# Patient Record
Sex: Female | Born: 1935 | Race: White | Hispanic: No | Smoking: Never smoker
Health system: Southern US, Community
[De-identification: ages and names within clinical notes are randomized; demographics above are authoritative.]

## PROBLEM LIST (undated history)

## (undated) DIAGNOSIS — C801 Malignant (primary) neoplasm, unspecified: Secondary | ICD-10-CM

## (undated) DIAGNOSIS — R600 Localized edema: Secondary | ICD-10-CM

## (undated) DIAGNOSIS — I38 Endocarditis, valve unspecified: Secondary | ICD-10-CM

## (undated) DIAGNOSIS — I2510 Atherosclerotic heart disease of native coronary artery without angina pectoris: Secondary | ICD-10-CM

## (undated) DIAGNOSIS — M1990 Unspecified osteoarthritis, unspecified site: Secondary | ICD-10-CM

## (undated) DIAGNOSIS — N189 Chronic kidney disease, unspecified: Secondary | ICD-10-CM

## (undated) DIAGNOSIS — I219 Acute myocardial infarction, unspecified: Secondary | ICD-10-CM

## (undated) DIAGNOSIS — E039 Hypothyroidism, unspecified: Secondary | ICD-10-CM

## (undated) DIAGNOSIS — Z889 Allergy status to unspecified drugs, medicaments and biological substances status: Secondary | ICD-10-CM

## (undated) DIAGNOSIS — M810 Age-related osteoporosis without current pathological fracture: Secondary | ICD-10-CM

## (undated) DIAGNOSIS — R011 Cardiac murmur, unspecified: Secondary | ICD-10-CM

## (undated) DIAGNOSIS — K769 Liver disease, unspecified: Secondary | ICD-10-CM

## (undated) DIAGNOSIS — H9190 Unspecified hearing loss, unspecified ear: Secondary | ICD-10-CM

## (undated) DIAGNOSIS — I499 Cardiac arrhythmia, unspecified: Secondary | ICD-10-CM

## (undated) DIAGNOSIS — I10 Essential (primary) hypertension: Secondary | ICD-10-CM

## (undated) DIAGNOSIS — E7800 Pure hypercholesterolemia, unspecified: Secondary | ICD-10-CM

## (undated) DIAGNOSIS — J189 Pneumonia, unspecified organism: Secondary | ICD-10-CM

## (undated) HISTORY — PX: CHOLECYSTECTOMY: SHX55

## (undated) HISTORY — PX: KNEE ARTHROSCOPY: SUR90

## (undated) HISTORY — PX: ABDOMINAL HYSTERECTOMY: SHX81

## (undated) HISTORY — PX: THYROIDECTOMY: SHX17

---

## 2004-03-11 ENCOUNTER — Other Ambulatory Visit: Payer: Self-pay

## 2004-03-11 ENCOUNTER — Inpatient Hospital Stay: Payer: Self-pay | Admitting: Internal Medicine

## 2004-08-20 ENCOUNTER — Ambulatory Visit: Payer: Self-pay | Admitting: Internal Medicine

## 2004-10-02 ENCOUNTER — Ambulatory Visit: Payer: Self-pay | Admitting: Gastroenterology

## 2004-11-13 ENCOUNTER — Emergency Department: Payer: Self-pay | Admitting: Emergency Medicine

## 2004-11-14 ENCOUNTER — Emergency Department: Payer: Self-pay | Admitting: Internal Medicine

## 2004-11-14 ENCOUNTER — Other Ambulatory Visit: Payer: Self-pay

## 2004-11-15 ENCOUNTER — Other Ambulatory Visit: Payer: Self-pay

## 2004-11-15 ENCOUNTER — Emergency Department: Payer: Self-pay | Admitting: Emergency Medicine

## 2005-03-25 ENCOUNTER — Ambulatory Visit: Payer: Self-pay

## 2005-09-08 ENCOUNTER — Ambulatory Visit: Payer: Self-pay | Admitting: Internal Medicine

## 2005-09-18 ENCOUNTER — Ambulatory Visit: Payer: Self-pay | Admitting: General Practice

## 2006-02-02 ENCOUNTER — Ambulatory Visit: Payer: Self-pay | Admitting: Internal Medicine

## 2006-03-26 ENCOUNTER — Ambulatory Visit: Payer: Self-pay | Admitting: General Surgery

## 2006-03-26 ENCOUNTER — Other Ambulatory Visit: Payer: Self-pay

## 2006-04-02 ENCOUNTER — Ambulatory Visit: Payer: Self-pay | Admitting: General Surgery

## 2006-07-27 ENCOUNTER — Other Ambulatory Visit: Payer: Self-pay

## 2006-07-27 ENCOUNTER — Emergency Department: Payer: Self-pay | Admitting: Emergency Medicine

## 2006-08-16 ENCOUNTER — Ambulatory Visit: Payer: Self-pay | Admitting: Internal Medicine

## 2006-09-28 ENCOUNTER — Ambulatory Visit: Payer: Self-pay | Admitting: Internal Medicine

## 2007-11-03 ENCOUNTER — Ambulatory Visit: Payer: Self-pay | Admitting: Internal Medicine

## 2007-11-30 ENCOUNTER — Emergency Department: Payer: Self-pay | Admitting: Emergency Medicine

## 2007-11-30 ENCOUNTER — Other Ambulatory Visit: Payer: Self-pay

## 2008-03-27 ENCOUNTER — Ambulatory Visit: Payer: Self-pay | Admitting: Internal Medicine

## 2008-06-11 ENCOUNTER — Emergency Department: Payer: Self-pay | Admitting: Emergency Medicine

## 2008-11-08 ENCOUNTER — Ambulatory Visit: Payer: Self-pay | Admitting: Internal Medicine

## 2009-06-12 ENCOUNTER — Ambulatory Visit: Payer: Self-pay | Admitting: Internal Medicine

## 2009-11-27 ENCOUNTER — Ambulatory Visit: Payer: Self-pay | Admitting: Internal Medicine

## 2010-11-28 ENCOUNTER — Ambulatory Visit: Payer: Self-pay | Admitting: Internal Medicine

## 2011-01-27 ENCOUNTER — Ambulatory Visit: Payer: Self-pay | Admitting: Internal Medicine

## 2011-03-05 ENCOUNTER — Observation Stay: Payer: Self-pay | Admitting: Internal Medicine

## 2012-01-28 ENCOUNTER — Ambulatory Visit: Payer: Self-pay | Admitting: Internal Medicine

## 2012-12-09 ENCOUNTER — Emergency Department: Payer: Self-pay | Admitting: Emergency Medicine

## 2012-12-09 LAB — COMPREHENSIVE METABOLIC PANEL
Alkaline Phosphatase: 111 U/L (ref 50–136)
Anion Gap: 7 (ref 7–16)
BUN: 12 mg/dL (ref 7–18)
Bilirubin,Total: 1.8 mg/dL — ABNORMAL HIGH (ref 0.2–1.0)
Calcium, Total: 9.4 mg/dL (ref 8.5–10.1)
Creatinine: 1.11 mg/dL (ref 0.60–1.30)
EGFR (African American): 55 — ABNORMAL LOW
Glucose: 105 mg/dL — ABNORMAL HIGH (ref 65–99)
Osmolality: 276 (ref 275–301)
Potassium: 3.9 mmol/L (ref 3.5–5.1)
SGOT(AST): 97 U/L — ABNORMAL HIGH (ref 15–37)
Sodium: 138 mmol/L (ref 136–145)
Total Protein: 7.9 g/dL (ref 6.4–8.2)

## 2012-12-09 LAB — CBC
HGB: 13.7 g/dL (ref 12.0–16.0)
MCH: 35 pg — ABNORMAL HIGH (ref 26.0–34.0)
MCHC: 34.2 g/dL (ref 32.0–36.0)
RBC: 3.92 10*6/uL (ref 3.80–5.20)
RDW: 14.6 % — ABNORMAL HIGH (ref 11.5–14.5)
WBC: 6.2 10*3/uL (ref 3.6–11.0)

## 2013-01-11 ENCOUNTER — Ambulatory Visit: Payer: Self-pay | Admitting: Internal Medicine

## 2013-01-30 ENCOUNTER — Ambulatory Visit: Payer: Self-pay | Admitting: Internal Medicine

## 2013-01-31 ENCOUNTER — Ambulatory Visit: Payer: Self-pay | Admitting: Internal Medicine

## 2014-07-18 ENCOUNTER — Emergency Department
Admit: 2014-07-18 | Disposition: A | Discharge status: Home / Self Care | Payer: Self-pay | Admitting: Emergency Medicine

## 2014-07-24 ENCOUNTER — Ambulatory Visit
Admit: 2014-07-24 | Disposition: A | Discharge status: Home / Self Care | Payer: Self-pay | Attending: Physician Assistant | Admitting: Physician Assistant

## 2015-01-08 ENCOUNTER — Other Ambulatory Visit: Payer: Self-pay | Admitting: Internal Medicine

## 2015-01-08 DIAGNOSIS — Z1231 Encounter for screening mammogram for malignant neoplasm of breast: Secondary | ICD-10-CM

## 2015-01-17 ENCOUNTER — Ambulatory Visit
Admission: RE | Admit: 2015-01-17 | Discharge: 2015-01-17 | Disposition: A | Discharge status: Home / Self Care | Payer: Medicare Other | Source: Ambulatory Visit | Attending: Internal Medicine | Admitting: Internal Medicine

## 2015-01-17 ENCOUNTER — Other Ambulatory Visit: Payer: Self-pay | Admitting: Internal Medicine

## 2015-01-17 DIAGNOSIS — Z1231 Encounter for screening mammogram for malignant neoplasm of breast: Secondary | ICD-10-CM

## 2015-09-06 ENCOUNTER — Other Ambulatory Visit
Admission: RE | Admit: 2015-09-06 | Discharge: 2015-09-06 | Disposition: A | Discharge status: Home / Self Care | Payer: Medicare Other | Source: Ambulatory Visit | Attending: Ophthalmology | Admitting: Ophthalmology

## 2015-09-09 ENCOUNTER — Encounter: Payer: Self-pay | Admitting: *Deleted

## 2015-09-09 LAB — MISC LABCORP TEST (SEND OUT): LABCORP TEST CODE: 602669

## 2015-09-10 NOTE — Pre-Procedure Instructions (Signed)
NEGATIVE RAST TEST. OR NOTIFIED

## 2015-09-12 ENCOUNTER — Ambulatory Visit
Admission: RE | Admit: 2015-09-12 | Discharge: 2015-09-12 | Disposition: A | Discharge status: Home / Self Care | Payer: Medicare Other | Source: Ambulatory Visit | Attending: Ophthalmology | Admitting: Ophthalmology

## 2015-09-12 ENCOUNTER — Ambulatory Visit: Payer: Medicare Other | Admitting: Anesthesiology

## 2015-09-12 ENCOUNTER — Encounter: Payer: Self-pay | Admitting: *Deleted

## 2015-09-12 ENCOUNTER — Encounter
Admission: RE | Disposition: A | Discharge status: Home / Self Care | Payer: Self-pay | Source: Ambulatory Visit | Attending: Ophthalmology

## 2015-09-12 DIAGNOSIS — I10 Essential (primary) hypertension: Secondary | ICD-10-CM | POA: Insufficient documentation

## 2015-09-12 DIAGNOSIS — M810 Age-related osteoporosis without current pathological fracture: Secondary | ICD-10-CM | POA: Diagnosis not present

## 2015-09-12 DIAGNOSIS — H2512 Age-related nuclear cataract, left eye: Secondary | ICD-10-CM | POA: Insufficient documentation

## 2015-09-12 DIAGNOSIS — E7800 Pure hypercholesterolemia, unspecified: Secondary | ICD-10-CM | POA: Insufficient documentation

## 2015-09-12 DIAGNOSIS — H9190 Unspecified hearing loss, unspecified ear: Secondary | ICD-10-CM | POA: Insufficient documentation

## 2015-09-12 DIAGNOSIS — E039 Hypothyroidism, unspecified: Secondary | ICD-10-CM | POA: Insufficient documentation

## 2015-09-12 DIAGNOSIS — I252 Old myocardial infarction: Secondary | ICD-10-CM | POA: Diagnosis not present

## 2015-09-12 HISTORY — DX: Pure hypercholesterolemia, unspecified: E78.00

## 2015-09-12 HISTORY — DX: Allergy status to unspecified drugs, medicaments and biological substances: Z88.9

## 2015-09-12 HISTORY — DX: Pneumonia, unspecified organism: J18.9

## 2015-09-12 HISTORY — DX: Endocarditis, valve unspecified: I38

## 2015-09-12 HISTORY — DX: Essential (primary) hypertension: I10

## 2015-09-12 HISTORY — DX: Cardiac arrhythmia, unspecified: I49.9

## 2015-09-12 HISTORY — DX: Liver disease, unspecified: K76.9

## 2015-09-12 HISTORY — DX: Unspecified hearing loss, unspecified ear: H91.90

## 2015-09-12 HISTORY — PX: CATARACT EXTRACTION W/PHACO: SHX586

## 2015-09-12 HISTORY — DX: Acute myocardial infarction, unspecified: I21.9

## 2015-09-12 HISTORY — DX: Age-related osteoporosis without current pathological fracture: M81.0

## 2015-09-12 HISTORY — DX: Hypothyroidism, unspecified: E03.9

## 2015-09-12 SURGERY — PHACOEMULSIFICATION, CATARACT, WITH IOL INSERTION
Anesthesia: Monitor Anesthesia Care | Site: Eye | Laterality: Left | Wound class: Clean

## 2015-09-12 MED ORDER — NA CHONDROIT SULF-NA HYALURON 40-17 MG/ML IO SOLN
INTRAOCULAR | Status: DC | PRN
  Administered 2015-09-12: 1 mL via INTRAOCULAR

## 2015-09-12 MED ORDER — LIDOCAINE HCL 2 % EX GEL
1.0000 "application " | Freq: Once | CUTANEOUS | Status: AC
  Administered 2015-09-12: 1

## 2015-09-12 MED ORDER — FENTANYL CITRATE (PF) 100 MCG/2ML IJ SOLN
INTRAMUSCULAR | Status: DC | PRN
  Administered 2015-09-12 (×2): 25 ug via INTRAVENOUS

## 2015-09-12 MED ORDER — PHENYLEPHRINE HCL 10 % OP SOLN
OPHTHALMIC | Status: AC
  Administered 2015-09-12: 1 [drp] via OPHTHALMIC
  Filled 2015-09-12: qty 5

## 2015-09-12 MED ORDER — MIDAZOLAM HCL 2 MG/2ML IJ SOLN
INTRAMUSCULAR | Status: DC | PRN
  Administered 2015-09-12: 1 mg via INTRAVENOUS

## 2015-09-12 MED ORDER — TETRACAINE HCL 0.5 % OP SOLN
1.0000 [drp] | Freq: Once | OPHTHALMIC | Status: AC
  Administered 2015-09-12: 1 [drp] via OPHTHALMIC

## 2015-09-12 MED ORDER — NA HYALUR & NA CHOND-NA HYALUR 0.55-0.5 ML IO KIT
PACK | INTRAOCULAR | Status: AC
  Filled 2015-09-12: qty 1.05

## 2015-09-12 MED ORDER — CEFUROXIME OPHTHALMIC INJECTION 1 MG/0.1 ML
INJECTION | OPHTHALMIC | Status: AC
  Filled 2015-09-12: qty 0.1

## 2015-09-12 MED ORDER — POLYMYXIN B-TRIMETHOPRIM 10000-0.1 UNIT/ML-% OP SOLN
OPHTHALMIC | Status: AC
  Administered 2015-09-12: 1 [drp] via OPHTHALMIC
  Filled 2015-09-12: qty 10

## 2015-09-12 MED ORDER — SODIUM HYALURONATE 23 MG/ML IO SOLN
INTRAOCULAR | Status: AC
  Filled 2015-09-12: qty 0.6

## 2015-09-12 MED ORDER — EPINEPHRINE HCL 1 MG/ML IJ SOLN
INTRAOCULAR | Status: DC | PRN
  Administered 2015-09-12: 250 mL via OPHTHALMIC

## 2015-09-12 MED ORDER — EPINEPHRINE HCL 1 MG/ML IJ SOLN
INTRAMUSCULAR | Status: AC
  Filled 2015-09-12: qty 1

## 2015-09-12 MED ORDER — POLYMYXIN B-TRIMETHOPRIM 10000-0.1 UNIT/ML-% OP SOLN
1.0000 [drp] | OPHTHALMIC | Status: AC
  Administered 2015-09-12 (×3): 1 [drp] via OPHTHALMIC

## 2015-09-12 MED ORDER — CYCLOPENTOLATE HCL 2 % OP SOLN
OPHTHALMIC | Status: AC
  Administered 2015-09-12: 1 [drp] via OPHTHALMIC
  Filled 2015-09-12: qty 2

## 2015-09-12 MED ORDER — SODIUM HYALURONATE 10 MG/ML IO SOLN
INTRAOCULAR | Status: DC | PRN
  Administered 2015-09-12: 0.85 mL via INTRAOCULAR

## 2015-09-12 MED ORDER — LIDOCAINE HCL (PF) 4 % IJ SOLN
INTRAMUSCULAR | Status: AC
Start: 2015-09-12 — End: 2015-09-12
  Filled 2015-09-12: qty 5

## 2015-09-12 MED ORDER — SODIUM HYALURONATE 10 MG/ML IO SOLN
INTRAOCULAR | Status: AC
  Filled 2015-09-12: qty 0.85

## 2015-09-12 MED ORDER — SODIUM HYALURONATE 23 MG/ML IO SOLN
INTRAOCULAR | Status: DC | PRN
  Administered 2015-09-12: 0.6 mL via INTRAOCULAR

## 2015-09-12 MED ORDER — LIDOCAINE HCL 3.5 % OP GEL
OPHTHALMIC | Status: AC
  Administered 2015-09-12: 09:00:00
  Filled 2015-09-12: qty 1

## 2015-09-12 MED ORDER — LIDOCAINE HCL (PF) 4 % IJ SOLN
INTRAOCULAR | Status: DC | PRN
  Administered 2015-09-12: 4 mL via OPHTHALMIC

## 2015-09-12 MED ORDER — SODIUM CHLORIDE 0.9 % IV SOLN
INTRAVENOUS | Status: DC
  Administered 2015-09-12: 08:00:00 via INTRAVENOUS

## 2015-09-12 MED ORDER — POVIDONE-IODINE 5 % OP SOLN
OPHTHALMIC | Status: AC
  Administered 2015-09-12: 1 via OPHTHALMIC
  Filled 2015-09-12: qty 30

## 2015-09-12 MED ORDER — POLYMYXIN B-TRIMETHOPRIM 10000-0.1 UNIT/ML-% OP SOLN
OPHTHALMIC | Status: DC | PRN
  Administered 2015-09-12: 2 [drp] via OPHTHALMIC

## 2015-09-12 MED ORDER — PHENYLEPHRINE HCL 10 % OP SOLN
1.0000 [drp] | OPHTHALMIC | Status: AC
  Administered 2015-09-12 (×4): 1 [drp] via OPHTHALMIC

## 2015-09-12 MED ORDER — CYCLOPENTOLATE HCL 2 % OP SOLN
1.0000 [drp] | OPHTHALMIC | Status: AC
  Administered 2015-09-12 (×4): 1 [drp] via OPHTHALMIC

## 2015-09-12 MED ORDER — POVIDONE-IODINE 5 % OP SOLN
1.0000 "application " | Freq: Once | OPHTHALMIC | Status: AC
  Administered 2015-09-12: 1 via OPHTHALMIC

## 2015-09-12 MED ORDER — TETRACAINE HCL 0.5 % OP SOLN
OPHTHALMIC | Status: AC
  Administered 2015-09-12: 1 [drp] via OPHTHALMIC
  Filled 2015-09-12: qty 2

## 2015-09-12 SURGICAL SUPPLY — 23 items
CANNULA ANT/CHMB 27G (MISCELLANEOUS) ×1 IMPLANT
CANNULA ANT/CHMB 27GA (MISCELLANEOUS) ×3 IMPLANT
CUP MEDICINE 2OZ PLAST GRAD ST (MISCELLANEOUS) ×3 IMPLANT
GLOVE BIO SURGEON STRL SZ8 (GLOVE) ×3 IMPLANT
GLOVE BIOGEL M 6.5 STRL (GLOVE) ×3 IMPLANT
GLOVE SURG LX 7.5 STRW (GLOVE) ×2
GLOVE SURG LX STRL 7.5 STRW (GLOVE) ×1 IMPLANT
GOWN STRL REUS W/ TWL LRG LVL3 (GOWN DISPOSABLE) ×2 IMPLANT
GOWN STRL REUS W/TWL LRG LVL3 (GOWN DISPOSABLE) ×6
LENS IOL ACRSF IQ PC 23.5 (Intraocular Lens) IMPLANT
LENS IOL ACRYSOF IQ POST 23.5 (Intraocular Lens) ×3 IMPLANT
PACK CATARACT (MISCELLANEOUS) ×3 IMPLANT
PACK CATARACT BRASINGTON LX (MISCELLANEOUS) ×3 IMPLANT
PACK EYE AFTER SURG (MISCELLANEOUS) ×3 IMPLANT
SOL BSS BAG (MISCELLANEOUS) ×3
SOL PREP PVP 2OZ (MISCELLANEOUS) ×3
SOLUTION BSS BAG (MISCELLANEOUS) ×1 IMPLANT
SOLUTION PREP PVP 2OZ (MISCELLANEOUS) ×1 IMPLANT
SYR 3ML LL SCALE MARK (SYRINGE) ×3 IMPLANT
SYR 5ML LL (SYRINGE) ×3 IMPLANT
SYR TB 1ML 27GX1/2 LL (SYRINGE) ×3 IMPLANT
WATER STERILE IRR 1000ML POUR (IV SOLUTION) ×3 IMPLANT
WIPE NON LINTING 3.25X3.25 (MISCELLANEOUS) ×3 IMPLANT

## 2015-09-12 NOTE — H&P (Signed)
  The History and Physical notes are on paper, have been signed, and are to be scanned. The patient remains stable and unchanged from the H&P.   Previous H&P reviewed, patient examined, and there are no changes.  Desiree Koch 09/12/2015 9:05 AM

## 2015-09-12 NOTE — Discharge Instructions (Signed)
Eye Surgery Discharge Instructions  Expect mild scratchy sensation or mild soreness. DO NOT RUB YOUR EYE!  The day of surgery:  Minimal physical activity, but bed rest is not required  No reading, computer work, or close hand work  No bending, lifting, or straining.  May watch TV  For 24 hours:  No driving, legal decisions, or alcoholic beverages  Safety precautions  Eat anything you prefer: It is better to start with liquids, then soup then solid foods.  _____ Eye patch should be worn until postoperative exam tomorrow.  ____ Solar shield eyeglasses should be worn for comfort in the sunlight/patch while sleeping  Resume all regular medications including aspirin or Coumadin if these were discontinued prior to surgery. You may shower, bathe, shave, or wash your hair. Tylenol may be taken for mild discomfort.  Call your doctor if you experience significant pain, nausea, or vomiting, fever > 101 or other signs of infection. [phone] or [phone] Specific instructions:  Follow-up Information    Follow up with Benay Pillow, MD In 1 day.   Specialty:  Ophthalmology   Why:  June 16 at 10:30am   Contact information:   [address] [place] [address] [phone]

## 2015-09-12 NOTE — Anesthesia Postprocedure Evaluation (Signed)
Anesthesia Post Note  Patient: BREALYN FOERST  Procedure(s) Performed: Procedure(s) (LRB): CATARACT EXTRACTION PHACO AND INTRAOCULAR LENS PLACEMENT (IOC) (Left)  Patient location during evaluation: PACU Anesthesia Type: MAC Level of consciousness: awake and alert Pain management: pain level controlled Vital Signs Assessment: post-procedure vital signs reviewed and stable Respiratory status: spontaneous breathing, nonlabored ventilation, respiratory function stable and patient connected to nasal cannula oxygen Cardiovascular status: blood pressure returned to baseline and stable Postop Assessment: no signs of nausea or vomiting Anesthetic complications: no    Last Vitals:  Filed Vitals:   09/12/15 0953 09/12/15 0955  BP: 170/64 170/64  Pulse: 45   Temp: 36.6 C   Resp: 18     Last Pain:  Filed Vitals:   09/12/15 1012  PainSc: 2                  Martha Clan

## 2015-09-12 NOTE — Anesthesia Preprocedure Evaluation (Signed)
Anesthesia Evaluation  Patient identified by MRN, date of birth, ID band Patient awake    Reviewed: Allergy & Precautions, H&P , NPO status , Patient's Chart, lab work & pertinent test results, reviewed documented beta blocker date and time   History of Anesthesia Complications Negative for: history of anesthetic complications  Airway Mallampati: II  TM Distance: >3 FB Neck ROM: full    Dental no notable dental hx. (+) Edentulous Upper, Upper Dentures   Pulmonary neg pulmonary ROS,    Pulmonary exam normal breath sounds clear to auscultation       Cardiovascular Exercise Tolerance: Good hypertension, (-) angina+ CAD, + Past MI and +CHF  (-) Cardiac Stents and (-) CABG Normal cardiovascular exam+ dysrhythmias (LBBB) Atrial Fibrillation + Valvular Problems/Murmurs AI  Rhythm:regular Rate:Normal     Neuro/Psych negative neurological ROS  negative psych ROS   GI/Hepatic negative GI ROS, (+) Cirrhosis       ,   Endo/Other  neg diabetesHypothyroidism   Renal/GU negative Renal ROS  negative genitourinary   Musculoskeletal   Abdominal   Peds  Hematology negative hematology ROS (+)   Anesthesia Other Findings Past Medical History:   Pneumonia                                                    Cardiac arrhythmia                                           Hypertension                                                 Myocardial infarction ([place])                                  Leaky heart valve                                            Liver damage                                                 Multiple allergies                                           HOH (hard of hearing)                                        Hypercholesteremia  Osteoporosis                                                 Hypothyroidism                                                Reproductive/Obstetrics negative OB ROS                             Anesthesia Physical Anesthesia Plan  ASA: III  Anesthesia Plan: MAC   Post-op Pain Management:    Induction:   Airway Management Planned:   Additional Equipment:   Intra-op Plan:   Post-operative Plan:   Informed Consent: I have reviewed the patients History and Physical, chart, labs and discussed the procedure including the risks, benefits and alternatives for the proposed anesthesia with the patient or authorized representative who has indicated his/her understanding and acceptance.   Dental Advisory Given  Plan Discussed with: Anesthesiologist, CRNA and Surgeon  Anesthesia Plan Comments:         Anesthesia Quick Evaluation

## 2015-09-12 NOTE — Op Note (Signed)
OPERATIVE NOTE  Desiree Koch [id number] 09/12/2015   PREOPERATIVE DIAGNOSIS:  Nuclear sclerotic cataract left eye.  H25.12   POSTOPERATIVE DIAGNOSIS:    Nuclear sclerotic cataract left eye.     PROCEDURE:  Phacoemusification with posterior chamber intraocular lens placement of the left eye   LENS:   Implant Name Type Inv. Item Serial No. Manufacturer Lot No. LRB No. Used  IMPLANT LENS - [id number] Intraocular Lens IMPLANT LENS [id number] ALCON   Left 1       [id number] 23.5   ULTRASOUND TIME: 1 minutes 02 seconds.  CDE 9.45   SURGEON:  Benay Pillow, MD, MPH   ANESTHESIA:  Topical with tetracaine drops and 2% Xylocaine jelly, augmented with 1% preservative-free intracameral lidocaine.   COMPLICATIONS:  None.  This procedure was latex free per the patient request, despite a negative RAS test.   DESCRIPTION OF PROCEDURE:  The patient was identified in the holding room and transported to the operating room and placed in the supine position under the operating microscope.  The left eye was identified as the operative eye and it was prepped and draped in the usual sterile ophthalmic fashion.   A 1.0 millimeter clear-corneal paracentesis was made at the 5:00 position. 0.5 ml of preservative-free 1% lidocaine with epinephrine was injected into the anterior chamber.  The anterior chamber was filled with Healon 5 viscoelastic.  A 2.4 millimeter keratome was used to make a near-clear corneal incision at the 2:00 position.  A curvilinear capsulorrhexis was made with a cystotome and capsulorrhexis forceps.  Balanced salt solution was used to hydrodissect and hydrodelineate the nucleus.   Phacoemulsification was then used in stop and chop fashion to remove the lens nucleus and epinucleus.  The remaining cortex was then removed using the irrigation and aspiration handpiece. Healon was then placed into the capsular bag to distend it for lens placement.  A lens was then injected into the capsular  bag.  The remaining viscoelastic was aspirated.   Wounds were hydrated with balanced salt solution.  The anterior chamber was inflated to a physiologic pressure with balanced salt solution.   Intracameral antibiotics were not\ injected into the eye due to the patients multiple allergies.  No wound leaks were noted.  Topical polytrim drops were applied to the eye.  The patient was taken to the recovery room in stable condition without complications of anesthesia or surgery  Benay Pillow 09/12/2015, 9:53 AM

## 2015-09-12 NOTE — Transfer of Care (Signed)
Immediate Anesthesia Transfer of Care Note  Patient: Desiree Koch  Procedure(s) Performed: Procedure(s) with comments: CATARACT EXTRACTION PHACO AND INTRAOCULAR LENS PLACEMENT (IOC) (Left) - Lot# [id number] H [place]: 01:02.0 AP%:15.3 CDE: 9.45  Patient Location: PACU and Short Stay  Anesthesia Type:MAC  Level of Consciousness: awake, alert  and oriented  Airway & Oxygen Therapy: Patient Spontanous Breathing  Post-op Assessment: Report given to RN and Post -op Vital signs reviewed and stable  Post vital signs: Reviewed and stable  Last Vitals:  Filed Vitals:   09/12/15 0748  BP: 162/55  Pulse: 49  Temp: 36.6 C  Resp: 18    Last Pain:  Filed Vitals:   09/12/15 0749  PainSc: 2          Complications: No apparent anesthesia complications

## 2015-10-31 ENCOUNTER — Ambulatory Visit
Admission: RE | Admit: 2015-10-31 | Discharge: 2015-10-31 | Disposition: A | Discharge status: Home / Self Care | Payer: Medicare Other | Source: Ambulatory Visit | Attending: Ophthalmology | Admitting: Ophthalmology

## 2015-10-31 ENCOUNTER — Encounter: Payer: Self-pay | Admitting: Anesthesiology

## 2015-10-31 ENCOUNTER — Ambulatory Visit: Payer: Medicare Other | Admitting: Certified Registered Nurse Anesthetist

## 2015-10-31 ENCOUNTER — Encounter
Admission: RE | Disposition: A | Discharge status: Home / Self Care | Payer: Self-pay | Source: Ambulatory Visit | Attending: Ophthalmology

## 2015-10-31 DIAGNOSIS — E039 Hypothyroidism, unspecified: Secondary | ICD-10-CM | POA: Diagnosis not present

## 2015-10-31 DIAGNOSIS — Z880 Allergy status to penicillin: Secondary | ICD-10-CM | POA: Insufficient documentation

## 2015-10-31 DIAGNOSIS — M1990 Unspecified osteoarthritis, unspecified site: Secondary | ICD-10-CM | POA: Insufficient documentation

## 2015-10-31 DIAGNOSIS — I10 Essential (primary) hypertension: Secondary | ICD-10-CM | POA: Insufficient documentation

## 2015-10-31 DIAGNOSIS — Z91040 Latex allergy status: Secondary | ICD-10-CM | POA: Diagnosis not present

## 2015-10-31 DIAGNOSIS — I4891 Unspecified atrial fibrillation: Secondary | ICD-10-CM | POA: Insufficient documentation

## 2015-10-31 DIAGNOSIS — Z888 Allergy status to other drugs, medicaments and biological substances status: Secondary | ICD-10-CM | POA: Diagnosis not present

## 2015-10-31 DIAGNOSIS — Z9842 Cataract extraction status, left eye: Secondary | ICD-10-CM | POA: Insufficient documentation

## 2015-10-31 DIAGNOSIS — Z9889 Other specified postprocedural states: Secondary | ICD-10-CM | POA: Insufficient documentation

## 2015-10-31 DIAGNOSIS — R001 Bradycardia, unspecified: Secondary | ICD-10-CM

## 2015-10-31 DIAGNOSIS — I38 Endocarditis, valve unspecified: Secondary | ICD-10-CM | POA: Diagnosis not present

## 2015-10-31 DIAGNOSIS — I252 Old myocardial infarction: Secondary | ICD-10-CM | POA: Insufficient documentation

## 2015-10-31 DIAGNOSIS — K7469 Other cirrhosis of liver: Secondary | ICD-10-CM | POA: Diagnosis not present

## 2015-10-31 DIAGNOSIS — Z882 Allergy status to sulfonamides status: Secondary | ICD-10-CM | POA: Insufficient documentation

## 2015-10-31 DIAGNOSIS — Z9049 Acquired absence of other specified parts of digestive tract: Secondary | ICD-10-CM | POA: Diagnosis not present

## 2015-10-31 DIAGNOSIS — E7800 Pure hypercholesterolemia, unspecified: Secondary | ICD-10-CM | POA: Diagnosis not present

## 2015-10-31 DIAGNOSIS — Z7951 Long term (current) use of inhaled steroids: Secondary | ICD-10-CM | POA: Diagnosis not present

## 2015-10-31 DIAGNOSIS — H9193 Unspecified hearing loss, bilateral: Secondary | ICD-10-CM | POA: Diagnosis not present

## 2015-10-31 DIAGNOSIS — M810 Age-related osteoporosis without current pathological fracture: Secondary | ICD-10-CM | POA: Insufficient documentation

## 2015-10-31 DIAGNOSIS — H2511 Age-related nuclear cataract, right eye: Secondary | ICD-10-CM | POA: Diagnosis not present

## 2015-10-31 DIAGNOSIS — Z886 Allergy status to analgesic agent status: Secondary | ICD-10-CM | POA: Diagnosis not present

## 2015-10-31 DIAGNOSIS — Z90710 Acquired absence of both cervix and uterus: Secondary | ICD-10-CM | POA: Diagnosis not present

## 2015-10-31 DIAGNOSIS — Z8701 Personal history of pneumonia (recurrent): Secondary | ICD-10-CM | POA: Diagnosis not present

## 2015-10-31 DIAGNOSIS — H269 Unspecified cataract: Secondary | ICD-10-CM | POA: Diagnosis present

## 2015-10-31 DIAGNOSIS — Z79899 Other long term (current) drug therapy: Secondary | ICD-10-CM | POA: Insufficient documentation

## 2015-10-31 HISTORY — PX: CATARACT EXTRACTION W/PHACO: SHX586

## 2015-10-31 HISTORY — DX: Unspecified osteoarthritis, unspecified site: M19.90

## 2015-10-31 SURGERY — PHACOEMULSIFICATION, CATARACT, WITH IOL INSERTION
Anesthesia: Monitor Anesthesia Care | Site: Eye | Laterality: Right | Wound class: Clean

## 2015-10-31 MED ORDER — EPINEPHRINE HCL 1 MG/ML IJ SOLN
INTRAMUSCULAR | Status: AC
  Filled 2015-10-31: qty 1

## 2015-10-31 MED ORDER — BSS IO SOLN
INTRAOCULAR | Status: DC | PRN
  Administered 2015-10-31: 15 mL via INTRAOCULAR

## 2015-10-31 MED ORDER — LIDOCAINE HCL (PF) 4 % IJ SOLN
INTRAMUSCULAR | Status: AC
Start: 2015-10-31 — End: 2015-10-31
  Filled 2015-10-31: qty 5

## 2015-10-31 MED ORDER — SODIUM CHLORIDE 0.9 % IV SOLN
INTRAVENOUS | Status: DC
  Administered 2015-10-31: 10:00:00 via INTRAVENOUS

## 2015-10-31 MED ORDER — POVIDONE-IODINE 5 % OP SOLN
1.0000 "application " | OPHTHALMIC | Status: AC | PRN
  Administered 2015-10-31: 1 via OPHTHALMIC

## 2015-10-31 MED ORDER — EPINEPHRINE HCL 1 MG/ML IJ SOLN
INTRAOCULAR | Status: DC | PRN
  Administered 2015-10-31: 12:00:00 via OPHTHALMIC

## 2015-10-31 MED ORDER — LIDOCAINE HCL 3.5 % OP GEL
1.0000 "application " | Freq: Once | OPHTHALMIC | Status: AC
  Administered 2015-10-31: 1 via OPHTHALMIC

## 2015-10-31 MED ORDER — CYCLOPENTOLATE HCL 2 % OP SOLN
1.0000 [drp] | OPHTHALMIC | Status: AC | PRN
  Administered 2015-10-31 (×4): 1 [drp] via OPHTHALMIC

## 2015-10-31 MED ORDER — SODIUM HYALURONATE 10 MG/ML IO SOLN
INTRAOCULAR | Status: DC | PRN
  Administered 2015-10-31: 0.85 mL via INTRAOCULAR

## 2015-10-31 MED ORDER — NA HYALUR & NA CHOND-NA HYALUR 0.55-0.5 ML IO KIT
PACK | INTRAOCULAR | Status: AC
  Filled 2015-10-31: qty 1.05

## 2015-10-31 MED ORDER — PHENYLEPHRINE HCL 10 % OP SOLN
1.0000 [drp] | OPHTHALMIC | Status: AC | PRN
  Administered 2015-10-31 (×4): 1 [drp] via OPHTHALMIC

## 2015-10-31 MED ORDER — EPINEPHRINE HCL 1 MG/ML IJ SOLN
INTRAMUSCULAR | Status: AC
  Filled 2015-10-31: qty 2

## 2015-10-31 MED ORDER — POLYMYXIN B-TRIMETHOPRIM 10000-0.1 UNIT/ML-% OP SOLN
1.0000 [drp] | OPHTHALMIC | Status: AC | PRN
  Administered 2015-10-31 (×3): 1 [drp] via OPHTHALMIC

## 2015-10-31 MED ORDER — FENTANYL CITRATE (PF) 100 MCG/2ML IJ SOLN
INTRAMUSCULAR | Status: DC | PRN
  Administered 2015-10-31: 50 ug via INTRAVENOUS

## 2015-10-31 MED ORDER — TETRACAINE HCL 0.5 % OP SOLN
1.0000 [drp] | OPHTHALMIC | Status: AC | PRN
  Administered 2015-10-31 (×2): 1 [drp] via OPHTHALMIC

## 2015-10-31 MED ORDER — LIDOCAINE HCL (PF) 4 % IJ SOLN
INTRAOCULAR | Status: DC | PRN
  Administered 2015-10-31: 12:00:00 via OPHTHALMIC

## 2015-10-31 MED ORDER — SODIUM HYALURONATE 23 MG/ML IO SOLN
INTRAOCULAR | Status: DC | PRN
  Administered 2015-10-31: 0.6 mL via INTRAOCULAR

## 2015-10-31 MED ORDER — CEFUROXIME OPHTHALMIC INJECTION 1 MG/0.1 ML
INJECTION | OPHTHALMIC | Status: AC
  Filled 2015-10-31: qty 0.1

## 2015-10-31 SURGICAL SUPPLY — 23 items
CANNULA ANT/CHMB 27G (MISCELLANEOUS) ×2 IMPLANT
CANNULA ANT/CHMB 27GA (MISCELLANEOUS) ×9 IMPLANT
CUP MEDICINE 2OZ PLAST GRAD ST (MISCELLANEOUS) ×3 IMPLANT
GLOVE BIO SURGEON STRL SZ8 (GLOVE) ×3 IMPLANT
GLOVE BIOGEL M 6.5 STRL (GLOVE) ×3 IMPLANT
GLOVE SURG LX 7.5 STRW (GLOVE) ×2
GLOVE SURG LX STRL 7.5 STRW (GLOVE) ×1 IMPLANT
GOWN STRL REUS W/ TWL LRG LVL3 (GOWN DISPOSABLE) ×2 IMPLANT
GOWN STRL REUS W/TWL LRG LVL3 (GOWN DISPOSABLE) ×6
LENS IOL ACRSF IQ PC 23.0 (Intraocular Lens) IMPLANT
LENS IOL ACRYSOF IQ POST 23.0 (Intraocular Lens) ×3 IMPLANT
PACK CATARACT (MISCELLANEOUS) ×3 IMPLANT
PACK CATARACT BRASINGTON LX (MISCELLANEOUS) ×3 IMPLANT
PACK EYE AFTER SURG (MISCELLANEOUS) ×3 IMPLANT
SOL BSS BAG (MISCELLANEOUS) ×3
SOL PREP PVP 2OZ (MISCELLANEOUS) ×3
SOLUTION BSS BAG (MISCELLANEOUS) ×1 IMPLANT
SOLUTION PREP PVP 2OZ (MISCELLANEOUS) ×1 IMPLANT
SYR 3ML LL SCALE MARK (SYRINGE) ×8 IMPLANT
SYR 5ML LL (SYRINGE) ×3 IMPLANT
SYR TB 1ML 27GX1/2 LL (SYRINGE) ×3 IMPLANT
WATER STERILE IRR 1000ML POUR (IV SOLUTION) ×3 IMPLANT
WIPE NON LINTING 3.25X3.25 (MISCELLANEOUS) ×3 IMPLANT

## 2015-10-31 NOTE — Anesthesia Procedure Notes (Signed)
Procedure Name: MAC Performed by: Tanis Burnley Pre-anesthesia Checklist: Patient identified, Emergency Drugs available, Suction available, Patient being monitored and Timeout performed Oxygen Delivery Method: Nasal cannula       

## 2015-10-31 NOTE — OR Nursing (Signed)
Dr Edison Pace in to see patient, md aware that patient hr varies from 30-60's. Anesthesia has also been in and assessed twice.

## 2015-10-31 NOTE — OR Nursing (Signed)
Anesthesia notified of patient's hr of 30bpm, dr Rosey Bath in to see patient no new orders at this time. Patient's hr 70

## 2015-10-31 NOTE — Transfer of Care (Signed)
Immediate Anesthesia Transfer of Care Note  Patient: DEVLYN KHATOON  Procedure(s) Performed: Procedure(s) with comments: CATARACT EXTRACTION PHACO AND INTRAOCULAR LENS PLACEMENT (IOC) (Right) - [place] 01:16.2 AP% 14.8 CDE 11.27 Fluid Pack Lot # [id number]  Patient Location: Short Stay  Anesthesia Type:MAC  Level of Consciousness: awake, alert  and oriented  Airway & Oxygen Therapy: Patient Spontanous Breathing  Post-op Assessment: Report given to RN and Post -op Vital signs reviewed and stable  Post vital signs: Reviewed and stable  Last Vitals:  Vitals:   10/31/15 0956 10/31/15 1129  BP:  (!) 166/82  Pulse: (!) 56 (!) 53  Resp:    Temp:      Last Pain:  Vitals:   10/31/15 0945  TempSrc: Tympanic         Complications: No apparent anesthesia complications

## 2015-10-31 NOTE — H&P (Signed)
  The History and Physical notes are on paper, have been signed, and are to be scanned. The patient remains stable and unchanged from the H&P.   Previous H&P reviewed, patient examined, and there are no changes.  Benay Pillow 10/31/2015 11:05 AM

## 2015-10-31 NOTE — OR Nursing (Signed)
clewis rn spoke with dr Rosey Bath about hr as low as 29, ekg ordered

## 2015-10-31 NOTE — Anesthesia Postprocedure Evaluation (Signed)
Anesthesia Post Note  Patient: Desiree Koch  Procedure(s) Performed: Procedure(s) (LRB): CATARACT EXTRACTION PHACO AND INTRAOCULAR LENS PLACEMENT (IOC) (Right)  Patient location during evaluation: Short Stay Anesthesia Type: MAC Level of consciousness: awake and alert and oriented Pain management: satisfactory to patient Vital Signs Assessment: post-procedure vital signs reviewed and stable Respiratory status: spontaneous breathing Cardiovascular status: blood pressure returned to baseline Anesthetic complications: no    Last Vitals:  Vitals:   10/31/15 0956 10/31/15 1129  BP:  (!) 166/82  Pulse: (!) 56 (!) 53  Resp:    Temp:      Last Pain:  Vitals:   10/31/15 0945  TempSrc: Tympanic                 Blima Singer

## 2015-10-31 NOTE — Progress Notes (Signed)
Patient states she feels fine.   Patient wanted to go to bathroom, po intake wnl.  Patient denies any chest pain Denies any shob, denies any nausea.  Heart rate wnl. Patient states she is ready to discharge home.

## 2015-10-31 NOTE — Anesthesia Preprocedure Evaluation (Signed)
Anesthesia Evaluation  Patient identified by MRN, date of birth, ID band Patient awake    Reviewed: Allergy & Precautions, H&P , NPO status , Patient's Chart, lab work & pertinent test results, reviewed documented beta blocker date and time   History of Anesthesia Complications Negative for: history of anesthetic complications  Airway Mallampati: II  TM Distance: >3 FB Neck ROM: full    Dental no notable dental hx. (+) Edentulous Upper, Upper Dentures   Pulmonary neg pulmonary ROS,    Pulmonary exam normal breath sounds clear to auscultation       Cardiovascular Exercise Tolerance: Good hypertension, (-) angina+ CAD, + Past MI and +CHF  (-) Cardiac Stents and (-) CABG Normal cardiovascular exam+ dysrhythmias (LBBB) Atrial Fibrillation + Valvular Problems/Murmurs AI  Rhythm:regular Rate:Normal     Neuro/Psych negative neurological ROS  negative psych ROS   GI/Hepatic negative GI ROS, (+) Cirrhosis       ,   Endo/Other  neg diabetesHypothyroidism   Renal/GU negative Renal ROS  negative genitourinary   Musculoskeletal   Abdominal   Peds  Hematology negative hematology ROS (+)   Anesthesia Other Findings Past Medical History:   Pneumonia                                                    Cardiac arrhythmia                                           Hypertension                                                 Myocardial infarction ([place])                                  Leaky heart valve                                            Liver damage                                                 Multiple allergies                                           HOH (hard of hearing)                                        Hypercholesteremia  Osteoporosis                                                 Hypothyroidism                                                Reproductive/Obstetrics negative OB ROS                             Anesthesia Physical  Anesthesia Plan  ASA: III  Anesthesia Plan: MAC   Post-op Pain Management:    Induction:   Airway Management Planned:   Additional Equipment:   Intra-op Plan:   Post-operative Plan:   Informed Consent: I have reviewed the patients History and Physical, chart, labs and discussed the procedure including the risks, benefits and alternatives for the proposed anesthesia with the patient or authorized representative who has indicated his/her understanding and acceptance.   Dental Advisory Given  Plan Discussed with: Anesthesiologist, CRNA and Surgeon  Anesthesia Plan Comments:         Anesthesia Quick Evaluation

## 2015-10-31 NOTE — Op Note (Signed)
OPERATIVE NOTE  Desiree Koch [id number] 10/31/2015   PREOPERATIVE DIAGNOSIS:  Nuclear sclerotic cataract right eye.  H25.11   POSTOPERATIVE DIAGNOSIS:    Nuclear sclerotic cataract right eye.     PROCEDURE:  Phacoemusification with posterior chamber intraocular lens placement of the right eye   LENS:   Implant Name Type Inv. Item Serial No. Manufacturer Lot No. LRB No. Used  IMPLANT LENS - [id number] Intraocular Lens IMPLANT LENS [id number] ALCON   Right 1       [id number] 23.0   ULTRASOUND TIME: 1 minutes 16 seconds.  CDE 11.27   SURGEON:  Benay Pillow, MD, MPH  ANESTHESIOLOGIST: Anesthesiologist: Martha Clan, MD CRNA: Demetrius Charity, CRNA   ANESTHESIA:  Topical with tetracaine drops and 2% Xylocaine jelly, augmented with 1% preservative-free intracameral lidocaine.  ESTIMATED BLOOD LOSS: less than 1 mL.   COMPLICATIONS:  None.   DESCRIPTION OF PROCEDURE:  The patient was identified in the holding room and transported to the operating room and placed in the supine position under the operating microscope.  The right eye was identified as the operative eye and it was prepped and draped in the usual sterile ophthalmic fashion.   A 1.0 millimeter clear-corneal paracentesis was made at the 10:30 position. 0.5 ml of preservative-free 1% lidocaine with epinephrine was injected into the anterior chamber.  The anterior chamber was filled with Healon5 viscoelastic.  A 2.4 millimeter keratome was used to make a near-clear corneal incision at the 8:00 position.  A curvilinear capsulorrhexis was made with a cystotome and capsulorrhexis forceps.  The rhexis was relatively peripheral at 8:00, but this was inconsequential.  Balanced salt solution was used to hydrodissect and hydrodelineate the nucleus.   Phacoemulsification was then used in stop and chop fashion to remove the lens nucleus and epinucleus.  The remaining cortex was then removed using the irrigation and aspiration handpiece.  Healon was then placed into the capsular bag to distend it for lens placement.  A lens was then injected into the capsular bag.  The remaining viscoelastic was aspirated.   Wounds were hydrated with balanced salt solution.  The anterior chamber was inflated to a physiologic pressure with balanced salt solution.   No intracameral antibiotics were used.  Topical polytrim drops were applied.   No wound leaks were noted.  The patient was taken to the recovery room in stable condition without complications of anesthesia or surgery  Benay Pillow 10/31/2015, 12:26 PM

## 2015-10-31 NOTE — Discharge Instructions (Signed)
Eye Surgery Discharge Instructions  Expect mild scratchy sensation or mild soreness. DO NOT RUB YOUR EYE!  The day of surgery:  Minimal physical activity, but bed rest is not required  No reading, computer work, or close hand work  No bending, lifting, or straining.  May watch TV  For 24 hours:  No driving, legal decisions, or alcoholic beverages  Safety precautions  Eat anything you prefer: It is better to start with liquids, then soup then solid foods.  _____ Eye patch should be worn until postoperative exam tomorrow.  ____ Solar shield eyeglasses should be worn for comfort in the sunlight/patch while sleeping  Resume all regular medications including aspirin or Coumadin if these were discontinued prior to surgery. You may shower, bathe, shave, or wash your hair. Tylenol may be taken for mild discomfort.  Call your doctor if you experience significant pain, nausea, or vomiting, fever > 101 or other signs of infection. [phone] or [phone] Specific instructions:  Follow-up Information    Benay Pillow, MD .   Specialty:  Ophthalmology Why:  August 4 at 9:40am Contact information: [address] [place] [address] [phone]

## 2016-01-13 ENCOUNTER — Other Ambulatory Visit: Payer: Self-pay | Admitting: Internal Medicine

## 2016-01-13 DIAGNOSIS — Z1231 Encounter for screening mammogram for malignant neoplasm of breast: Secondary | ICD-10-CM

## 2016-02-13 ENCOUNTER — Ambulatory Visit
Admission: RE | Admit: 2016-02-13 | Discharge: 2016-02-13 | Disposition: A | Discharge status: Home / Self Care | Payer: Medicare Other | Source: Ambulatory Visit | Attending: Internal Medicine | Admitting: Internal Medicine

## 2016-02-13 DIAGNOSIS — Z1231 Encounter for screening mammogram for malignant neoplasm of breast: Secondary | ICD-10-CM | POA: Diagnosis not present

## 2016-03-30 DIAGNOSIS — C801 Malignant (primary) neoplasm, unspecified: Secondary | ICD-10-CM

## 2016-03-30 HISTORY — DX: Malignant (primary) neoplasm, unspecified: C80.1

## 2017-01-26 ENCOUNTER — Other Ambulatory Visit: Payer: Self-pay | Admitting: Internal Medicine

## 2017-01-26 DIAGNOSIS — K7030 Alcoholic cirrhosis of liver without ascites: Secondary | ICD-10-CM

## 2017-02-22 ENCOUNTER — Ambulatory Visit
Admission: RE | Admit: 2017-02-22 | Discharge: 2017-02-22 | Disposition: A | Discharge status: Home / Self Care | Payer: Medicare HMO | Source: Ambulatory Visit | Attending: Internal Medicine | Admitting: Internal Medicine

## 2017-02-22 DIAGNOSIS — Z9049 Acquired absence of other specified parts of digestive tract: Secondary | ICD-10-CM | POA: Insufficient documentation

## 2017-02-22 DIAGNOSIS — K7030 Alcoholic cirrhosis of liver without ascites: Secondary | ICD-10-CM | POA: Diagnosis not present

## 2017-03-24 ENCOUNTER — Other Ambulatory Visit: Payer: Self-pay | Admitting: Internal Medicine

## 2017-03-24 DIAGNOSIS — Z1231 Encounter for screening mammogram for malignant neoplasm of breast: Secondary | ICD-10-CM

## 2017-05-14 ENCOUNTER — Ambulatory Visit
Admission: RE | Admit: 2017-05-14 | Discharge: 2017-05-14 | Disposition: A | Discharge status: Home / Self Care | Payer: Medicare HMO | Source: Ambulatory Visit | Attending: Internal Medicine | Admitting: Internal Medicine

## 2017-05-14 DIAGNOSIS — Z1231 Encounter for screening mammogram for malignant neoplasm of breast: Secondary | ICD-10-CM

## 2017-05-14 HISTORY — DX: Malignant (primary) neoplasm, unspecified: C80.1

## 2017-06-03 ENCOUNTER — Other Ambulatory Visit: Payer: Self-pay | Admitting: Internal Medicine

## 2017-06-03 DIAGNOSIS — K7460 Unspecified cirrhosis of liver: Secondary | ICD-10-CM

## 2017-06-16 ENCOUNTER — Emergency Department: Payer: Medicare HMO

## 2017-06-16 ENCOUNTER — Other Ambulatory Visit: Payer: Self-pay

## 2017-06-16 ENCOUNTER — Encounter: Payer: Self-pay | Admitting: *Deleted

## 2017-06-16 ENCOUNTER — Inpatient Hospital Stay
Admission: EM | Admit: 2017-06-16 | Discharge: 2017-06-18 | DRG: 309 | Disposition: A | Discharge status: Home / Self Care | Payer: Medicare HMO | Attending: Family Medicine | Admitting: Family Medicine

## 2017-06-16 DIAGNOSIS — Z79890 Hormone replacement therapy (postmenopausal): Secondary | ICD-10-CM

## 2017-06-16 DIAGNOSIS — I252 Old myocardial infarction: Secondary | ICD-10-CM

## 2017-06-16 DIAGNOSIS — I10 Essential (primary) hypertension: Secondary | ICD-10-CM | POA: Diagnosis present

## 2017-06-16 DIAGNOSIS — E785 Hyperlipidemia, unspecified: Secondary | ICD-10-CM | POA: Diagnosis present

## 2017-06-16 DIAGNOSIS — R42 Dizziness and giddiness: Secondary | ICD-10-CM | POA: Diagnosis not present

## 2017-06-16 DIAGNOSIS — Z885 Allergy status to narcotic agent status: Secondary | ICD-10-CM

## 2017-06-16 DIAGNOSIS — E890 Postprocedural hypothyroidism: Secondary | ICD-10-CM | POA: Diagnosis present

## 2017-06-16 DIAGNOSIS — J329 Chronic sinusitis, unspecified: Secondary | ICD-10-CM | POA: Diagnosis present

## 2017-06-16 DIAGNOSIS — Z79899 Other long term (current) drug therapy: Secondary | ICD-10-CM

## 2017-06-16 DIAGNOSIS — R001 Bradycardia, unspecified: Principal | ICD-10-CM | POA: Diagnosis present

## 2017-06-16 DIAGNOSIS — Z91040 Latex allergy status: Secondary | ICD-10-CM

## 2017-06-16 DIAGNOSIS — Z888 Allergy status to other drugs, medicaments and biological substances status: Secondary | ICD-10-CM | POA: Diagnosis not present

## 2017-06-16 DIAGNOSIS — M810 Age-related osteoporosis without current pathological fracture: Secondary | ICD-10-CM | POA: Diagnosis present

## 2017-06-16 DIAGNOSIS — Z886 Allergy status to analgesic agent status: Secondary | ICD-10-CM

## 2017-06-16 DIAGNOSIS — N390 Urinary tract infection, site not specified: Secondary | ICD-10-CM | POA: Diagnosis present

## 2017-06-16 DIAGNOSIS — Z85828 Personal history of other malignant neoplasm of skin: Secondary | ICD-10-CM | POA: Diagnosis not present

## 2017-06-16 DIAGNOSIS — Z9049 Acquired absence of other specified parts of digestive tract: Secondary | ICD-10-CM

## 2017-06-16 DIAGNOSIS — Z881 Allergy status to other antibiotic agents status: Secondary | ICD-10-CM | POA: Diagnosis not present

## 2017-06-16 DIAGNOSIS — H9190 Unspecified hearing loss, unspecified ear: Secondary | ICD-10-CM | POA: Diagnosis present

## 2017-06-16 DIAGNOSIS — Z90710 Acquired absence of both cervix and uterus: Secondary | ICD-10-CM | POA: Diagnosis not present

## 2017-06-16 DIAGNOSIS — J302 Other seasonal allergic rhinitis: Secondary | ICD-10-CM | POA: Diagnosis present

## 2017-06-16 DIAGNOSIS — I493 Ventricular premature depolarization: Secondary | ICD-10-CM

## 2017-06-16 DIAGNOSIS — Z961 Presence of intraocular lens: Secondary | ICD-10-CM | POA: Diagnosis present

## 2017-06-16 DIAGNOSIS — Z880 Allergy status to penicillin: Secondary | ICD-10-CM

## 2017-06-16 DIAGNOSIS — I2510 Atherosclerotic heart disease of native coronary artery without angina pectoris: Secondary | ICD-10-CM | POA: Diagnosis present

## 2017-06-16 LAB — BASIC METABOLIC PANEL
Anion gap: 7 (ref 5–15)
BUN: 16 mg/dL (ref 6–20)
CALCIUM: 9.3 mg/dL (ref 8.9–10.3)
CO2: 26 mmol/L (ref 22–32)
Chloride: 105 mmol/L (ref 101–111)
Creatinine, Ser: 1.16 mg/dL — ABNORMAL HIGH (ref 0.44–1.00)
GFR calc Af Amer: 49 mL/min — ABNORMAL LOW (ref >60)
GFR calc non Af Amer: 42 mL/min — ABNORMAL LOW (ref >60)
GLUCOSE: 102 mg/dL — AB (ref 65–99)
Potassium: 4.5 mmol/L (ref 3.5–5.1)
SODIUM: 138 mmol/L (ref 135–145)

## 2017-06-16 LAB — URINALYSIS, COMPLETE (UACMP) WITH MICROSCOPIC
Bilirubin Urine: NEGATIVE
GLUCOSE, UA: NEGATIVE mg/dL
HGB URINE DIPSTICK: NEGATIVE
Ketones, ur: NEGATIVE mg/dL
Nitrite: NEGATIVE
PH: 5 (ref 5.0–8.0)
Protein, ur: NEGATIVE mg/dL
SPECIFIC GRAVITY, URINE: 1.004 — AB (ref 1.005–1.030)

## 2017-06-16 LAB — CBC
HCT: 36.1 % (ref 35.0–47.0)
Hemoglobin: 12 g/dL (ref 12.0–16.0)
MCH: 36 pg — ABNORMAL HIGH (ref 26.0–34.0)
MCHC: 33.3 g/dL (ref 32.0–36.0)
MCV: 108 fL — ABNORMAL HIGH (ref 80.0–100.0)
PLATELETS: 109 10*3/uL — AB (ref 150–440)
RBC: 3.35 MIL/uL — ABNORMAL LOW (ref 3.80–5.20)
RDW: 14.2 % (ref 11.5–14.5)
WBC: 5.9 10*3/uL (ref 3.6–11.0)

## 2017-06-16 LAB — TSH: TSH: 1.186 u[IU]/mL (ref 0.350–4.500)

## 2017-06-16 LAB — MAGNESIUM: MAGNESIUM: 1.9 mg/dL (ref 1.7–2.4)

## 2017-06-16 LAB — TROPONIN I

## 2017-06-16 MED ORDER — ENOXAPARIN SODIUM 40 MG/0.4ML ~~LOC~~ SOLN
40.0000 mg | SUBCUTANEOUS | Status: DC
  Administered 2017-06-16 – 2017-06-17 (×2): 40 mg via SUBCUTANEOUS
  Filled 2017-06-16 (×2): qty 0.4

## 2017-06-16 MED ORDER — GLUCOSAMINE 500 MG PO CAPS: 500.0000 mg | ORAL_CAPSULE | Freq: Every day | ORAL | Status: DC

## 2017-06-16 MED ORDER — LEVOTHYROXINE SODIUM 50 MCG PO TABS
75.0000 ug | ORAL_TABLET | Freq: Every day | ORAL | Status: DC
  Administered 2017-06-17 – 2017-06-18 (×2): 75 ug via ORAL
  Filled 2017-06-16 (×2): qty 1

## 2017-06-16 MED ORDER — ONDANSETRON HCL 4 MG/2ML IJ SOLN: 4.0000 mg | Freq: Four times a day (QID) | INTRAMUSCULAR | Status: DC | PRN

## 2017-06-16 MED ORDER — OCUVITE-LUTEIN PO CAPS
1.0000 | ORAL_CAPSULE | Freq: Every day | ORAL | Status: DC
  Administered 2017-06-17 – 2017-06-18 (×2): 1 via ORAL
  Filled 2017-06-16 (×2): qty 1

## 2017-06-16 MED ORDER — ACETAMINOPHEN 650 MG RE SUPP: 650.0000 mg | Freq: Four times a day (QID) | RECTAL | Status: DC | PRN

## 2017-06-16 MED ORDER — ONDANSETRON HCL 4 MG PO TABS: 4.0000 mg | ORAL_TABLET | Freq: Four times a day (QID) | ORAL | Status: DC | PRN

## 2017-06-16 MED ORDER — VITAMIN D 1000 UNITS PO TABS
2000.0000 [IU] | ORAL_TABLET | Freq: Every day | ORAL | Status: DC
  Administered 2017-06-17 – 2017-06-18 (×2): 2000 [IU] via ORAL
  Filled 2017-06-16 (×2): qty 2

## 2017-06-16 MED ORDER — ACETAMINOPHEN 325 MG PO TABS: 325.0000 mg | ORAL_TABLET | Freq: Four times a day (QID) | ORAL | Status: DC | PRN

## 2017-06-16 MED ORDER — LORATADINE 10 MG PO TABS
10.0000 mg | ORAL_TABLET | Freq: Every day | ORAL | Status: DC
  Administered 2017-06-17 – 2017-06-18 (×2): 10 mg via ORAL
  Filled 2017-06-16 (×2): qty 1

## 2017-06-16 MED ORDER — SODIUM CHLORIDE 0.9 % IV BOLUS (SEPSIS)
1000.0000 mL | Freq: Once | INTRAVENOUS | Status: AC
  Administered 2017-06-16: 1000 mL via INTRAVENOUS

## 2017-06-16 MED ORDER — FLUTICASONE PROPIONATE 50 MCG/ACT NA SUSP: 2.0000 | Freq: Every day | NASAL | Status: DC

## 2017-06-16 MED ORDER — IRBESARTAN 150 MG PO TABS
75.0000 mg | ORAL_TABLET | Freq: Every day | ORAL | Status: DC
  Administered 2017-06-17 – 2017-06-18 (×2): 75 mg via ORAL
  Filled 2017-06-16 (×2): qty 1

## 2017-06-16 MED ORDER — ACETAMINOPHEN 325 MG PO TABS: 650.0000 mg | ORAL_TABLET | Freq: Four times a day (QID) | ORAL | Status: DC | PRN

## 2017-06-16 MED ORDER — SODIUM CHLORIDE 0.9 % IV SOLN
1.0000 g | Freq: Once | INTRAVENOUS | Status: AC
  Administered 2017-06-16: 1 g via INTRAVENOUS
  Filled 2017-06-16: qty 10

## 2017-06-16 MED ORDER — SODIUM CHLORIDE 0.9 % IV SOLN
1.0000 g | INTRAVENOUS | Status: DC
  Administered 2017-06-17: 1 g via INTRAVENOUS
  Filled 2017-06-16 (×2): qty 10

## 2017-06-16 MED ORDER — SODIUM CHLORIDE 0.9 % IV SOLN
INTRAVENOUS | Status: DC
  Administered 2017-06-16 – 2017-06-17 (×2): via INTRAVENOUS

## 2017-06-16 NOTE — ED Triage Notes (Signed)
Pt sent to the er for eval of headache and dizziness.  Headache for -2-3 days   Pt denies chest pain or sob.  Pt alert.  Speech clear.

## 2017-06-16 NOTE — ED Provider Notes (Signed)
[facility] Emergency Department Provider Note  ____________________________________________  Time seen: Approximately 7:44 PM  I have reviewed the triage vital signs and the nursing notes.   HISTORY  Chief Complaint Headache and Dizziness    HPI Desiree Koch is a 84 y.o. female with a history of CAD status post MI, HTN, HL, presenting with dizziness.  The patient reports that yesterday she "urinated all day" without any dysuria or hematuria.  She had no associated nausea vomiting diarrhea fevers or chills.  This morning when she woke up as soon as she stood up she had a brief episode of room spinning dizziness that lasted several seconds and completely resolved.  Later in the morning she became dizzy again, drink plenty of fluid and felt better.  She denies any chest pain, shortness of breath, palpitations, changes in her medications or trauma.  Past Medical History:  Diagnosis Date  . Arthritis   . Cancer ([place]) 2018   skin ca  . Cardiac arrhythmia   . HOH (hard of hearing)   . Hypercholesteremia   . Hypertension   . Hypothyroidism   . Leaky heart valve   . Liver damage   . Multiple allergies   . Myocardial infarction ([place])   . Osteoporosis   . Pneumonia     There are no active problems to display for this patient.   Past Surgical History:  Procedure Laterality Date  . ABDOMINAL HYSTERECTOMY    . CATARACT EXTRACTION W/PHACO Left 09/12/2015   Procedure: CATARACT EXTRACTION PHACO AND INTRAOCULAR LENS PLACEMENT (IOC);  Surgeon: Eulogio Bear, MD;  Location: [facility] ORS;  Service: Ophthalmology;  Laterality: Left;  Lot# [id number] H [place]: 01:02.0 AP%:15.3 CDE: 9.45  . CATARACT EXTRACTION W/PHACO Right 10/31/2015   Procedure: CATARACT EXTRACTION PHACO AND INTRAOCULAR LENS PLACEMENT (IOC);  Surgeon: Eulogio Bear, MD;  Location: [facility] ORS;  Service: Ophthalmology;  Laterality: Right;  [place] 01:16.2 AP% 14.8 CDE 11.27 Fluid Pack Lot # [id number]  .  CESAREAN SECTION    . CHOLECYSTECTOMY    . KNEE ARTHROSCOPY    . THYROIDECTOMY      Current Outpatient Rx  . Order #: [id number] Class: Historical Med  . Order #: [id number] Class: Historical Med  . Order #: [id number] Class: Historical Med  . Order #: [id number] Class: Historical Med  . Order #: [id number] Class: Historical Med  . Order #: [id number] Class: Historical Med  . Order #: [id number] Class: Historical Med  . Order #: [id number] Class: Historical Med  . Order #: [id number] Class: Historical Med  . Order #: [id number] Class: Historical Med  . Order #: [id number] Class: Historical Med    Allergies Amlodipine; Clonidine derivatives; Norvasc [amlodipine besylate]; Aspirin; Celebrex [celecoxib]; Codeine; Propoxyphene; Latex; Penicillins; Ace inhibitors; Ciprofloxacin; Flexeril [cyclobenzaprine]; Hctz [hydrochlorothiazide]; Lisinopril; Meperidine and related; Pravastatin; and Sulfa antibiotics  Family History  Problem Relation Age of Onset  . Breast cancer Neg Hx     Social History Social History   Tobacco Use  . Smoking status: Never Smoker  . Smokeless tobacco: Never Used  Substance Use Topics  . Alcohol use: No  . Drug use: No    Review of Systems Constitutional: No fever/chills.  Positive dizziness without lightheadedness or syncope. Eyes: No visual changes. ENT: No sore throat. No congestion or rhinorrhea. Cardiovascular: Denies chest pain. Denies palpitations. Respiratory: Denies shortness of breath.  No cough. Gastrointestinal: No abdominal pain.  No nausea, no vomiting.  No diarrhea.  No constipation. Genitourinary: Negative for dysuria. Musculoskeletal: Negative for back pain. Skin: Negative for rash.  Neurological: Negative for headaches. No focal numbness, tingling or weakness.     ____________________________________________   PHYSICAL EXAM:  VITAL SIGNS: ED Triage Vitals  Enc Vitals Group     BP 06/16/17 1845 (!) 152/77     Pulse Rate 06/16/17 1845 (!) 59      Resp 06/16/17 1845 20     Temp 06/16/17 1845 98.4 F (36.9 C)     Temp Source 06/16/17 1845 Oral     SpO2 06/16/17 1845 99 %     Weight 06/16/17 1846 152 lb (68.9 kg)     Height 06/16/17 1846 5\' 1"  (1.549 m)     Head Circumference --      Peak Flow --      Pain Score 06/16/17 1846 2     Pain Loc --      Pain Edu? --      Excl. in [place]? --     Constitutional: Alert and oriented.  Chronically ill appearing and in no acute distress. Answers questions appropriately. Eyes: Conjunctivae are normal.  EOMI. No scleral icterus. Head: Atraumatic. Nose: No congestion/rhinnorhea. Mouth/Throat: Mucous membranes are moist.  Neck: No stridor.  Supple.  No JVD.  No meningismus. Cardiovascular: Slow rate, irregular rhythm. No murmurs, rubs or gallops.  Respiratory: Normal respiratory effort.  No accessory muscle use or retractions. Lungs CTAB.  No wheezes, rales or ronchi. Gastrointestinal: Soft, nontender and nondistended.  No guarding or rebound.  No peritoneal signs. Musculoskeletal: No LE edema. No ttp in the calves or palpable cords.  Negative Homan's sign. Neurologic: Alert and oriented 3. Speech is clear. Face and smile symmetric.  EOMI.  PERRLA.  Tongue is midline. No pronator drift. 5 out of 5 grip, biceps, triceps, hip flexors, plantar flexion and dorsiflexion. Normal sensation to light touch in the bilateral upper and lower extremities, and face. Normal finger-nose-finger. Skin:  Skin is warm, dry and intact. No rash noted. Psychiatric: Mood and affect are normal. Speech and behavior are normal.  Normal judgement.  ____________________________________________   LABS (all labs ordered are listed, but only abnormal results are displayed)  Labs Reviewed  BASIC METABOLIC PANEL - Abnormal; Notable for the following components:      Result Value   Glucose, Bld 102 (*)    Creatinine, Ser 1.16 (*)    GFR calc non Af Amer 42 (*)    GFR calc Af Amer 49 (*)    All other components within  normal limits  CBC - Abnormal; Notable for the following components:   RBC 3.35 (*)    MCV 108.0 (*)    MCH 36.0 (*)    Platelets 109 (*)    All other components within normal limits  TROPONIN I  URINALYSIS, COMPLETE (UACMP) WITH MICROSCOPIC  MAGNESIUM  TSH   ____________________________________________  EKG  ED ECG REPORT I, Eula Listen, the attending physician, personally viewed and interpreted this ECG.   Date: 06/16/2017  EKG Time: 1848  Rate: 73  Rhythm: normal sinus rhythm with frequent PVCs in a bigeminy pattern  Axis: Leftward  Intervals:first-degree A-V block And prolonged QTC  ST&T Change: No STEMI   This EKG was compared to prior and she does have a history of left bundle branch block  ____________________________________________  RADIOLOGY  No results found.  ____________________________________________   PROCEDURES  Procedure(s) performed: None  Procedures  Critical Care performed: No ____________________________________________   INITIAL IMPRESSION / ASSESSMENT AND PLAN / ED COURSE  Pertinent labs & imaging results that were  available during my care of the patient were reviewed by me and considered in my medical decision making (see chart for details).  84 y.o. female with several episodes of dizziness today.  Overall, the patient has frequent PVCs that are in a bigeminy pattern and occasionally becomes bradycardic in the emergency department.  This could be the cause of her symptoms.  We will also check a troponin and look for electrolyte causes of this including abnormal sodium, potassium or magnesium.  A TSH has also been ordered.  Given the patient's age and multiple risk factors, will also get a CT of the head to evaluate for CVA but I do not see any focal deficits on my examination.  The patient will require admission for further evaluation and treatment.  ----------------------------------------- 8:24 PM on  06/16/2017 -----------------------------------------  Patient CT head does not show any acute intracranial process.  Her electrolytes are reassuring although magnesium is pending at this time.  TSH is also pending.  Her blood counts are reassuring.  She does have a urinary tract infection and I have ordered Rocephin.  Culture has also been sent.  Plan admission at this time.  ____________________________________________  FINAL CLINICAL IMPRESSION(S) / ED DIAGNOSES  Final diagnoses:  Sinus bradycardia  Dizziness  Frequent PVCs         NEW MEDICATIONS STARTED DURING THIS VISIT:  New Prescriptions   No medications on file      Eula Listen, MD 06/16/17 2026

## 2017-06-16 NOTE — H&P (Signed)
[facility] [place] NAME: Desiree Koch    MR#:  [id number]  DATE OF BIRTH:  Apr 14, 1930  DATE OF ADMISSION:  06/16/2017  PRIMARY CARE PHYSICIAN: Adin Hector, MD   REQUESTING/REFERRING PHYSICIAN: Eula Listen, MD  CHIEF COMPLAINT:   Chief Complaint  Patient presents with  . Headache  . Dizziness    HISTORY OF PRESENT ILLNESS: Desiree Koch  is a 84 y.o. female with a known history of osteoarthritis, hypertension, hyperlipidemia, hypothyroidism, coronary artery disease who is presenting with complaint of headache and dizziness.  Patient states that she started feeling dizzy since this morning.  She also started having pressure-like sensation in her head.  She states she did not pass out or did not feel like she was going to pass out.  In the emergency room she is noted to have bradycardia as well as UTI.  She reports that she is had frequency with urination but no burning.  She also feels weak. PAST MEDICAL HISTORY:   Past Medical History:  Diagnosis Date  . Arthritis   . Cancer ([place]) 2018   skin ca  . Cardiac arrhythmia   . HOH (hard of hearing)   . Hypercholesteremia   . Hypertension   . Hypothyroidism   . Leaky heart valve   . Liver damage   . Multiple allergies   . Myocardial infarction ([place])   . Osteoporosis   . Pneumonia     PAST SURGICAL HISTORY:  Past Surgical History:  Procedure Laterality Date  . ABDOMINAL HYSTERECTOMY    . CATARACT EXTRACTION W/PHACO Left 09/12/2015   Procedure: CATARACT EXTRACTION PHACO AND INTRAOCULAR LENS PLACEMENT (IOC);  Surgeon: Eulogio Bear, MD;  Location: [facility] ORS;  Service: Ophthalmology;  Laterality: Left;  Lot# [id number] H [place]: 01:02.0 AP%:15.3 CDE: 9.45  . CATARACT EXTRACTION W/PHACO Right 10/31/2015   Procedure: CATARACT EXTRACTION PHACO AND INTRAOCULAR LENS PLACEMENT (IOC);  Surgeon: Eulogio Bear, MD;  Location: [facility] ORS;  Service: Ophthalmology;  Laterality: Right;  [place]  01:16.2 AP% 14.8 CDE 11.27 Fluid Pack Lot # [id number]  . CESAREAN SECTION    . CHOLECYSTECTOMY    . KNEE ARTHROSCOPY    . THYROIDECTOMY      SOCIAL HISTORY:  Social History   Tobacco Use  . Smoking status: Never Smoker  . Smokeless tobacco: Never Used  Substance Use Topics  . Alcohol use: No    FAMILY HISTORY:  Family History  Problem Relation Age of Onset  . Breast cancer Neg Hx     DRUG ALLERGIES:  Allergies  Allergen Reactions  . Amlodipine Swelling  . Clonidine Derivatives Swelling  . Norvasc [Amlodipine Besylate] Swelling  . Aspirin Other (See Comments)    Blood pressure goes up  . Celebrex [Celecoxib] Other (See Comments)    Blood pressure goes up  . Codeine Swelling  . Propoxyphene Swelling  . Latex Other (See Comments)    NEGATIVE RASH TEST 09/06/15  . Penicillins Other (See Comments)    Patient does not want medication  . Ace Inhibitors Cough  . Ciprofloxacin Palpitations  . Flexeril [Cyclobenzaprine] Other (See Comments)    Blood pressure goes up  . Hctz [Hydrochlorothiazide] Other (See Comments)    Unknown  . Lisinopril Other (See Comments)    Susceptible to sun  . Meperidine And Related Other (See Comments)    Unknown  . Pravastatin Rash  . Sulfa Antibiotics Rash    REVIEW OF SYSTEMS:   CONSTITUTIONAL:  No fever, positive fatigue or positive weakness.  EYES: No blurred or double vision.  EARS, NOSE, AND THROAT: No tinnitus or ear pain.  RESPIRATORY: No cough, shortness of breath, wheezing or hemoptysis.  CARDIOVASCULAR: No chest pain, orthopnea, edema.  GASTROINTESTINAL: No nausea, vomiting, diarrhea or abdominal pain.  GENITOURINARY: No dysuria, hematuria.  ENDOCRINE: No polyuria, nocturia,  HEMATOLOGY: No anemia, easy bruising or bleeding SKIN: No rash or lesion. MUSCULOSKELETAL: No joint pain or arthritis.   NEUROLOGIC: No tingling, numbness, weakness.  PSYCHIATRY: No anxiety or depression.   MEDICATIONS AT HOME:  Prior to Admission  medications   Medication Sig Start Date End Date Taking? Authorizing Provider  acetaminophen (TYLENOL) 325 MG tablet Take 325-650 mg by mouth every 6 (six) hours as needed.   Yes [provider]  carvedilol (COREG) 3.125 MG tablet Take 3.125 mg by mouth 2 (two) times daily with a meal.   Yes [provider]  Cholecalciferol (VITAMIN D) 2000 units tablet Take 2,000 Units by mouth daily.   Yes [provider]  fexofenadine (ALLEGRA) 180 MG tablet Take 180 mg by mouth daily.   Yes [provider]  fluticasone (FLONASE) 50 MCG/ACT nasal spray Place 2 sprays into both nostrils daily.   Yes [provider]  furosemide (LASIX) 20 MG tablet Take 20 mg by mouth daily.    Yes [provider]  Glucosamine 500 MG CAPS Take 500 mg by mouth daily.    Yes [provider]  levothyroxine (SYNTHROID, LEVOTHROID) 75 MCG tablet Take 75 mcg by mouth daily.   Yes [provider]  Multiple Vitamins-Minerals (PRESERVISION AREDS 2+MULTI VIT PO) Take 2 tablets by mouth daily.   Yes [provider]  telmisartan (MICARDIS) 80 MG tablet Take 80 mg by mouth daily.   Yes [provider]      PHYSICAL EXAMINATION:   VITAL SIGNS: Blood pressure (!) 167/68, pulse (!) 37, temperature 98.4 F (36.9 C), temperature source Oral, resp. rate 16, height 5\' 1"  (1.549 m), weight 152 lb (68.9 kg), SpO2 99 %.  GENERAL:  84 y.o.-year-old patient lying in the bed with no acute distress.  EYES: Pupils equal, round, reactive to light and accommodation. No scleral icterus. Extraocular muscles intact.  HEENT: Head atraumatic, normocephalic. Oropharynx and nasopharynx clear.  NECK:  Supple, no jugular venous distention. No thyroid enlargement, no tenderness.  LUNGS: Normal breath sounds bilaterally, no wheezing, rales,rhonchi or crepitation. No use of accessory muscles of respiration.  CARDIOVASCULAR: S1, S2 normal. No murmurs, rubs, or gallops.   ABDOMEN: Soft, nontender, nondistended. Bowel sounds present. No organomegaly or mass.  EXTREMITIES: No pedal edema, cyanosis, or clubbing.  NEUROLOGIC: Cranial nerves II through XII are intact. Muscle strength 5/5 in all extremities. Sensation intact. Gait not checked.  PSYCHIATRIC: The patient is alert and oriented x 3.  SKIN: No obvious rash, lesion, or ulcer.   LABORATORY PANEL:   CBC Recent Labs  Lab 06/16/17 1852  WBC 5.9  HGB 12.0  HCT 36.1  PLT 109*  MCV 108.0*  MCH 36.0*  MCHC 33.3  RDW 14.2   ------------------------------------------------------------------------------------------------------------------  Chemistries  Recent Labs  Lab 06/16/17 1852  NA 138  K 4.5  CL 105  CO2 26  GLUCOSE 102*  BUN 16  CREATININE 1.16*  CALCIUM 9.3  MG 1.9   ------------------------------------------------------------------------------------------------------------------ estimated creatinine clearance is 32 mL/min (A) (by C-G formula based on SCr of 1.16 mg/dL (H)). ------------------------------------------------------------------------------------------------------------------ Recent Labs    06/16/17 1852  TSH 1.186  Coagulation profile No results for input(s): INR, PROTIME in the last 168 hours. ------------------------------------------------------------------------------------------------------------------- No results for input(s): DDIMER in the last 72 hours. -------------------------------------------------------------------------------------------------------------------  Cardiac Enzymes Recent Labs  Lab 06/16/17 1852  TROPONINI <0.03   ------------------------------------------------------------------------------------------------------------------ Invalid input(s): POCBNP  ---------------------------------------------------------------------------------------------------------------  Urinalysis    Component Value Date/Time   COLORURINE YELLOW  (A) 06/16/2017 1930   APPEARANCEUR CLOUDY (A) 06/16/2017 1930   LABSPEC 1.004 (L) 06/16/2017 1930   PHURINE 5.0 06/16/2017 1930   GLUCOSEU NEGATIVE 06/16/2017 1930   HGBUR NEGATIVE 06/16/2017 1930   BILIRUBINUR NEGATIVE 06/16/2017 1930   KETONESUR NEGATIVE 06/16/2017 1930   PROTEINUR NEGATIVE 06/16/2017 1930   NITRITE NEGATIVE 06/16/2017 1930   LEUKOCYTESUR LARGE (A) 06/16/2017 1930     RADIOLOGY: Dg Chest 2 View  Result Date: 06/16/2017 CLINICAL DATA:  Headache and dizziness for 2-3 days. EXAM: CHEST - 2 VIEW COMPARISON:  07/18/2014 CXR, chest CT 07/24/2014 FINDINGS: Stable cardiomegaly with chronic mild subpleural interstitial prominence bilaterally. No overt pulmonary edema, effusion or pneumothorax. Soft tissue calcifications about the left humeral head compatible with calcific bursitis or rotator cuff tendinopathy. Remote left sided rib fractures. IMPRESSION: Stable cardiomegaly without active pulmonary disease. Mild subpleural chronic interstitial prominence is noted. Electronically Signed   By: Ashley Royalty M.D.   On: 06/16/2017 20:19   Ct Head Wo Contrast  Result Date: 06/16/2017 CLINICAL DATA:  High blood pressure, dizziness, history MI, hypertension EXAM: CT HEAD WITHOUT CONTRAST TECHNIQUE: Contiguous axial images were obtained from the base of the skull through the vertex without intravenous contrast. Sagittal and coronal MPR images reconstructed from axial data set. COMPARISON:  11/13/2004 FINDINGS: Brain: Generalized atrophy. Normal ventricular morphology. No midline shift or mass effect. Small vessel chronic ischemic changes of deep cerebral white matter. No intracranial hemorrhage, mass lesion, evidence of acute infarction, or extra-axial fluid collection. Vascular: Mild atherosclerotic calcifications of internal carotid arteries bilaterally at skull base Skull: Demineralized but intact Sinuses/Orbits: Clear Other: N/A IMPRESSION: Atrophy with small vessel chronic ischemic changes  of deep cerebral white matter. No acute intracranial abnormalities. Electronically Signed   By: Lavonia Dana M.D.   On: 06/16/2017 20:11    EKG: Orders placed or performed during the hospital encounter of 06/16/17  . EKG 12-Lead  . EKG 12-Lead  . ED EKG within 10 minutes  . ED EKG within 10 minutes    IMPRESSION AND PLAN: Patient is a 84 year old white female presenting to the hospital with complaining of feeling very weak and dizzy  1.  Bradycardia Monitor on telemetry Echo of the heart Cardiology consult Avoid AV nodal blocking agent Discontinue Coreg  2.  Urinary tract infection will treat with IV ceftriaxone follow urine cultures  3.  Hypothyroidism continue Synthroid  4.  Seasonal allergies continue Allegra  5.  Hypertension continue ARB  6.  Miscellaneous Lovenox for DVT prophylaxis    All the records are reviewed and case discussed with ED provider. Management plans discussed with the patient, family and they are in agreement.  CODE STATUS: Code Status History    This patient does not have a recorded code status. Please follow your organizational policy for patients in this situation.       TOTAL TIME TAKING CARE OF THIS PATIENT: 2minutes.    Dustin Flock M.D on 06/16/2017 at 9:13 PM  Between 7am to 6pm - Pager - [phone]  After 6pm go to [email or web address] - Proofreader  [employer] Office  [phone]  CC: Primary care physician; Ramonita Lab  Lanice Schwab, MD

## 2017-06-16 NOTE — ED Notes (Signed)
FN: pt brought over from [facility] for further eval of high blood pressure and dizziness.

## 2017-06-16 NOTE — Progress Notes (Signed)
PHARMACIST - PHYSICIAN ORDER COMMUNICATION  CONCERNING: P&T Medication Policy on Herbal Medications  DESCRIPTION:  This patient's order for:  Glucosamine  has been noted.  This product(s) is classified as an "herbal" or natural product. Due to a lack of definitive safety studies or FDA approval, nonstandard manufacturing practices, plus the potential risk of unknown drug-drug interactions while on inpatient medications, the Pharmacy and Therapeutics Committee does not permit the use of "herbal" or natural products of this type within [place].   ACTION TAKEN: The pharmacy department is unable to verify this order at this time. Please reevaluate patient's clinical condition at discharge and address if the herbal or natural product(s) should be resumed at that time.   

## 2017-06-17 ENCOUNTER — Inpatient Hospital Stay
Admit: 2017-06-17 | Discharge: 2017-06-17 | Disposition: A | Discharge status: Home / Self Care | Payer: Medicare HMO | Attending: Internal Medicine | Admitting: Internal Medicine

## 2017-06-17 LAB — BASIC METABOLIC PANEL
ANION GAP: 8 (ref 5–15)
BUN: 16 mg/dL (ref 6–20)
CO2: 22 mmol/L (ref 22–32)
Calcium: 8.4 mg/dL — ABNORMAL LOW (ref 8.9–10.3)
Chloride: 112 mmol/L — ABNORMAL HIGH (ref 101–111)
Creatinine, Ser: 1.19 mg/dL — ABNORMAL HIGH (ref 0.44–1.00)
GFR calc Af Amer: 47 mL/min — ABNORMAL LOW (ref >60)
GFR, EST NON AFRICAN AMERICAN: 41 mL/min — AB (ref >60)
GLUCOSE: 91 mg/dL (ref 65–99)
POTASSIUM: 4.2 mmol/L (ref 3.5–5.1)
Sodium: 142 mmol/L (ref 135–145)

## 2017-06-17 LAB — CBC
HEMATOCRIT: 32.3 % — AB (ref 35.0–47.0)
Hemoglobin: 10.7 g/dL — ABNORMAL LOW (ref 12.0–16.0)
MCH: 35.4 pg — ABNORMAL HIGH (ref 26.0–34.0)
MCHC: 33 g/dL (ref 32.0–36.0)
MCV: 107.3 fL — AB (ref 80.0–100.0)
PLATELETS: 100 10*3/uL — AB (ref 150–440)
RBC: 3.01 MIL/uL — AB (ref 3.80–5.20)
RDW: 14.1 % (ref 11.5–14.5)
WBC: 6.4 10*3/uL (ref 3.6–11.0)

## 2017-06-17 LAB — ECHOCARDIOGRAM COMPLETE
HEIGHTINCHES: 61 in
Weight: 2500.8 oz

## 2017-06-17 LAB — TSH: TSH: 1.855 u[IU]/mL (ref 0.350–4.500)

## 2017-06-17 NOTE — Progress Notes (Signed)
Desiree Koch, NT and [employer] nurse ambulated patient in the hallway. Patient tolerated walking, denies chest pain, she did complaints of SOB after ambulation but resolved when she rested. RN will continue to monitor.

## 2017-06-17 NOTE — Consult Note (Addendum)
Reason for Consult: Bradycardia vertigo Referring Physician: Dr. Burt Klein Dr. Sh Patel hospitalist Cardiologist Dr. Kowalski  Desiree Koch is an 84 y.o. female.  HPI: Patient 84-year-old female followed by Dr. Kowalski history of hypertension hyperlipidemia hypo-thyroidism known coronary disease osteoarthritis who complains of headaches and dizziness.  Patient had trouble with her vision as well as sinus trouble she is hard of hearing.  Patient started feeling pressure-like sensation in the head as well as vertigo did not fall felt like the room was spinning went to urgent care and subsequently there was referred to the emergency room.  EKG telemetry suggested bradycardia but patient appeared to be in bigeminy at a rate of 70 instead of a rate of 35.  Denies any chest pain no palpitations or tachycardia patient also complaining of urinary frequency urgency and burning she also states that she feels weak but denies any fever chills or sweats.  Past Medical History:  Diagnosis Date  . Arthritis   . Cancer (HCC) 2018   skin ca  . Cardiac arrhythmia   . HOH (hard of hearing)   . Hypercholesteremia   . Hypertension   . Hypothyroidism   . Leaky heart valve   . Liver damage   . Multiple allergies   . Myocardial infarction (HCC)   . Osteoporosis   . Pneumonia     Past Surgical History:  Procedure Laterality Date  . ABDOMINAL HYSTERECTOMY    . CATARACT EXTRACTION W/PHACO Left 09/12/2015   Procedure: CATARACT EXTRACTION PHACO AND INTRAOCULAR LENS PLACEMENT (IOC);  Surgeon: Bradley Mark King, MD;  Location: [facility] ORS;  Service: Ophthalmology;  Laterality: Left;  Lot# [id number] US: 01:02.0 AP%:15.3 CDE: 9.45  . CATARACT EXTRACTION W/PHACO Right 10/31/2015   Procedure: CATARACT EXTRACTION PHACO AND INTRAOCULAR LENS PLACEMENT (IOC);  Surgeon: Bradley Mark King, MD;  Location: [facility] ORS;  Service: Ophthalmology;  Laterality: Right;  US 01:16.2 AP% 14.8 CDE 11.27 Fluid Pack Lot # [id number]  .  CESAREAN SECTION    . CHOLECYSTECTOMY    . KNEE ARTHROSCOPY    . THYROIDECTOMY      Family History  Problem Relation Age of Onset  . Breast cancer Neg Hx     Social History:  reports that she has never smoked. She has never used smokeless tobacco. She reports that she does not drink alcohol or use drugs.  Allergies:  Allergies  Allergen Reactions  . Amlodipine Swelling  . Clonidine Derivatives Swelling  . Norvasc [Amlodipine Besylate] Swelling  . Aspirin Other (See Comments)    Blood pressure goes up  . Celebrex [Celecoxib] Other (See Comments)    Blood pressure goes up  . Codeine Swelling  . Propoxyphene Swelling  . Latex Other (See Comments)    NEGATIVE RASH TEST 09/06/15  . Penicillins Other (See Comments)    Patient does not want medication  . Ace Inhibitors Cough  . Ciprofloxacin Palpitations  . Flexeril [Cyclobenzaprine] Other (See Comments)    Blood pressure goes up  . Hctz [Hydrochlorothiazide] Other (See Comments)    Unknown  . Lisinopril Other (See Comments)    Susceptible to sun  . Meperidine And Related Other (See Comments)    Unknown  . Pravastatin Rash  . Sulfa Antibiotics Rash    Medications: I have reviewed the patient's current medications.  Results for orders placed or performed during the hospital encounter of 06/16/17 (from the past 48 hour(s))  Basic metabolic panel     Status: Abnormal   Collection Time: 06/16/17    6:52 PM  Result Value Ref Range   Sodium 138 135 - 145 mmol/L   Potassium 4.5 3.5 - 5.1 mmol/L   Chloride 105 101 - 111 mmol/L   CO2 26 22 - 32 mmol/L   Glucose, Bld 102 (H) 65 - 99 mg/dL   BUN 16 6 - 20 mg/dL   Creatinine, Ser 1.16 (H) 0.44 - 1.00 mg/dL   Calcium 9.3 8.9 - 10.3 mg/dL   GFR calc non Af Amer 42 (L) >60 mL/min   GFR calc Af Amer 49 (L) >60 mL/min    Comment: (NOTE) The eGFR has been calculated using the CKD EPI equation. This calculation has not been validated in all clinical situations. eGFR's persistently <60  mL/min signify possible Chronic Kidney Disease.    Anion gap 7 5 - 15    Comment: Performed at [facility], [address] [place], [place] [address]  CBC     Status: Abnormal   Collection Time: 06/16/17  6:52 PM  Result Value Ref Range   WBC 5.9 3.6 - 11.0 K/uL   RBC 3.35 (L) 3.80 - 5.20 MIL/uL   Hemoglobin 12.0 12.0 - 16.0 g/dL   HCT 36.1 35.0 - 47.0 %   MCV 108.0 (H) 80.0 - 100.0 fL   MCH 36.0 (H) 26.0 - 34.0 pg   MCHC 33.3 32.0 - 36.0 g/dL   RDW 14.2 11.5 - 14.5 %   Platelets 109 (L) 150 - 440 K/uL    Comment: Performed at [facility], [address] [place], [place] [address]  Troponin I     Status: None   Collection Time: 06/16/17  6:52 PM  Result Value Ref Range   Troponin I <0.03 <0.03 ng/mL    Comment: Performed at [facility], [address] [place], [place] [address]  Magnesium     Status: None   Collection Time: 06/16/17  6:52 PM  Result Value Ref Range   Magnesium 1.9 1.7 - 2.4 mg/dL    Comment: Performed at [facility], [address] [place], [place] [address]  TSH     Status: None   Collection Time: 06/16/17  6:52 PM  Result Value Ref Range   TSH 1.186 0.350 - 4.500 uIU/mL    Comment: Performed by a 3rd Generation assay with a functional sensitivity of <=0.01 uIU/mL. Performed at [facility], [address] [place], [place] [address]   Urinalysis, Complete w Microscopic     Status: Abnormal   Collection Time: 06/16/17  7:30 PM  Result Value Ref Range   Color, Urine YELLOW (A) YELLOW   APPearance CLOUDY (A) CLEAR   Specific Gravity, Urine 1.004 (L) 1.005 - 1.030   pH 5.0 5.0 - 8.0   Glucose, UA NEGATIVE NEGATIVE mg/dL   Hgb urine dipstick NEGATIVE NEGATIVE   Bilirubin Urine NEGATIVE NEGATIVE   Ketones, ur NEGATIVE NEGATIVE mg/dL   Protein, ur NEGATIVE NEGATIVE mg/dL   Nitrite NEGATIVE NEGATIVE   Leukocytes, UA LARGE (A) NEGATIVE   RBC / HPF 0-5 0 - 5 RBC/hpf   WBC, UA TOO NUMEROUS TO COUNT 0 -  5 WBC/hpf   Bacteria, UA MANY (A) NONE SEEN   Squamous Epithelial / LPF 0-5 (A) NONE SEEN    Comment: Performed at [facility], [address], [place], [place] [address]  TSH     Status: None   Collection Time: 06/17/17  5:37 AM  Result Value Ref Range   TSH 1.855  0.350 - 4.500 uIU/mL    Comment: Performed by a 3rd Generation assay with a functional sensitivity of <=0.01 uIU/mL. Performed at [facility], [address] [place], [place] [address]   CBC     Status: Abnormal   Collection Time: 06/17/17  5:37 AM  Result Value Ref Range   WBC 6.4 3.6 - 11.0 K/uL   RBC 3.01 (L) 3.80 - 5.20 MIL/uL   Hemoglobin 10.7 (L) 12.0 - 16.0 g/dL   HCT 32.3 (L) 35.0 - 47.0 %   MCV 107.3 (H) 80.0 - 100.0 fL   MCH 35.4 (H) 26.0 - 34.0 pg   MCHC 33.0 32.0 - 36.0 g/dL   RDW 14.1 11.5 - 14.5 %   Platelets 100 (L) 150 - 440 K/uL    Comment: Performed at [facility], [address] [place], [place] [address]  Basic metabolic panel     Status: Abnormal   Collection Time: 06/17/17  5:37 AM  Result Value Ref Range   Sodium 142 135 - 145 mmol/L   Potassium 4.2 3.5 - 5.1 mmol/L   Chloride 112 (H) 101 - 111 mmol/L   CO2 22 22 - 32 mmol/L   Glucose, Bld 91 65 - 99 mg/dL   BUN 16 6 - 20 mg/dL   Creatinine, Ser 1.19 (H) 0.44 - 1.00 mg/dL   Calcium 8.4 (L) 8.9 - 10.3 mg/dL   GFR calc non Af Amer 41 (L) >60 mL/min   GFR calc Af Amer 47 (L) >60 mL/min    Comment: (NOTE) The eGFR has been calculated using the CKD EPI equation. This calculation has not been validated in all clinical situations. eGFR's persistently <60 mL/min signify possible Chronic Kidney Disease.    Anion gap 8 5 - 15    Comment: Performed at [facility], [address] , [place] [address]    Dg Chest 2 View  Result Date: 06/16/2017 CLINICAL DATA:  Headache and dizziness for 2-3 days. EXAM: CHEST - 2 VIEW COMPARISON:  07/18/2014 CXR, chest CT 07/24/2014 FINDINGS: Stable cardiomegaly  with chronic mild subpleural interstitial prominence bilaterally. No overt pulmonary edema, effusion or pneumothorax. Soft tissue calcifications about the left humeral head compatible with calcific bursitis or rotator cuff tendinopathy. Remote left sided rib fractures. IMPRESSION: Stable cardiomegaly without active pulmonary disease. Mild subpleural chronic interstitial prominence is noted. Electronically Signed   By: David  Kwon M.D.   On: 06/16/2017 20:19   Ct Head Wo Contrast  Result Date: 06/16/2017 CLINICAL DATA:  High blood pressure, dizziness, history MI, hypertension EXAM: CT HEAD WITHOUT CONTRAST TECHNIQUE: Contiguous axial images were obtained from the base of the skull through the vertex without intravenous contrast. Sagittal and coronal MPR images reconstructed from axial data set. COMPARISON:  11/13/2004 FINDINGS: Brain: Generalized atrophy. Normal ventricular morphology. No midline shift or mass effect. Small vessel chronic ischemic changes of deep cerebral white matter. No intracranial hemorrhage, mass lesion, evidence of acute infarction, or extra-axial fluid collection. Vascular: Mild atherosclerotic calcifications of internal carotid arteries bilaterally at skull base Skull: Demineralized but intact Sinuses/Orbits: Clear Other: N/A IMPRESSION: Atrophy with small vessel chronic ischemic changes of deep cerebral white matter. No acute intracranial abnormalities. Electronically Signed   By: Mark  Boles M.D.   On: 06/16/2017 20:11    Review of Systems  Constitutional: Positive for diaphoresis and malaise/fatigue.  HENT: Positive for congestion and hearing loss.   Eyes: Negative.   Respiratory: Positive for cough and shortness of breath.     Cardiovascular: Negative.   Gastrointestinal: Negative.   Genitourinary: Positive for frequency and urgency.  Musculoskeletal: Negative.   Skin: Negative.   Neurological: Positive for dizziness, weakness and headaches.  Endo/Heme/Allergies: Negative.    Psychiatric/Behavioral: Negative.    Blood pressure (!) 149/42, pulse 76, temperature 98.4 F (36.9 C), temperature source Oral, resp. rate 16, height 5' 1" (1.549 m), weight 156 lb 4.8 oz (70.9 kg), SpO2 93 %. Physical Exam  Nursing note and vitals reviewed. Constitutional: She is oriented to person, place, and time. She appears well-developed and well-nourished.  HENT:  Head: Normocephalic and atraumatic.  Eyes: Pupils are equal, round, and reactive to light. Conjunctivae and EOM are normal.  Neck: Normal range of motion. Neck supple.  Cardiovascular: Normal rate. An irregularly irregular rhythm present. Frequent extrasystoles are present.  Murmur heard. Respiratory: Effort normal and breath sounds normal.  GI: Soft. Bowel sounds are normal.  Musculoskeletal: Normal range of motion.  Neurological: She is alert and oriented to person, place, and time. She has normal reflexes.  Skin: Skin is warm and dry.  Psychiatric: She has a normal mood and affect.    Assessment/Plan: Irregular heartbeat Bradycardia Hypertension Hypothyroidism Hyperlipidemia Reduced hearing Headache Urinary tract infection Coronary artery disease Osteoarthritis Chronic sinusitis . PLAN Agree with admission to telemetry All up EKGs and troponins Continue Coreg therapy for extrasystoles No clear indication for permanent pacemaker No obvious evidence of bradycardia just bigeminy Hypertension continue telmisartan Coreg Continue thyroid management with levothyroxine Echocardiogram for further assessment evaluation Increased activity as necessary Agree with antibiotic therapy for possible urinary tract infection    Shayann Garbutt D Irfan Veal 06/17/2017, 9:50 AM

## 2017-06-17 NOTE — Progress Notes (Signed)
[facility] [place] NAME: Desiree Koch    MR#:  [id number]  DATE OF BIRTH:  [birth year]  SUBJECTIVE:  CHIEF COMPLAINT:   Chief Complaint  Patient presents with  . Headache  . Dizziness  Patient without complaint, patient's daughter at the bedside, follow-up on echocardiogram/urine cultures, cardiology input appreciated, increase activity, ambulate in halls, physical therapy to see  REVIEW OF SYSTEMS:  CONSTITUTIONAL: No fever, fatigue or weakness.  EYES: No blurred or double vision.  EARS, NOSE, AND THROAT: No tinnitus or ear pain.  RESPIRATORY: No cough, shortness of breath, wheezing or hemoptysis.  CARDIOVASCULAR: No chest pain, orthopnea, edema.  GASTROINTESTINAL: No nausea, vomiting, diarrhea or abdominal pain.  GENITOURINARY: No dysuria, hematuria.  ENDOCRINE: No polyuria, nocturia,  HEMATOLOGY: No anemia, easy bruising or bleeding SKIN: No rash or lesion. MUSCULOSKELETAL: No joint pain or arthritis.   NEUROLOGIC: No tingling, numbness, weakness.  PSYCHIATRY: No anxiety or depression.   ROS  DRUG ALLERGIES:   Allergies  Allergen Reactions  . Amlodipine Swelling  . Clonidine Derivatives Swelling  . Norvasc [Amlodipine Besylate] Swelling  . Aspirin Other (See Comments)    Blood pressure goes up  . Celebrex [Celecoxib] Other (See Comments)    Blood pressure goes up  . Codeine Swelling  . Propoxyphene Swelling  . Latex Other (See Comments)    NEGATIVE RASH TEST 09/06/15  . Penicillins Other (See Comments)    Patient does not want medication  . Ace Inhibitors Cough  . Ciprofloxacin Palpitations  . Flexeril [Cyclobenzaprine] Other (See Comments)    Blood pressure goes up  . Hctz [Hydrochlorothiazide] Other (See Comments)    Unknown  . Lisinopril Other (See Comments)    Susceptible to sun  . Meperidine And Related Other (See Comments)    Unknown  . Pravastatin Rash  . Sulfa Antibiotics Rash    VITALS:  Blood  pressure (!) 149/42, pulse 76, temperature 98.4 F (36.9 C), temperature source Oral, resp. rate 16, height 5\' 1"  (1.549 m), weight 70.9 kg (156 lb 4.8 oz), SpO2 93 %.  PHYSICAL EXAMINATION:  GENERAL:  84 y.o.-year-old patient lying in the bed with no acute distress.  EYES: Pupils equal, round, reactive to light and accommodation. No scleral icterus. Extraocular muscles intact.  HEENT: Head atraumatic, normocephalic. Oropharynx and nasopharynx clear.  NECK:  Supple, no jugular venous distention. No thyroid enlargement, no tenderness.  LUNGS: Normal breath sounds bilaterally, no wheezing, rales,rhonchi or crepitation. No use of accessory muscles of respiration.  CARDIOVASCULAR: S1, S2 normal. No murmurs, rubs, or gallops.  ABDOMEN: Soft, nontender, nondistended. Bowel sounds present. No organomegaly or mass.  EXTREMITIES: No pedal edema, cyanosis, or clubbing.  NEUROLOGIC: Cranial nerves II through XII are intact. Muscle strength 5/5 in all extremities. Sensation intact. Gait not checked.  PSYCHIATRIC: The patient is alert and oriented x 3.  SKIN: No obvious rash, lesion, or ulcer.   Physical Exam LABORATORY PANEL:   CBC Recent Labs  Lab 06/17/17 0537  WBC 6.4  HGB 10.7*  HCT 32.3*  PLT 100*   ------------------------------------------------------------------------------------------------------------------  Chemistries  Recent Labs  Lab 06/16/17 1852 06/17/17 0537  NA 138 142  K 4.5 4.2  CL 105 112*  CO2 26 22  GLUCOSE 102* 91  BUN 16 16  CREATININE 1.16* 1.19*  CALCIUM 9.3 8.4*  MG 1.9  --    ------------------------------------------------------------------------------------------------------------------  Cardiac Enzymes Recent Labs  Lab 06/16/17 1852  TROPONINI <0.03   ------------------------------------------------------------------------------------------------------------------  RADIOLOGY:  Dg Chest 2 View  Result Date: 06/16/2017 CLINICAL DATA:   Headache and dizziness for 2-3 days. EXAM: CHEST - 2 VIEW COMPARISON:  07/18/2014 CXR, chest CT 07/24/2014 FINDINGS: Stable cardiomegaly with chronic mild subpleural interstitial prominence bilaterally. No overt pulmonary edema, effusion or pneumothorax. Soft tissue calcifications about the left humeral head compatible with calcific bursitis or rotator cuff tendinopathy. Remote left sided rib fractures. IMPRESSION: Stable cardiomegaly without active pulmonary disease. Mild subpleural chronic interstitial prominence is noted. Electronically Signed   By: Ashley Royalty M.D.   On: 06/16/2017 20:19   Ct Head Wo Contrast  Result Date: 06/16/2017 CLINICAL DATA:  High blood pressure, dizziness, history MI, hypertension EXAM: CT HEAD WITHOUT CONTRAST TECHNIQUE: Contiguous axial images were obtained from the base of the skull through the vertex without intravenous contrast. Sagittal and coronal MPR images reconstructed from axial data set. COMPARISON:  11/13/2004 FINDINGS: Brain: Generalized atrophy. Normal ventricular morphology. No midline shift or mass effect. Small vessel chronic ischemic changes of deep cerebral white matter. No intracranial hemorrhage, mass lesion, evidence of acute infarction, or extra-axial fluid collection. Vascular: Mild atherosclerotic calcifications of internal carotid arteries bilaterally at skull base Skull: Demineralized but intact Sinuses/Orbits: Clear Other: N/A IMPRESSION: Atrophy with small vessel chronic ischemic changes of deep cerebral white matter. No acute intracranial abnormalities. Electronically Signed   By: Lavonia Dana M.D.   On: 06/16/2017 20:11    ASSESSMENT AND PLAN:  Patient is a 84 year old white female presenting to the hospital with complaining of feeling very weak and dizzy  1.    Acute bradycardia Improving Continue to avoid AV nodal blocking agents, Coreg discontinued, follow-up on echocardiogram, cardiology input greatly appreciated, increase activity/ambulate  with assistance in halls, physical therapy to evaluate/treat, continue telemetry, and continue close medical monitoring  2.   Acute Urinary tract infection Stable Continue empiric IV ceftriaxone and follow urine cultures  3.    Chronic hypothyroidism, unspecified Stable Continue Synthroid TSH normal  4.    Chronic seasonal allergies Stable on Allegra  5.    Chronic benign essential hypertension  Stable   Continue ARB   Lovenox for DVT prophylaxis  All the records are reviewed and case discussed with Care Management/Social Workerr. Management plans discussed with the patient, family and they are in agreement.  CODE STATUS: full  TOTAL TIME TAKING CARE OF THIS PATIENT: 35 minutes.     POSSIBLE D/C IN 1-2 DAYS, DEPENDING ON CLINICAL CONDITION.   Avel Peace Elleen Coulibaly M.D on 06/17/2017   Between 7am to 6pm - Pager - [phone]  After 6pm go to [email or web address] - password [email or web address] [facility]  Office  [phone]  CC: Primary care physician; Adin Hector, MD  Note: This dictation was prepared with Dragon dictation along with smaller phrase technology. Any transcriptional errors that result from this process are unintentional.

## 2017-06-17 NOTE — Progress Notes (Signed)
*  PRELIMINARY RESULTS* Echocardiogram 2D Echocardiogram has been performed.  Desiree Koch 06/17/2017, 10:08 AM

## 2017-06-17 NOTE — Progress Notes (Signed)
Talked to Dr. Clayborn Bigness and clarify if we are restarting patient's coreg, per MD ok to restart, he is just reviewing patient's echo. Also asked if he can see the patient's family per their request. RN will continue to monitor.

## 2017-06-18 MED ORDER — CEFDINIR 300 MG PO CAPS: 300.0000 mg | ORAL_CAPSULE | Freq: Two times a day (BID) | ORAL | 0 refills | Status: DC

## 2017-06-18 NOTE — Progress Notes (Signed)
Pt d/c to home today.  IV removed intact.  D/c paperwork printed and reviewed w/pt.  All medication questions and concerns reviewed and pt states understanding.  All Rx's given to patient. Pt requested to wheelchaired out for discharge.

## 2017-06-18 NOTE — Discharge Summary (Signed)
[facility] NAME: Desiree Koch    MR#:  [id number]  DATE OF BIRTH:  June 25, 1930  DATE OF ADMISSION:  06/16/2017 ADMITTING PHYSICIAN: Dustin Flock, MD  DATE OF DISCHARGE: No discharge date for patient encounter.  PRIMARY CARE PHYSICIAN: Tama High III, MD    ADMISSION DIAGNOSIS:  Dizziness [R42] Sinus bradycardia [R00.1] Frequent PVCs [I49.3]  DISCHARGE DIAGNOSIS:  Active Problems:   Bradycardia   SECONDARY DIAGNOSIS:   Past Medical History:  Diagnosis Date  . Arthritis   . Cancer ([place]) 2018   skin ca  . Cardiac arrhythmia   . HOH (hard of hearing)   . Hypercholesteremia   . Hypertension   . Hypothyroidism   . Leaky heart valve   . Liver damage   . Multiple allergies   . Myocardial infarction ([place])   . Osteoporosis   . Pneumonia     HOSPITAL COURSE:  Patient is a 84 year old white female presenting to the hospital with complaining of feeling very weak and dizzy  1.  Acute bradycardia Resolved Admitted to regular nursing for bed, Coreg held while in house, seen by Dr. call would/cardiology-recommended patient be discharged home on home regiment which includes Coreg, patient follow-up with cardiology status post discharge in 1 week for reevaluation, echocardiogram done while in house-report still pending at the time of discharge-we will follow with cardiology as stated above for results  2.  AcuteUrinary tract infection Resolving Treated with empiric Rocephin while in house, cultures pending at the time of discharge, patient to be discharged on Ceftin ear for another 2 days, to follow-up with primary care provider in 3-5 days for reevaluation  3.  Chronic hypothyroidism, unspecified Stable Continued Synthroid TSH normal  4.  Chronic seasonal allergies Stable on Allegra  5.  Chronic benign essential hypertension  Stable   Continued ARB   DISCHARGE CONDITIONS:  On the day of  discharge patient is afebrile, hemodynamically stable, tolerating diet, ready for discharge home in care of family with appropriate follow-up with primary care provider and cardiology, for more specific details please see chart   CONSULTS OBTAINED:  Treatment Team:  Yolonda Kida, MD  DRUG ALLERGIES:   Allergies  Allergen Reactions  . Amlodipine Swelling  . Clonidine Derivatives Swelling  . Norvasc [Amlodipine Besylate] Swelling  . Aspirin Other (See Comments)    Blood pressure goes up  . Celebrex [Celecoxib] Other (See Comments)    Blood pressure goes up  . Codeine Swelling  . Propoxyphene Swelling  . Latex Other (See Comments)    NEGATIVE RASH TEST 09/06/15  . Penicillins Other (See Comments)    Patient does not want medication  . Ace Inhibitors Cough  . Ciprofloxacin Palpitations  . Flexeril [Cyclobenzaprine] Other (See Comments)    Blood pressure goes up  . Hctz [Hydrochlorothiazide] Other (See Comments)    Unknown  . Lisinopril Other (See Comments)    Susceptible to sun  . Meperidine And Related Other (See Comments)    Unknown  . Pravastatin Rash  . Sulfa Antibiotics Rash    DISCHARGE MEDICATIONS:   Allergies as of 06/18/2017      Reactions   Amlodipine Swelling   Clonidine Derivatives Swelling   Norvasc [amlodipine Besylate] Swelling   Aspirin Other (See Comments)   Blood pressure goes up   Celebrex [celecoxib] Other (See Comments)   Blood pressure goes up   Codeine Swelling   Propoxyphene Swelling   Latex Other (  See Comments)   NEGATIVE RASH TEST 09/06/15   Penicillins Other (See Comments)   Patient does not want medication   Ace Inhibitors Cough   Ciprofloxacin Palpitations   Flexeril [cyclobenzaprine] Other (See Comments)   Blood pressure goes up   Hctz [hydrochlorothiazide] Other (See Comments)   Unknown   Lisinopril Other (See Comments)   Susceptible to sun   Meperidine And Related Other (See Comments)   Unknown   Pravastatin Rash   Sulfa  Antibiotics Rash      Medication List    TAKE these medications   acetaminophen 325 MG tablet Commonly known as:  TYLENOL Take 325-650 mg by mouth every 6 (six) hours as needed.   carvedilol 3.125 MG tablet Commonly known as:  COREG Take 3.125 mg by mouth 2 (two) times daily with a meal.   cefdinir 300 MG capsule Commonly known as:  OMNICEF Take 1 capsule (300 mg total) by mouth 2 (two) times daily.   fexofenadine 180 MG tablet Commonly known as:  ALLEGRA Take 180 mg by mouth daily.   fluticasone 50 MCG/ACT nasal spray Commonly known as:  FLONASE Place 2 sprays into both nostrils daily.   furosemide 20 MG tablet Commonly known as:  LASIX Take 20 mg by mouth daily.   Glucosamine 500 MG Caps Take 500 mg by mouth daily.   levothyroxine 75 MCG tablet Commonly known as:  SYNTHROID, LEVOTHROID Take 75 mcg by mouth daily.   PRESERVISION AREDS 2+MULTI VIT PO Take 2 tablets by mouth daily.   telmisartan 80 MG tablet Commonly known as:  MICARDIS Take 80 mg by mouth daily.   Vitamin D 2000 units tablet Take 2,000 Units by mouth daily.        DISCHARGE INSTRUCTIONS:  If you experience worsening of your admission symptoms, develop shortness of breath, life threatening emergency, suicidal or homicidal thoughts you must seek medical attention immediately by calling 911 or calling your MD immediately  if symptoms less severe.  You Must read complete instructions/literature along with all the possible adverse reactions/side effects for all the Medicines you take and that have been prescribed to you. Take any new Medicines after you have completely understood and accept all the possible adverse reactions/side effects.   Please note  You were cared for by a hospitalist during your hospital stay. If you have any questions about your discharge medications or the care you received while you were in the hospital after you are discharged, you can call the unit and asked to speak  with the hospitalist on call if the hospitalist that took care of you is not available. Once you are discharged, your primary care physician will handle any further medical issues. Please note that NO REFILLS for any discharge medications will be authorized once you are discharged, as it is imperative that you return to your primary care physician (or establish a relationship with a primary care physician if you do not have one) for your aftercare needs so that they can reassess your need for medications and monitor your lab values.    Today   CHIEF COMPLAINT:   Chief Complaint  Patient presents with  . Headache  . Dizziness    HISTORY OF PRESENT ILLNESS:  84 y.o. female with a known history of osteoarthritis, hypertension, hyperlipidemia, hypothyroidism, coronary artery disease who is presenting with complaint of headache and dizziness.  Patient states that she started feeling dizzy since this morning.  She also started having pressure-like sensation in her head.  She states she did not pass out or did not feel like she was going to pass out.  In the emergency room she is noted to have bradycardia as well as UTI.  She reports that she is had frequency with urination but no burning.  She also feels weak.  VITAL SIGNS:  Blood pressure (!) 165/48, pulse 78, temperature 98.1 F (36.7 C), temperature source Oral, resp. rate 18, height 5\' 1"  (1.549 m), weight 70.9 kg (156 lb 4.8 oz), SpO2 99 %.  I/O:    Intake/Output Summary (Last 24 hours) at 06/18/2017 1150 Last data filed at 06/18/2017 1125 Gross per 24 hour  Intake 1786.17 ml  Output 900 ml  Net 886.17 ml    PHYSICAL EXAMINATION:  GENERAL:  84 y.o.-year-old patient lying in the bed with no acute distress.  EYES: Pupils equal, round, reactive to light and accommodation. No scleral icterus. Extraocular muscles intact.  HEENT: Head atraumatic, normocephalic. Oropharynx and nasopharynx clear.  NECK:  Supple, no jugular venous distention.  No thyroid enlargement, no tenderness.  LUNGS: Normal breath sounds bilaterally, no wheezing, rales,rhonchi or crepitation. No use of accessory muscles of respiration.  CARDIOVASCULAR: S1, S2 normal. No murmurs, rubs, or gallops.  ABDOMEN: Soft, non-tender, non-distended. Bowel sounds present. No organomegaly or mass.  EXTREMITIES: No pedal edema, cyanosis, or clubbing.  NEUROLOGIC: Cranial nerves II through XII are intact. Muscle strength 5/5 in all extremities. Sensation intact. Gait not checked.  PSYCHIATRIC: The patient is alert and oriented x 3.  SKIN: No obvious rash, lesion, or ulcer.   DATA REVIEW:   CBC Recent Labs  Lab 06/17/17 0537  WBC 6.4  HGB 10.7*  HCT 32.3*  PLT 100*    Chemistries  Recent Labs  Lab 06/16/17 1852 06/17/17 0537  NA 138 142  K 4.5 4.2  CL 105 112*  CO2 26 22  GLUCOSE 102* 91  BUN 16 16  CREATININE 1.16* 1.19*  CALCIUM 9.3 8.4*  MG 1.9  --     Cardiac Enzymes Recent Labs  Lab 06/16/17 1852  TROPONINI <0.03    Microbiology Results  Results for orders placed or performed during the hospital encounter of 06/16/17  Urine Culture     Status: Abnormal (Preliminary result)   Collection Time: 06/16/17  7:30 PM  Result Value Ref Range Status   Specimen Description   Final    URINE, RANDOM Performed at [facility], [address] [place], [place] [address]    Special Requests   Final    NONE Performed at [facility], [address], [place], [place] [address]    Culture (A)  Final    >=100,000 COLONIES/mL ESCHERICHIA COLI SUSCEPTIBILITIES TO FOLLOW Performed at [facility], [place] [address], [place], [place] [address]    Report Status PENDING  Incomplete    RADIOLOGY:  Dg Chest 2 View  Result Date: 06/16/2017 CLINICAL DATA:  Headache and dizziness for 2-3 days. EXAM: CHEST - 2 VIEW COMPARISON:  07/18/2014 CXR, chest CT 07/24/2014 FINDINGS: Stable cardiomegaly with chronic mild subpleural interstitial  prominence bilaterally. No overt pulmonary edema, effusion or pneumothorax. Soft tissue calcifications about the left humeral head compatible with calcific bursitis or rotator cuff tendinopathy. Remote left sided rib fractures. IMPRESSION: Stable cardiomegaly without active pulmonary disease. Mild subpleural chronic interstitial prominence is noted. Electronically Signed   By: Ashley Royalty M.D.   On: 06/16/2017 20:19   Ct Head Wo Contrast  Result Date: 06/16/2017 CLINICAL DATA:  High  blood pressure, dizziness, history MI, hypertension EXAM: CT HEAD WITHOUT CONTRAST TECHNIQUE: Contiguous axial images were obtained from the base of the skull through the vertex without intravenous contrast. Sagittal and coronal MPR images reconstructed from axial data set. COMPARISON:  11/13/2004 FINDINGS: Brain: Generalized atrophy. Normal ventricular morphology. No midline shift or mass effect. Small vessel chronic ischemic changes of deep cerebral white matter. No intracranial hemorrhage, mass lesion, evidence of acute infarction, or extra-axial fluid collection. Vascular: Mild atherosclerotic calcifications of internal carotid arteries bilaterally at skull base Skull: Demineralized but intact Sinuses/Orbits: Clear Other: N/A IMPRESSION: Atrophy with small vessel chronic ischemic changes of deep cerebral white matter. No acute intracranial abnormalities. Electronically Signed   By: Lavonia Dana M.D.   On: 06/16/2017 20:11    EKG:   Orders placed or performed during the hospital encounter of 06/16/17  . EKG 12-Lead  . EKG 12-Lead  . ED EKG within 10 minutes  . ED EKG within 10 minutes  . EKG 12-Lead  . EKG 12-Lead      Management plans discussed with the patient, family and they are in agreement.  CODE STATUS:     Code Status Orders  (From admission, onward)        Start     Ordered   06/16/17 2203  Full code  Continuous     06/16/17 2203    Code Status History    This patient has a current code  status but no historical code status.      TOTAL TIME TAKING CARE OF THIS PATIENT: 35 minutes.    Avel Peace Salary M.D on 06/18/2017 at 11:50 AM  Between 7am to 6pm - Pager - [phone]  After 6pm go to [email or web address] - password [email or web address] [facility]  Office  [phone]  CC: Primary care physician; Adin Hector, MD   Note: This dictation was prepared with Dragon dictation along with smaller phrase technology. Any transcriptional errors that result from this process are unintentional.

## 2017-06-18 NOTE — Plan of Care (Signed)
  Problem: Clinical Measurements: Goal: Ability to maintain clinical measurements within normal limits will improve Outcome: Progressing Goal: Cardiovascular complication will be avoided Outcome: Progressing   Problem: Pain Managment: Goal: General experience of comfort will improve Outcome: Progressing   Problem: Safety: Goal: Ability to remain free from injury will improve Outcome: Progressing

## 2017-06-18 NOTE — Evaluation (Signed)
Physical Therapy Evaluation Patient Details Name: Desiree Koch MRN: [id number] DOB: May 04, 1930 Today's Date: 06/18/2017   History of Present Illness  presented to ER secondary to headache,dizziness; admitted for management of bradycardia, UTI.  Clinical Impression  Upon evaluation, patient alert and oriented; follows all commands and demonstrates good insight/safety awareness.  Bilat UE/LE strength and ROM grossly symmetrical and WFL; no focal weakness appreciated.  Able to complete bed mobility indep; sit/stand, basic transfers and gait (400') without assist device, sup/mod indep.  Fair/good cadence and gait speed with appropriate ability to self-correct any intermittent LOB.  HR stable in 80-100s with activity; no reports of chest pain, SOB or dizziness. Patient feels at baseline for functional ability; no acute PT needs identified.  Will complete order at this time; please re-consult should needs change.    Follow Up Recommendations No PT follow up    Equipment Recommendations       Recommendations for Other Services       Precautions / Restrictions Precautions Precautions: Fall Restrictions Weight Bearing Restrictions: No      Mobility  Bed Mobility Overal bed mobility: Independent                Transfers Overall transfer level: Modified independent Equipment used: None             General transfer comment: sit/stand without assist device, no difficulty  Ambulation/Gait Ambulation/Gait assistance: Supervision Ambulation Distance (Feet): 400 Feet Assistive device: None   Gait velocity: 10' walk time, 6-7 seconds   General Gait Details: reciprocal stepping pattern with good step height/length, good trunk rotation and arm swing; cadence/gait speed improving throughout distance.  Intermittent sway/deviation with lateral head turns, but self-corrects (LE step strategy) without difficulty  Stairs            Wheelchair Mobility    Modified Rankin  (Stroke Patients Only)       Balance Overall balance assessment: Needs assistance Sitting-balance support: No upper extremity supported;Feet supported Sitting balance-Leahy Scale: Normal     Standing balance support: No upper extremity supported Standing balance-Leahy Scale: Good Standing balance comment: standing functional reach >6"; returns to upright without difficulty Single Leg Stance - Right Leg: 5 Single Leg Stance - Left Leg: 8                         Pertinent Vitals/Pain Pain Assessment: No/denies pain    Home Living Family/patient expects to be discharged to:: Private residence Living Arrangements: Alone Available Help at Discharge: Family;Available PRN/intermittently Type of Home: House Home Access: Stairs to enter Entrance Stairs-Rails: Right Entrance Stairs-Number of Steps: 2 Home Layout: One level        Prior Function Level of Independence: Independent         Comments: Indep with ADLs, household and commumity mobility; does have SPC that she uses intermittently "when needed".  Denies fall history within previous six months.     Hand Dominance        Extremity/Trunk Assessment   Upper Extremity Assessment Upper Extremity Assessment: Overall WFL for tasks assessed    Lower Extremity Assessment Lower Extremity Assessment: Overall WFL for tasks assessed(grossly at least 4/5 throughout)       Communication   Communication: No difficulties  Cognition Arousal/Alertness: Awake/alert Behavior During Therapy: WFL for tasks assessed/performed Overall Cognitive Status: Within Functional Limits for tasks assessed  General Comments      Exercises     Assessment/Plan    PT Assessment Patent does not need any further PT services  PT Problem List         PT Treatment Interventions      PT Goals (Current goals can be found in the Care Plan section)  Acute Rehab PT  Goals Patient Stated Goal: to go home PT Goal Formulation: All assessment and education complete, DC therapy Time For Goal Achievement: 06/18/17 Potential to Achieve Goals: Good    Frequency     Barriers to discharge        Co-evaluation               AM-PAC PT "6 Clicks" Daily Activity  Outcome Measure Difficulty turning over in bed (including adjusting bedclothes, sheets and blankets)?: None Difficulty moving from lying on back to sitting on the side of the bed? : None Difficulty sitting down on and standing up from a chair with arms (e.g., wheelchair, bedside commode, etc,.)?: None Help needed moving to and from a bed to chair (including a wheelchair)?: None Help needed walking in hospital room?: A Little Help needed climbing 3-5 steps with a railing? : A Little 6 Click Score: 22    End of Session Equipment Utilized During Treatment: Gait belt Activity Tolerance: Patient tolerated treatment well Patient left: in bed;with call bell/phone within reach;with bed alarm set Nurse Communication: Mobility status PT Visit Diagnosis: Difficulty in walking, not elsewhere classified (R26.2)    Time: 4097-3532 PT Time Calculation (min) (ACUTE ONLY): 11 min   Charges:   PT Evaluation $PT Eval Low Complexity: 1 Low     PT G Codes:        Elaina Cara H. Owens Shark, PT, DPT, NCS 06/18/17, 11:11 AM [phone]

## 2017-06-19 LAB — URINE CULTURE: Culture: >=100000 — AB

## 2017-06-21 ENCOUNTER — Telehealth: Payer: Self-pay

## 2017-06-21 NOTE — Telephone Encounter (Signed)
EMMI Follow-up: Patient called me as she received a call from my phone number.  This patient is on the Future EMMI call list and said she has appointments scheduled and got her antibotics filled and was taking them. She said everyone was very nice to her. No needs identified.

## 2017-08-30 ENCOUNTER — Ambulatory Visit
Admission: RE | Admit: 2017-08-30 | Discharge: 2017-08-30 | Disposition: A | Discharge status: Home / Self Care | Payer: Medicare HMO | Source: Ambulatory Visit | Attending: Internal Medicine | Admitting: Internal Medicine

## 2017-08-30 DIAGNOSIS — K7460 Unspecified cirrhosis of liver: Secondary | ICD-10-CM

## 2017-12-16 ENCOUNTER — Other Ambulatory Visit: Payer: Self-pay | Admitting: Internal Medicine

## 2017-12-16 DIAGNOSIS — K7460 Unspecified cirrhosis of liver: Secondary | ICD-10-CM

## 2017-12-24 ENCOUNTER — Ambulatory Visit
Admission: RE | Admit: 2017-12-24 | Discharge: 2017-12-24 | Disposition: A | Discharge status: Home / Self Care | Payer: Medicare HMO | Source: Ambulatory Visit | Attending: Internal Medicine | Admitting: Internal Medicine

## 2017-12-24 DIAGNOSIS — Z9049 Acquired absence of other specified parts of digestive tract: Secondary | ICD-10-CM | POA: Insufficient documentation

## 2017-12-24 DIAGNOSIS — K7460 Unspecified cirrhosis of liver: Secondary | ICD-10-CM | POA: Insufficient documentation

## 2018-07-28 ENCOUNTER — Other Ambulatory Visit: Payer: Self-pay | Admitting: Internal Medicine

## 2018-07-28 DIAGNOSIS — K7460 Unspecified cirrhosis of liver: Secondary | ICD-10-CM

## 2018-07-28 DIAGNOSIS — K766 Portal hypertension: Secondary | ICD-10-CM

## 2018-07-29 ENCOUNTER — Ambulatory Visit
Admission: RE | Admit: 2018-07-29 | Discharge: 2018-07-29 | Disposition: A | Discharge status: Home / Self Care | Payer: Medicare HMO | Source: Ambulatory Visit | Attending: Internal Medicine | Admitting: Internal Medicine

## 2018-07-29 ENCOUNTER — Other Ambulatory Visit: Payer: Self-pay

## 2018-07-29 DIAGNOSIS — K766 Portal hypertension: Secondary | ICD-10-CM | POA: Insufficient documentation

## 2018-07-29 DIAGNOSIS — K7460 Unspecified cirrhosis of liver: Secondary | ICD-10-CM | POA: Insufficient documentation

## 2019-01-18 ENCOUNTER — Other Ambulatory Visit: Payer: Self-pay | Admitting: Physical Medicine and Rehabilitation

## 2019-01-18 DIAGNOSIS — M5416 Radiculopathy, lumbar region: Secondary | ICD-10-CM

## 2019-01-31 ENCOUNTER — Other Ambulatory Visit: Payer: Self-pay

## 2019-01-31 ENCOUNTER — Ambulatory Visit
Admission: RE | Admit: 2019-01-31 | Discharge: 2019-01-31 | Disposition: A | Discharge status: Home / Self Care | Payer: Medicare HMO | Source: Ambulatory Visit | Attending: Physical Medicine and Rehabilitation | Admitting: Physical Medicine and Rehabilitation

## 2019-01-31 DIAGNOSIS — M5416 Radiculopathy, lumbar region: Secondary | ICD-10-CM | POA: Diagnosis not present

## 2019-02-01 ENCOUNTER — Other Ambulatory Visit: Payer: Self-pay | Admitting: Gastroenterology

## 2019-02-01 ENCOUNTER — Other Ambulatory Visit (HOSPITAL_COMMUNITY): Payer: Self-pay | Admitting: Gastroenterology

## 2019-02-01 DIAGNOSIS — K766 Portal hypertension: Secondary | ICD-10-CM

## 2019-02-01 DIAGNOSIS — K7460 Unspecified cirrhosis of liver: Secondary | ICD-10-CM

## 2019-02-09 ENCOUNTER — Ambulatory Visit: Admission: RE | Admit: 2019-02-09 | Payer: Medicare HMO | Source: Ambulatory Visit

## 2019-02-09 ENCOUNTER — Ambulatory Visit: Payer: Medicare HMO

## 2019-03-07 ENCOUNTER — Other Ambulatory Visit: Payer: Self-pay | Admitting: Orthopedic Surgery

## 2019-03-10 ENCOUNTER — Encounter
Admission: RE | Admit: 2019-03-10 | Discharge: 2019-03-10 | Disposition: A | Discharge status: Home / Self Care | Payer: Medicare HMO | Source: Ambulatory Visit | Attending: Orthopedic Surgery | Admitting: Orthopedic Surgery

## 2019-03-10 ENCOUNTER — Other Ambulatory Visit: Payer: Self-pay

## 2019-03-10 HISTORY — DX: Cardiac murmur, unspecified: R01.1

## 2019-03-10 HISTORY — DX: Cardiac arrhythmia, unspecified: I49.9

## 2019-03-10 HISTORY — DX: Atherosclerotic heart disease of native coronary artery without angina pectoris: I25.10

## 2019-03-10 HISTORY — DX: Localized edema: R60.0

## 2019-03-10 HISTORY — DX: Chronic kidney disease, unspecified: N18.9

## 2019-03-10 NOTE — Patient Instructions (Addendum)
Your procedure is scheduled on: 03/17/2019 Fri Report to Same Day Surgery 2nd floor medical mall [facility] Entrance-take elevator on left to 2nd floor.  Check in with surgery information desk.) To find out your arrival time please call [phone] between 1PM - 3PM on 03/16/2019 Thurs  Remember: Instructions that are not followed completely may result in serious medical risk, up to and including death, or upon the discretion of your surgeon and anesthesiologist your surgery may need to be rescheduled.    _x___ 1. Do not eat food after midnight the night before your procedure. You may drink clear liquids up to 2 hours before you are scheduled to arrive at the hospital for your procedure.  Do not drink clear liquids within 2 hours of your scheduled arrival to the hospital.  Clear liquids include  --Water or Apple juice without pulp  --Clear carbohydrate beverage such as ClearFast or Gatorade  --Black Coffee or Clear Tea (No milk, no creamers, do not add anything to                  the coffee or Tea Type 1 and type 2 diabetics should only drink water.   ____Ensure clear carbohydrate drink on the way to the hospital for bariatric patients Complete carbohydrate drink 2 hours before coming to hospital.  _x___Ensure clear carbohydrate drink 3 hours before surgery.   No gum chewing or hard candies.     __x__ 2. No Alcohol for 24 hours before or after surgery.   __x__3. No Smoking or e-cigarettes for 24 prior to surgery.  Do not use any chewable tobacco products for at least 6 hour prior to surgery   ____  4. Bring all medications with you on the day of surgery if instructed.    __x__ 5. Notify your doctor if there is any change in your medical condition     (cold, fever, infections).    x___6. On the morning of surgery brush your teeth with toothpaste and water.  You may rinse your mouth with mouth wash if you wish.  Do not swallow any toothpaste or mouthwash.   Do not wear jewelry,  make-up, hairpins, clips or nail polish.  Do not wear lotions, powders, or perfumes. You may wear deodorant.  Do not shave 48 hours prior to surgery. Men may shave face and neck.  Do not bring valuables to the hospital.    [facility] is not responsible for any belongings or valuables.               Contacts, dentures or bridgework may not be worn into surgery.  Leave your suitcase in the car. After surgery it may be brought to your room.  For patients admitted to the hospital, discharge time is determined by your                       treatment team.  _  Patients discharged the day of surgery will not be allowed to drive home.  You will need someone to drive you home and stay with you the night of your procedure.    Please read over the following fact sheets that you were given:   [facility] Preparing for Surgery and or MRSA Information   _x___ Take anti-hypertensive listed below, cardiac, seizure, asthma,     anti-reflux and psychiatric medicines. These include:  1. carvedilol (COREG) 3.125 MG tablet  2.fluticasone (FLONASE) 50 MCG/ACT nasal spray  3.levothyroxine (SYNTHROID, LEVOTHROID) 75  MCG tablet  4.  5.  6.  ____Fleets enema or Magnesium Citrate as directed.   _x___ Use CHG Soap or sage wipes as directed on instruction sheet   ____ Use inhalers on the day of surgery and bring to hospital day of surgery  ____ Stop Metformin and Janumet 2 days prior to surgery.    ____ Take 1/2 of usual insulin dose the night before surgery and none on the morning     surgery.   _x___ Follow recommendations from Cardiologist, Pulmonologist or PCP regarding          stopping Aspirin, Coumadin, Plavix ,Eliquis, Effient, or Pradaxa, and Pletal.  X____Stop Anti-inflammatories such as Advil, Aleve, Ibuprofen, Motrin, Naproxen, Naprosyn, Goodies powders or aspirin products. OK to take Tylenol and                          Celebrex.   _x___ Stop supplements until after surgery.  But may continue  Vitamin D, Vitamin B,       and multivitamin.   ____ Bring C-Pap to the hospital.

## 2019-03-14 ENCOUNTER — Other Ambulatory Visit: Payer: Self-pay

## 2019-03-14 ENCOUNTER — Other Ambulatory Visit
Admission: RE | Admit: 2019-03-14 | Discharge: 2019-03-14 | Disposition: A | Discharge status: Home / Self Care | Payer: Medicare HMO | Source: Ambulatory Visit | Attending: Orthopedic Surgery | Admitting: Orthopedic Surgery

## 2019-03-14 DIAGNOSIS — Z20828 Contact with and (suspected) exposure to other viral communicable diseases: Secondary | ICD-10-CM | POA: Diagnosis not present

## 2019-03-14 DIAGNOSIS — Z01812 Encounter for preprocedural laboratory examination: Secondary | ICD-10-CM | POA: Diagnosis present

## 2019-03-14 LAB — SARS CORONAVIRUS 2 (TAT 6-24 HRS): SARS Coronavirus 2: NEGATIVE

## 2019-03-16 MED ORDER — CLINDAMYCIN PHOSPHATE 900 MG/50ML IV SOLN: 900.0000 mg | INTRAVENOUS | Status: DC

## 2019-03-17 ENCOUNTER — Ambulatory Visit: Payer: Medicare HMO | Admitting: Certified Registered"

## 2019-03-17 ENCOUNTER — Encounter
Admission: RE | Disposition: A | Discharge status: Home / Self Care | Payer: Self-pay | Source: Home / Self Care | Attending: Orthopedic Surgery

## 2019-03-17 ENCOUNTER — Ambulatory Visit
Admission: RE | Admit: 2019-03-17 | Discharge: 2019-03-17 | Disposition: A | Discharge status: Home / Self Care | Payer: Medicare HMO | Attending: Orthopedic Surgery | Admitting: Orthopedic Surgery

## 2019-03-17 ENCOUNTER — Ambulatory Visit: Payer: Medicare HMO

## 2019-03-17 DIAGNOSIS — I4892 Unspecified atrial flutter: Secondary | ICD-10-CM | POA: Insufficient documentation

## 2019-03-17 DIAGNOSIS — I2510 Atherosclerotic heart disease of native coronary artery without angina pectoris: Secondary | ICD-10-CM | POA: Diagnosis not present

## 2019-03-17 DIAGNOSIS — K7460 Unspecified cirrhosis of liver: Secondary | ICD-10-CM | POA: Diagnosis not present

## 2019-03-17 DIAGNOSIS — G4730 Sleep apnea, unspecified: Secondary | ICD-10-CM | POA: Diagnosis not present

## 2019-03-17 DIAGNOSIS — Z85828 Personal history of other malignant neoplasm of skin: Secondary | ICD-10-CM | POA: Insufficient documentation

## 2019-03-17 DIAGNOSIS — E785 Hyperlipidemia, unspecified: Secondary | ICD-10-CM | POA: Diagnosis not present

## 2019-03-17 DIAGNOSIS — N183 Chronic kidney disease, stage 3 unspecified: Secondary | ICD-10-CM | POA: Diagnosis not present

## 2019-03-17 DIAGNOSIS — H9190 Unspecified hearing loss, unspecified ear: Secondary | ICD-10-CM | POA: Diagnosis not present

## 2019-03-17 DIAGNOSIS — I130 Hypertensive heart and chronic kidney disease with heart failure and stage 1 through stage 4 chronic kidney disease, or unspecified chronic kidney disease: Secondary | ICD-10-CM | POA: Diagnosis not present

## 2019-03-17 DIAGNOSIS — I480 Paroxysmal atrial fibrillation: Secondary | ICD-10-CM | POA: Insufficient documentation

## 2019-03-17 DIAGNOSIS — I5022 Chronic systolic (congestive) heart failure: Secondary | ICD-10-CM | POA: Diagnosis not present

## 2019-03-17 DIAGNOSIS — Z79899 Other long term (current) drug therapy: Secondary | ICD-10-CM | POA: Insufficient documentation

## 2019-03-17 DIAGNOSIS — E890 Postprocedural hypothyroidism: Secondary | ICD-10-CM | POA: Insufficient documentation

## 2019-03-17 DIAGNOSIS — Y838 Other surgical procedures as the cause of abnormal reaction of the patient, or of later complication, without mention of misadventure at the time of the procedure: Secondary | ICD-10-CM | POA: Insufficient documentation

## 2019-03-17 DIAGNOSIS — S32020A Wedge compression fracture of second lumbar vertebra, initial encounter for closed fracture: Secondary | ICD-10-CM | POA: Insufficient documentation

## 2019-03-17 DIAGNOSIS — Z419 Encounter for procedure for purposes other than remedying health state, unspecified: Secondary | ICD-10-CM

## 2019-03-17 DIAGNOSIS — X58XXXA Exposure to other specified factors, initial encounter: Secondary | ICD-10-CM | POA: Insufficient documentation

## 2019-03-17 DIAGNOSIS — I252 Old myocardial infarction: Secondary | ICD-10-CM | POA: Insufficient documentation

## 2019-03-17 DIAGNOSIS — Z79890 Hormone replacement therapy (postmenopausal): Secondary | ICD-10-CM | POA: Insufficient documentation

## 2019-03-17 DIAGNOSIS — I447 Left bundle-branch block, unspecified: Secondary | ICD-10-CM | POA: Diagnosis not present

## 2019-03-17 HISTORY — PX: KYPHOPLASTY: SHX5884

## 2019-03-17 SURGERY — KYPHOPLASTY
Anesthesia: Monitor Anesthesia Care | Site: Back

## 2019-03-17 MED ORDER — CLINDAMYCIN PHOSPHATE 900 MG/50ML IV SOLN
INTRAVENOUS | Status: DC | PRN
  Administered 2019-03-17: 900 mg via INTRAVENOUS

## 2019-03-17 MED ORDER — PROPOFOL 10 MG/ML IV BOLUS
INTRAVENOUS | Status: AC
  Filled 2019-03-17: qty 40

## 2019-03-17 MED ORDER — FENTANYL CITRATE (PF) 100 MCG/2ML IJ SOLN
INTRAMUSCULAR | Status: AC
  Filled 2019-03-17: qty 2

## 2019-03-17 MED ORDER — LACTATED RINGERS IV SOLN: INTRAVENOUS | Status: DC

## 2019-03-17 MED ORDER — PROPOFOL 500 MG/50ML IV EMUL
INTRAVENOUS | Status: DC | PRN
  Administered 2019-03-17: 50 ug/kg/min via INTRAVENOUS

## 2019-03-17 MED ORDER — FAMOTIDINE 20 MG PO TABS
20.0000 mg | ORAL_TABLET | Freq: Once | ORAL | Status: AC
  Administered 2019-03-17: 20 mg via ORAL

## 2019-03-17 MED ORDER — LIDOCAINE HCL 1 % IJ SOLN
INTRAMUSCULAR | Status: DC | PRN
  Administered 2019-03-17: 30 mL

## 2019-03-17 MED ORDER — BUPIVACAINE-EPINEPHRINE (PF) 0.5% -1:200000 IJ SOLN
INTRAMUSCULAR | Status: DC | PRN
  Administered 2019-03-17: 20 mL

## 2019-03-17 MED ORDER — CHLORHEXIDINE GLUCONATE 4 % EX LIQD
60.0000 mL | Freq: Once | CUTANEOUS | Status: AC
  Administered 2019-03-17: 4 via TOPICAL

## 2019-03-17 MED ORDER — PROPOFOL 10 MG/ML IV BOLUS
INTRAVENOUS | Status: AC
  Filled 2019-03-17: qty 20

## 2019-03-17 MED ORDER — IOHEXOL 180 MG/ML  SOLN
INTRAMUSCULAR | Status: DC | PRN
  Administered 2019-03-17: 30 mL

## 2019-03-17 MED ORDER — FENTANYL CITRATE (PF) 100 MCG/2ML IJ SOLN
INTRAMUSCULAR | Status: DC | PRN
  Administered 2019-03-17: 10 ug via INTRAVENOUS

## 2019-03-17 MED ORDER — CLINDAMYCIN PHOSPHATE 900 MG/50ML IV SOLN
INTRAVENOUS | Status: AC
  Filled 2019-03-17: qty 50

## 2019-03-17 MED ORDER — FAMOTIDINE 20 MG PO TABS
ORAL_TABLET | ORAL | Status: AC
  Filled 2019-03-17: qty 1

## 2019-03-17 SURGICAL SUPPLY — 20 items
ADH SKN CLS APL DERMABOND .7 (GAUZE/BANDAGES/DRESSINGS) ×1
CEMENT KYPHON CX01A KIT/MIXER (Cement) ×2 IMPLANT
COVER WAND RF STERILE (DRAPES) ×2 IMPLANT
DERMABOND ADVANCED (GAUZE/BANDAGES/DRESSINGS) ×1
DERMABOND ADVANCED .7 DNX12 (GAUZE/BANDAGES/DRESSINGS) ×1 IMPLANT
DEVICE BIOPSY BONE KYPHX (INSTRUMENTS) ×2 IMPLANT
DRAPE C-ARM XRAY 36X54 (DRAPES) ×2 IMPLANT
DURAPREP 26ML APPLICATOR (WOUND CARE) ×2 IMPLANT
FEE RENTAL RFA GENERATOR (MISCELLANEOUS) IMPLANT
GLOVE SURG SYN 9.0  PF PI (GLOVE) ×1
GLOVE SURG SYN 9.0 PF PI (GLOVE) ×1 IMPLANT
GOWN SRG 2XL LVL 4 RGLN SLV (GOWNS) ×1 IMPLANT
GOWN STRL NON-REIN 2XL LVL4 (GOWNS) ×2
GOWN STRL REUS W/ TWL LRG LVL3 (GOWN DISPOSABLE) ×1 IMPLANT
GOWN STRL REUS W/TWL LRG LVL3 (GOWN DISPOSABLE) ×2
PACK KYPHOPLASTY (MISCELLANEOUS) ×2 IMPLANT
RENTAL RFA GENERATOR (MISCELLANEOUS) IMPLANT
STRAP SAFETY 5IN WIDE (MISCELLANEOUS) ×2 IMPLANT
TRAY KYPHOPAK 15/3 EXPRESS 1ST (MISCELLANEOUS) ×2 IMPLANT
TRAY KYPHOPAK 20/3 EXPRESS 1ST (MISCELLANEOUS) ×1 IMPLANT

## 2019-03-17 NOTE — Anesthesia Post-op Follow-up Note (Signed)
Anesthesia QCDR form completed.        

## 2019-03-17 NOTE — Anesthesia Postprocedure Evaluation (Signed)
Anesthesia Post Note  Patient: Desiree Koch  Procedure(s) Performed: KYPHOPLASTY L2 (N/A Back)  Patient location during evaluation: PACU Anesthesia Type: MAC Level of consciousness: awake and alert Pain management: pain level controlled Vital Signs Assessment: post-procedure vital signs reviewed and stable Respiratory status: spontaneous breathing, nonlabored ventilation, respiratory function stable and patient connected to nasal cannula oxygen Cardiovascular status: blood pressure returned to baseline and stable Postop Assessment: no apparent nausea or vomiting Anesthetic complications: no     Last Vitals:  Vitals:   03/17/19 1438 03/17/19 1448  BP: 119/79 (!) 124/91  Pulse: 96 96  Resp: 17 14  Temp:    SpO2: 100% 100%    Last Pain:  Vitals:   03/17/19 1448  TempSrc:   PainSc: 0-No pain                 Molli Barrows

## 2019-03-17 NOTE — Anesthesia Preprocedure Evaluation (Signed)
Anesthesia Evaluation  Patient identified by MRN, date of birth, ID band Patient awake    Reviewed: Allergy & Precautions, H&P , NPO status , Patient's Chart, lab work & pertinent test results, reviewed documented beta blocker date and time   Airway Mallampati: II  TM Distance: >3 FB Neck ROM: full    Dental no notable dental hx. (+) Teeth Intact   Pulmonary pneumonia, resolved,    Pulmonary exam normal breath sounds clear to auscultation       Cardiovascular Exercise Tolerance: Good hypertension, + CAD and + Past MI  + dysrhythmias + Valvular Problems/Murmurs  Rhythm:regular Rate:Normal     Neuro/Psych negative neurological ROS  negative psych ROS   GI/Hepatic negative GI ROS, Neg liver ROS,   Endo/Other  diabetesHypothyroidism   Renal/GU Renal disease     Musculoskeletal   Abdominal   Peds  Hematology negative hematology ROS (+)   Anesthesia Other Findings   Reproductive/Obstetrics negative OB ROS                             Anesthesia Physical Anesthesia Plan  ASA: III  Anesthesia Plan: MAC   Post-op Pain Management:    Induction:   PONV Risk Score and Plan:   Airway Management Planned:   Additional Equipment:   Intra-op Plan:   Post-operative Plan:   Informed Consent: I have reviewed the patients History and Physical, chart, labs and discussed the procedure including the risks, benefits and alternatives for the proposed anesthesia with the patient or authorized representative who has indicated his/her understanding and acceptance.       Plan Discussed with: CRNA  Anesthesia Plan Comments:         Anesthesia Quick Evaluation

## 2019-03-17 NOTE — Progress Notes (Signed)
Up to BSC.

## 2019-03-17 NOTE — Transfer of Care (Signed)
Immediate Anesthesia Transfer of Care Note  Patient: Desiree Koch  Procedure(s) Performed: KYPHOPLASTY L2 (N/A Back)  Patient Location: PACU  Anesthesia Type:MAC  Level of Consciousness: awake, alert , oriented and drowsy  Airway & Oxygen Therapy: Patient Spontanous Breathing  Post-op Assessment: Report given to RN and Post -op Vital signs reviewed and stable  Post vital signs: Reviewed and stable  Last Vitals:  Vitals Value Taken Time  BP 124/77 03/17/19 1340  Temp 36 C 03/17/19 1340  Pulse 93 03/17/19 1341  Resp    SpO2 99 % 03/17/19 1341  Vitals shown include unvalidated device data.  Last Pain:  Vitals:   03/17/19 1340  TempSrc:   PainSc: 0-No pain         Complications: No apparent anesthesia complications

## 2019-03-17 NOTE — Discharge Instructions (Addendum)
Take it easy today and tomorrow.  Resume more normal activities with no lifting over 5 pounds for 2 weeks starting Sunday.  Remove Band-Aids on Sunday then okay to shower.  Tylenol as needed for pain.    AMBULATORY SURGERY  DISCHARGE INSTRUCTIONS   1) The drugs that you were given will stay in your system until tomorrow so for the next 24 hours you should not:  A) Drive an automobile B) Make any legal decisions C) Drink any alcoholic beverage   2) You may resume regular meals tomorrow.  Today it is better to start with liquids and gradually work up to solid foods.  You may eat anything you prefer, but it is better to start with liquids, then soup and crackers, and gradually work up to solid foods.   3) Please notify your doctor immediately if you have any unusual bleeding, trouble breathing, redness and pain at the surgery site, drainage, fever, or pain not relieved by medication.    4) Additional Instructions:        Please contact your physician with any problems or Same Day Surgery at [phone], Monday through Friday 6 am to 4 pm, or [place] [facility] number at [phone].

## 2019-03-17 NOTE — Op Note (Signed)
Date 03/17/2019  time 1:31 PM   PATIENT:  Desiree Koch   PRE-OPERATIVE DIAGNOSIS:  closed wedge compression fracture of L2   POST-OPERATIVE DIAGNOSIS:  closed wedge compression fracture of L2   PROCEDURE:  Procedure(s): KYPHOPLASTY L2  SURGEON: Laurene Footman, MD   ASSISTANTS: None   ANESTHESIA:   local and MAC   EBL:  No intake/output data recorded.   BLOOD ADMINISTERED:none   DRAINS: none    LOCAL MEDICATIONS USED:  MARCAINE    and XYLOCAINE    SPECIMEN:   L2 vertebral body   DISPOSITION OF SPECIMEN:  Pathology   COUNTS:  YES   TOURNIQUET:  * No tourniquets in log *   IMPLANTS: Bone cement   DICTATION: .Dragon Dictation  patient was brought to the operating room and after adequate anesthesia was obtained the patient was placed prone.  C arm was brought in in good visualization of the affected level obtained on both AP and lateral projections.  After patient identification and timeout procedures were completed, local anesthetic was infiltrated with 10 cc 1% Xylocaine infiltrated subcutaneously.  This is done the area on the each side of the planned approach.  The back was then prepped and draped in the usual sterile manner and repeat timeout procedure carried out.  A spinal needle was brought down to the pedicle on the each side of  L2 and a 50-50 mix of 1% Xylocaine half percent Sensorcaine with epinephrine total of 20 cc injected.  After allowing this to set a small incision was made and the trocar was advanced into the vertebral body in an extrapedicular fashion on each side.  Biopsy was obtained Drilling was carried out balloon inserted with inflation to 2 cc on each side.  When the cement was appropriate consistency 5 cc were injected into the vertebral body with small amount of extravasation on the right side by the pedicle, good fill superior to inferior endplates and from right to left sides along the inferior endplate.  After the cement had set the trochar was removed  and permanent C-arm views obtained.  The wound was closed with Dermabond followed by Band-Aid   PLAN OF CARE: Discharge to home after PACU   PATIENT DISPOSITION:  PACU - hemodynamically stable.

## 2019-03-17 NOTE — Consult Note (Signed)
[facility] Initial Consultation Note   Patient Name: Desiree Koch    [id number] PCP: Adin Hector, MD     DOB: 10-23-1930  DOA: 03/17/2019 DOS: the patient was seen and examined on 03/17/2019   Referring physician: Hessie Knows, MD  Reason for consult: atrial fibrillation   HPI: Desiree Koch is a 85 y.o. female with Past medical history of CAD, atrial flutter, chronic systolic CHF, HTN, hypothyroidism, chronic leg swelling, osteoporosis. Patient is coming from Home Patient presented for outpatient kyphoplasty.  Patient had sustained an L2 compression fracture.  She has seen orthopedic outpatient on 02/17/2019 and was scheduled for outpatient kyphoplasty scheduled today. After the procedure on routine evaluation patient was found to be having A. fib with rate controlled. Since there was no documentation in: Counseling about A. fib and medical history we were asked to see the patient. At the time of my evaluation patient did not have any complaints of chest pain nausea vomiting fever chills diarrhea constipation or change in the medicines lately. Patient was actually given cortisone shots 4 weeks ago after which she started having some swelling in her legs which is worse than her baseline. She denies any calf tenderness denies any dizziness or lightheadedness. On further chart review it was found that the patient does have history of atrial flutter which is paroxysmal in nature and patient is on Coreg for rate control for the same. Patient is not on any anticoagulation possibly due to her frequent falls. No focal deficit on examination.  Patient does not know when she goes in and out of A.  Flutter.  Review of Systems: as mentioned in the history of present illness.  All other systems reviewed and are negative.  Past Medical History:  Diagnosis Date  . Arthritis   . Cancer ([place]) 2018   skin ca  . Cardiac arrhythmia   . Coronary artery disease   . CRD (chronic  renal disease)   . Dysrhythmia   . Heart murmur   . HOH (hard of hearing)   . Hypercholesteremia   . Hypertension   . Hypothyroidism   . Leaky heart valve   . Liver damage   . Lower extremity edema   . Multiple allergies   . Myocardial infarction ([place])   . Osteoporosis   . Pneumonia    Past Surgical History:  Procedure Laterality Date  . ABDOMINAL HYSTERECTOMY    . CATARACT EXTRACTION W/PHACO Left 09/12/2015   Procedure: CATARACT EXTRACTION PHACO AND INTRAOCULAR LENS PLACEMENT (IOC);  Surgeon: Eulogio Bear, MD;  Location: [facility] ORS;  Service: Ophthalmology;  Laterality: Left;  Lot# [id number] H [place]: 01:02.0 AP%:15.3 CDE: 9.45  . CATARACT EXTRACTION W/PHACO Right 10/31/2015   Procedure: CATARACT EXTRACTION PHACO AND INTRAOCULAR LENS PLACEMENT (IOC);  Surgeon: Eulogio Bear, MD;  Location: [facility] ORS;  Service: Ophthalmology;  Laterality: Right;  [place] 01:16.2 AP% 14.8 CDE 11.27 Fluid Pack Lot # [id number]  . CESAREAN SECTION    . CHOLECYSTECTOMY    . KNEE ARTHROSCOPY    . THYROIDECTOMY     Social History:  reports that she has never smoked. She has never used smokeless tobacco. She reports that she does not drink alcohol or use drugs.  Allergies  Allergen Reactions  . Amlodipine Swelling  . Clonidine Derivatives Swelling  . Norvasc [Amlodipine Besylate] Swelling  . Aspirin Other (See Comments)    Blood pressure goes up  . Celebrex [Celecoxib] Other (See Comments)    Blood pressure goes  up  . Codeine Swelling  . Propoxyphene Swelling  . Latex Other (See Comments)    NEGATIVE RASH TEST 09/06/15  . Penicillins Other (See Comments)    Patient does not want medication Did it involve swelling of the face/tongue/throat, SOB, or low BP? Unknown Did it involve sudden or severe rash/hives, skin peeling, or any reaction on the inside of your mouth or nose? Unknown Did you need to seek medical attention at a hospital or doctor's office? Unknown When did it last  happen?childhood If all above answers are "NO", may proceed with cephalosporin use.  . Ace Inhibitors Cough  . Ciprofloxacin Palpitations  . Flexeril [Cyclobenzaprine] Other (See Comments)    Blood pressure goes up  . Hctz [Hydrochlorothiazide] Other (See Comments)    Unknown  . Lisinopril Other (See Comments)    Susceptible to sun  . Meperidine And Related Other (See Comments)    Unknown  . Pravastatin Rash  . Sulfa Antibiotics Rash   Family history reviewed and not pertinent Family History  Problem Relation Age of Onset  . Breast cancer Neg Hx     Prior to Admission medications   Medication Sig Start Date End Date Taking? Authorizing Provider  acetaminophen (TYLENOL) 325 MG tablet Take 325-650 mg by mouth every 6 (six) hours as needed for mild pain or moderate pain.    Yes [provider]  carvedilol (COREG) 3.125 MG tablet Take 3.125 mg by mouth 2 (two) times daily with a meal.   Yes [provider]  cholecalciferol (VITAMIN D3) 25 MCG (1000 UT) tablet Take 2,000 Units by mouth daily.    Yes [provider]  fexofenadine (ALLEGRA) 180 MG tablet Take 180 mg by mouth at bedtime.    Yes [provider]  fluticasone (FLONASE) 50 MCG/ACT nasal spray Place 2 sprays into both nostrils daily as needed for rhinitis.    Yes [provider]  furosemide (LASIX) 20 MG tablet Take 40 mg by mouth daily.    Yes [provider]  Glucosamine 500 MG CAPS Take 500 mg by mouth daily.    Yes [provider]  levothyroxine (SYNTHROID, LEVOTHROID) 75 MCG tablet Take 75 mcg by mouth daily.   Yes [provider]  Multiple Vitamins-Minerals (PRESERVISION AREDS 2+MULTI VIT PO) Take 2 tablets by mouth daily.   Yes [provider]  telmisartan (MICARDIS) 80 MG tablet Take 80 mg by mouth daily. Order from [place]   Yes [provider]  cefdinir (OMNICEF) 300 MG capsule Take 1 capsule (300 mg total) by mouth 2 (two)  times daily. Patient not taking: Reported on 03/08/2019 06/18/17   Loney Hering D, MD    Physical Exam: Vitals:   03/17/19 1538 03/17/19 1548 03/17/19 1600 03/17/19 1630  BP: 109/79 115/76 125/79 123/78  Pulse: (!) 107 (!) 107 95 96  Resp: (!) 33 (!) 26 18   Temp:   98.3 F (36.8 C) 98.3 F (36.8 C)  TempSrc:      SpO2: 97% 100% 100%   Weight:      Height:       General: alert and oriented to time, place, and person. Appear in no distress, affect appropriate Eyes: PERRL, Conjunctiva normal ENT: Oral Mucosa Clear, moist  Neck: no JVD, no Abnormal Mass Or lumps Cardiovascular: S1 and S2 Present, no Murmur, peripheral pulses symmetrical Respiratory: normal respiratory effort, Bilateral Air entry equal and Decreased, no use of accessory muscle, Clear to Auscultation, no Crackles, no wheezes Abdomen:  Bowel Sound present, Soft and no tenderness, no hernia Skin: no rashes  Extremities: no Pedal edema, no calf tenderness Neurologic: without any new focal findings Gait not checked due to patient safety concerns   Labs:  CBC: No results for input(s): WBC, NEUTROABS, HGB, HCT, MCV, PLT in the last 168 hours. Basic Metabolic Panel: No results for input(s): NA, K, CL, CO2, GLUCOSE, BUN, CREATININE, CALCIUM, MG, PHOS in the last 168 hours. Liver Function Tests: No results for input(s): AST, ALT, ALKPHOS, BILITOT, PROT, ALBUMIN in the last 168 hours. No results for input(s): LIPASE, AMYLASE in the last 168 hours. No results for input(s): AMMONIA in the last 168 hours.  Cardiac Enzymes: No results for input(s): CKTOTAL, CKMB, CKMBINDEX, TROPONINI in the last 168 hours. No results for input(s): PROBNP in the last 8760 hours.  CBG: No results for input(s): GLUCAP in the last 168 hours.  Radiological Exams: DG Lumbar Spine 2-3 Views  Result Date: 03/17/2019 CLINICAL DATA:  L2 kyphoplasty. EXAM: DG C-ARM 1-60 MIN; LUMBAR SPINE - 2-3 VIEW CONTRAST:  None. FLUOROSCOPY TIME:  Fluoroscopy  Time:  1 minutes 45 seconds Radiation Exposure Index (if provided by the fluoroscopic device): 8.5 mGy Number of Acquired Spot Images: 2 COMPARISON:  MR lumbar spine 01/31/2019 FINDINGS: Examination demonstrates spondylosis of the lumbar spine. There is evidence of patient's known L2 compression fracture post kyphoplasty with radiopaque material within the confines of the L2 vertebral body. IMPRESSION: Findings compatible with recent L2 kyphoplasty. Electronically Signed   By: Marin Olp M.D.   On: 03/17/2019 13:47   DG C-Arm 1-60 Min  Result Date: 03/17/2019 CLINICAL DATA:  L2 kyphoplasty. EXAM: DG C-ARM 1-60 MIN; LUMBAR SPINE - 2-3 VIEW CONTRAST:  None. FLUOROSCOPY TIME:  Fluoroscopy Time:  1 minutes 45 seconds Radiation Exposure Index (if provided by the fluoroscopic device): 8.5 mGy Number of Acquired Spot Images: 2 COMPARISON:  MR lumbar spine 01/31/2019 FINDINGS: Examination demonstrates spondylosis of the lumbar spine. There is evidence of patient's known L2 compression fracture post kyphoplasty with radiopaque material within the confines of the L2 vertebral body. IMPRESSION: Findings compatible with recent L2 kyphoplasty. Electronically Signed   By: Marin Olp M.D.   On: 03/17/2019 13:47    EKG: Independently reviewed. atrial fibrillation, rate controlled.  Assessment/Plan 1.  Paroxysmal A. fib Paroxysmal a flutter HFrEF Cirrhosis Chronic kidney disease stage III HLD HTN LBBB Discussed with patient primary cardiologist on phone. Patient does have prior history of paroxysmal regular heart rhythm. Patient is on a beta-blocker. Patient currently asymptomatic and does not appear to have any ongoing chest pain or shortness of breath. Currently based on my discussion with patient's primary cardiologist no indication for further work-up in the hospital. Outpatient follow-up with primary cardiologist in 2 weeks recommended. Continue current management. Continue TED  stockings. Anticipating improvement in volume overload situation created by steroids. Patient currently not on anticoagulation.  Recommend PCP to discuss outpatient regarding at possibilities of anticoagulation. Patient is cleared by orthopedics to start on anticoagulation starting 03/18/2019.  Family Communication: Discussed with patient's daughter on the phone Primary team communication: Discussed with primary team at bedside  Thank you very much for involving [place] in care of your patient.  We will sign off at present, please call us again as needed. Patient can be safely discharged from PACU directly back to home as deemed stable and safe by primary team.  Author: Berle Mull, MD [facility] 03/17/2019 4:53 PM    If 7PM-7AM, please contact night-coverage  [email or web address]

## 2019-03-17 NOTE — Progress Notes (Signed)
Moving all extremites well

## 2019-03-17 NOTE — Progress Notes (Signed)
Dr patel in to see pt   History of a flutter   Daughter called and updated    Dr patel called pt heart doctor and updated on pt

## 2019-03-17 NOTE — H&P (Signed)
Reviewed paper H+P, will be scanned into chart. No changes noted.  

## 2019-03-20 LAB — SURGICAL PATHOLOGY

## 2019-06-15 ENCOUNTER — Inpatient Hospital Stay
Admission: EM | Admit: 2019-06-15 | Discharge: 2019-06-20 | DRG: 291 | Disposition: A | Discharge status: Home / Self Care | Payer: Medicare HMO | Attending: Hospitalist | Admitting: Hospitalist

## 2019-06-15 ENCOUNTER — Emergency Department: Payer: Medicare HMO

## 2019-06-15 ENCOUNTER — Other Ambulatory Visit: Payer: Self-pay

## 2019-06-15 DIAGNOSIS — Z885 Allergy status to narcotic agent status: Secondary | ICD-10-CM | POA: Diagnosis not present

## 2019-06-15 DIAGNOSIS — I130 Hypertensive heart and chronic kidney disease with heart failure and stage 1 through stage 4 chronic kidney disease, or unspecified chronic kidney disease: Principal | ICD-10-CM | POA: Diagnosis present

## 2019-06-15 DIAGNOSIS — H9190 Unspecified hearing loss, unspecified ear: Secondary | ICD-10-CM | POA: Diagnosis present

## 2019-06-15 DIAGNOSIS — I5023 Acute on chronic systolic (congestive) heart failure: Secondary | ICD-10-CM | POA: Diagnosis present

## 2019-06-15 DIAGNOSIS — I081 Rheumatic disorders of both mitral and tricuspid valves: Secondary | ICD-10-CM | POA: Diagnosis present

## 2019-06-15 DIAGNOSIS — I25118 Atherosclerotic heart disease of native coronary artery with other forms of angina pectoris: Secondary | ICD-10-CM | POA: Diagnosis present

## 2019-06-15 DIAGNOSIS — E7800 Pure hypercholesterolemia, unspecified: Secondary | ICD-10-CM | POA: Diagnosis present

## 2019-06-15 DIAGNOSIS — T462X5A Adverse effect of other antidysrhythmic drugs, initial encounter: Secondary | ICD-10-CM | POA: Diagnosis present

## 2019-06-15 DIAGNOSIS — Z888 Allergy status to other drugs, medicaments and biological substances status: Secondary | ICD-10-CM

## 2019-06-15 DIAGNOSIS — I4892 Unspecified atrial flutter: Secondary | ICD-10-CM | POA: Diagnosis present

## 2019-06-15 DIAGNOSIS — Y92239 Unspecified place in hospital as the place of occurrence of the external cause: Secondary | ICD-10-CM | POA: Diagnosis not present

## 2019-06-15 DIAGNOSIS — K7469 Other cirrhosis of liver: Secondary | ICD-10-CM | POA: Diagnosis present

## 2019-06-15 DIAGNOSIS — X58XXXA Exposure to other specified factors, initial encounter: Secondary | ICD-10-CM | POA: Diagnosis present

## 2019-06-15 DIAGNOSIS — Z90710 Acquired absence of both cervix and uterus: Secondary | ICD-10-CM

## 2019-06-15 DIAGNOSIS — T502X5A Adverse effect of carbonic-anhydrase inhibitors, benzothiadiazides and other diuretics, initial encounter: Secondary | ICD-10-CM | POA: Diagnosis not present

## 2019-06-15 DIAGNOSIS — I480 Paroxysmal atrial fibrillation: Secondary | ICD-10-CM | POA: Diagnosis present

## 2019-06-15 DIAGNOSIS — R140 Abdominal distension (gaseous): Secondary | ICD-10-CM | POA: Diagnosis present

## 2019-06-15 DIAGNOSIS — I252 Old myocardial infarction: Secondary | ICD-10-CM

## 2019-06-15 DIAGNOSIS — D649 Anemia, unspecified: Secondary | ICD-10-CM | POA: Diagnosis present

## 2019-06-15 DIAGNOSIS — Z85828 Personal history of other malignant neoplasm of skin: Secondary | ICD-10-CM

## 2019-06-15 DIAGNOSIS — Z886 Allergy status to analgesic agent status: Secondary | ICD-10-CM

## 2019-06-15 DIAGNOSIS — Z961 Presence of intraocular lens: Secondary | ICD-10-CM | POA: Diagnosis present

## 2019-06-15 DIAGNOSIS — E890 Postprocedural hypothyroidism: Secondary | ICD-10-CM | POA: Diagnosis present

## 2019-06-15 DIAGNOSIS — E876 Hypokalemia: Secondary | ICD-10-CM | POA: Diagnosis not present

## 2019-06-15 DIAGNOSIS — Z9842 Cataract extraction status, left eye: Secondary | ICD-10-CM

## 2019-06-15 DIAGNOSIS — Z79899 Other long term (current) drug therapy: Secondary | ICD-10-CM

## 2019-06-15 DIAGNOSIS — I5043 Acute on chronic combined systolic (congestive) and diastolic (congestive) heart failure: Secondary | ICD-10-CM | POA: Diagnosis present

## 2019-06-15 DIAGNOSIS — M1990 Unspecified osteoarthritis, unspecified site: Secondary | ICD-10-CM | POA: Diagnosis present

## 2019-06-15 DIAGNOSIS — Z882 Allergy status to sulfonamides status: Secondary | ICD-10-CM

## 2019-06-15 DIAGNOSIS — R188 Other ascites: Secondary | ICD-10-CM | POA: Diagnosis present

## 2019-06-15 DIAGNOSIS — M810 Age-related osteoporosis without current pathological fracture: Secondary | ICD-10-CM | POA: Diagnosis present

## 2019-06-15 DIAGNOSIS — I482 Chronic atrial fibrillation, unspecified: Secondary | ICD-10-CM | POA: Diagnosis present

## 2019-06-15 DIAGNOSIS — I447 Left bundle-branch block, unspecified: Secondary | ICD-10-CM | POA: Diagnosis present

## 2019-06-15 DIAGNOSIS — I2510 Atherosclerotic heart disease of native coronary artery without angina pectoris: Secondary | ICD-10-CM | POA: Diagnosis present

## 2019-06-15 DIAGNOSIS — N179 Acute kidney failure, unspecified: Secondary | ICD-10-CM

## 2019-06-15 DIAGNOSIS — Z9841 Cataract extraction status, right eye: Secondary | ICD-10-CM

## 2019-06-15 DIAGNOSIS — Z880 Allergy status to penicillin: Secondary | ICD-10-CM | POA: Diagnosis not present

## 2019-06-15 DIAGNOSIS — Z9049 Acquired absence of other specified parts of digestive tract: Secondary | ICD-10-CM

## 2019-06-15 DIAGNOSIS — I5022 Chronic systolic (congestive) heart failure: Secondary | ICD-10-CM | POA: Diagnosis present

## 2019-06-15 DIAGNOSIS — Z79890 Hormone replacement therapy (postmenopausal): Secondary | ICD-10-CM

## 2019-06-15 DIAGNOSIS — N189 Chronic kidney disease, unspecified: Secondary | ICD-10-CM | POA: Diagnosis present

## 2019-06-15 DIAGNOSIS — R601 Generalized edema: Secondary | ICD-10-CM

## 2019-06-15 DIAGNOSIS — E039 Hypothyroidism, unspecified: Secondary | ICD-10-CM

## 2019-06-15 DIAGNOSIS — Z20822 Contact with and (suspected) exposure to covid-19: Secondary | ICD-10-CM | POA: Diagnosis present

## 2019-06-15 DIAGNOSIS — N1832 Chronic kidney disease, stage 3b: Secondary | ICD-10-CM

## 2019-06-15 DIAGNOSIS — I509 Heart failure, unspecified: Secondary | ICD-10-CM

## 2019-06-15 LAB — BASIC METABOLIC PANEL
Anion gap: 13 (ref 5–15)
BUN: 35 mg/dL — ABNORMAL HIGH (ref 8–23)
CO2: 27 mmol/L (ref 22–32)
Calcium: 9.4 mg/dL (ref 8.9–10.3)
Chloride: 94 mmol/L — ABNORMAL LOW (ref 98–111)
Creatinine, Ser: 2.38 mg/dL — ABNORMAL HIGH (ref 0.44–1.00)
GFR calc Af Amer: 21 mL/min — ABNORMAL LOW (ref >60)
GFR calc non Af Amer: 18 mL/min — ABNORMAL LOW (ref >60)
Glucose, Bld: 102 mg/dL — ABNORMAL HIGH (ref 70–99)
Potassium: 3.7 mmol/L (ref 3.5–5.1)
Sodium: 134 mmol/L — ABNORMAL LOW (ref 135–145)

## 2019-06-15 LAB — TSH: TSH: 2.979 u[IU]/mL (ref 0.350–4.500)

## 2019-06-15 LAB — TROPONIN I (HIGH SENSITIVITY)
Troponin I (High Sensitivity): 44 ng/L — ABNORMAL HIGH (ref <18)
Troponin I (High Sensitivity): 44 ng/L — ABNORMAL HIGH (ref <18)

## 2019-06-15 LAB — CBC
HCT: 32.9 % — ABNORMAL LOW (ref 36.0–46.0)
Hemoglobin: 11.4 g/dL — ABNORMAL LOW (ref 12.0–15.0)
MCH: 35.4 pg — ABNORMAL HIGH (ref 26.0–34.0)
MCHC: 34.7 g/dL (ref 30.0–36.0)
MCV: 102.2 fL — ABNORMAL HIGH (ref 80.0–100.0)
Platelets: 159 10*3/uL (ref 150–400)
RBC: 3.22 MIL/uL — ABNORMAL LOW (ref 3.87–5.11)
RDW: 15.9 % — ABNORMAL HIGH (ref 11.5–15.5)
WBC: 7.3 10*3/uL (ref 4.0–10.5)
nRBC: 0 % (ref 0.0–0.2)

## 2019-06-15 LAB — HEPATIC FUNCTION PANEL
ALT: 14 U/L (ref 0–44)
AST: 54 U/L — ABNORMAL HIGH (ref 15–41)
Albumin: 2.5 g/dL — ABNORMAL LOW (ref 3.5–5.0)
Alkaline Phosphatase: 79 U/L (ref 38–126)
Bilirubin, Direct: 0.7 mg/dL — ABNORMAL HIGH (ref 0.0–0.2)
Indirect Bilirubin: 2 mg/dL — ABNORMAL HIGH (ref 0.3–0.9)
Total Bilirubin: 2.7 mg/dL — ABNORMAL HIGH (ref 0.3–1.2)
Total Protein: 6.2 g/dL — ABNORMAL LOW (ref 6.5–8.1)

## 2019-06-15 LAB — RESPIRATORY PANEL BY RT PCR (FLU A&B, COVID)
Influenza A by PCR: NEGATIVE
Influenza B by PCR: NEGATIVE
SARS Coronavirus 2 by RT PCR: NEGATIVE

## 2019-06-15 LAB — BRAIN NATRIURETIC PEPTIDE: B Natriuretic Peptide: 924 pg/mL — ABNORMAL HIGH (ref 0.0–100.0)

## 2019-06-15 LAB — PROTIME-INR
INR: 1.2 (ref 0.8–1.2)
Prothrombin Time: 15.1 seconds (ref 11.4–15.2)

## 2019-06-15 MED ORDER — LORATADINE 10 MG PO TABS
10.0000 mg | ORAL_TABLET | Freq: Every day | ORAL | Status: DC
  Administered 2019-06-15 – 2019-06-20 (×6): 10 mg via ORAL
  Filled 2019-06-15 (×6): qty 1

## 2019-06-15 MED ORDER — OCUVITE-LUTEIN PO CAPS
1.0000 | ORAL_CAPSULE | Freq: Every day | ORAL | Status: DC
  Administered 2019-06-16 – 2019-06-20 (×5): 1 via ORAL
  Filled 2019-06-15 (×5): qty 1

## 2019-06-15 MED ORDER — HEPARIN SODIUM (PORCINE) 5000 UNIT/ML IJ SOLN
5000.0000 [IU] | Freq: Three times a day (TID) | INTRAMUSCULAR | Status: DC
  Administered 2019-06-15 – 2019-06-20 (×14): 5000 [IU] via SUBCUTANEOUS
  Filled 2019-06-15 (×14): qty 1

## 2019-06-15 MED ORDER — METOPROLOL TARTRATE 5 MG/5ML IV SOLN
2.5000 mg | Freq: Once | INTRAVENOUS | Status: DC
  Filled 2019-06-15: qty 5

## 2019-06-15 MED ORDER — POTASSIUM CHLORIDE CRYS ER 20 MEQ PO TBCR
20.0000 meq | EXTENDED_RELEASE_TABLET | Freq: Every day | ORAL | Status: DC
  Administered 2019-06-15 – 2019-06-20 (×6): 20 meq via ORAL
  Filled 2019-06-15 (×6): qty 1

## 2019-06-15 MED ORDER — METOPROLOL SUCCINATE ER 100 MG PO TB24
100.0000 mg | ORAL_TABLET | Freq: Two times a day (BID) | ORAL | Status: DC
  Administered 2019-06-15 – 2019-06-20 (×10): 100 mg via ORAL
  Filled 2019-06-15 (×7): qty 1
  Filled 2019-06-15: qty 2
  Filled 2019-06-15 (×2): qty 1

## 2019-06-15 MED ORDER — SODIUM CHLORIDE 0.9% FLUSH
3.0000 mL | Freq: Two times a day (BID) | INTRAVENOUS | Status: DC
  Administered 2019-06-15 – 2019-06-20 (×9): 3 mL via INTRAVENOUS

## 2019-06-15 MED ORDER — ACETAMINOPHEN 325 MG PO TABS: 325.0000 mg | ORAL_TABLET | Freq: Four times a day (QID) | ORAL | Status: DC | PRN

## 2019-06-15 MED ORDER — LEVOTHYROXINE SODIUM 75 MCG PO TABS
75.0000 ug | ORAL_TABLET | Freq: Every day | ORAL | Status: DC
  Administered 2019-06-16 – 2019-06-20 (×5): 75 ug via ORAL
  Filled 2019-06-15 (×2): qty 1
  Filled 2019-06-15 (×2): qty 3
  Filled 2019-06-15 (×3): qty 1
  Filled 2019-06-15 (×3): qty 3
  Filled 2019-06-15: qty 1

## 2019-06-15 MED ORDER — ACETAMINOPHEN 325 MG PO TABS: 650.0000 mg | ORAL_TABLET | ORAL | Status: DC | PRN

## 2019-06-15 MED ORDER — VITAMIN D 25 MCG (1000 UNIT) PO TABS
2000.0000 [IU] | ORAL_TABLET | Freq: Every day | ORAL | Status: DC
  Administered 2019-06-15 – 2019-06-20 (×6): 2000 [IU] via ORAL
  Filled 2019-06-15 (×6): qty 2

## 2019-06-15 MED ORDER — SODIUM CHLORIDE 0.9 % IV SOLN: 250.0000 mL | INTRAVENOUS | Status: DC | PRN

## 2019-06-15 MED ORDER — FLUTICASONE PROPIONATE 50 MCG/ACT NA SUSP
2.0000 | Freq: Every day | NASAL | Status: DC | PRN
  Filled 2019-06-15: qty 16

## 2019-06-15 MED ORDER — FUROSEMIDE 10 MG/ML IJ SOLN
40.0000 mg | Freq: Two times a day (BID) | INTRAMUSCULAR | Status: DC
  Administered 2019-06-16: 40 mg via INTRAVENOUS
  Filled 2019-06-15: qty 4

## 2019-06-15 MED ORDER — ONDANSETRON HCL 4 MG/2ML IJ SOLN: 4.0000 mg | Freq: Four times a day (QID) | INTRAMUSCULAR | Status: DC | PRN

## 2019-06-15 MED ORDER — SODIUM CHLORIDE 0.9% FLUSH: 3.0000 mL | INTRAVENOUS | Status: DC | PRN

## 2019-06-15 MED ORDER — FUROSEMIDE 10 MG/ML IJ SOLN
40.0000 mg | Freq: Once | INTRAMUSCULAR | Status: AC
  Administered 2019-06-15: 40 mg via INTRAVENOUS
  Filled 2019-06-15: qty 4

## 2019-06-15 NOTE — ED Triage Notes (Signed)
Pt arrives via [facility] from home with complaints of weakness for the past few days- pt was recently diagnosed with afib in the last couple months and per EMS pt showing afib on the monitor

## 2019-06-15 NOTE — Progress Notes (Signed)
Heart rate from 110-120, which has been consistent since admission.  MD aware.

## 2019-06-15 NOTE — ED Notes (Signed)
Attempted IV x2 with no success- another RN to look
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
## 2019-06-15 NOTE — ED Notes (Signed)
Pt given meal tray.

## 2019-06-15 NOTE — H&P (Signed)
History and Physical    Desiree Koch [id number] DOB: 1930/12/19 DOA: 06/15/2019  PCP: Adin Hector, MD   Patient coming from: Home  I have personally briefly reviewed patient's old medical records in [place]  Chief Complaint: Leg swelling  HPI: Desiree Koch is a 86 y.o. female with medical history significant for atrial fibrillation not on anticoagulation due to history of bleeding, chronic systolic heart failure with last known LVEF of 20%, hypothyroidism, hypertension, liver cirrhosis and coronary artery disease.  Patient was brought to the emergency room by her daughter for evaluation of worsening lower extremity swelling.  She is also concerned about palpitations despite an increase in the dose of her metoprolol.  Patient admits to a 10 pound weight gain and complains of shortness of breath with exertion.  She also complains of two-pillow orthopnea but denies having any PND. She has noted an increase in her abdominal girth. She denies having any fever or chills, no cough, no dizziness or lightheadedness. She denies having any abdominal pain, nausea, vomiting or changes in her bowel habits  ED Course: Patient was seen in the emergency room for evaluation of anasarca.  She was noted to be tachycardic and received one dose of metoprolol 2.5 mg IV.  She also received a dose of IV Lasix  Review of Systems: As per HPI otherwise 10 point review of systems negative.    Past Medical History:  Diagnosis Date  . Arthritis   . Cancer ([place]) 2018   skin ca  . Cardiac arrhythmia   . Coronary artery disease   . CRD (chronic renal disease)   . Dysrhythmia   . Heart murmur   . HOH (hard of hearing)   . Hypercholesteremia   . Hypertension   . Hypothyroidism   . Leaky heart valve   . Liver damage   . Lower extremity edema   . Multiple allergies   . Myocardial infarction ([place])   . Osteoporosis   . Pneumonia     Past Surgical History:  Procedure Laterality Date  .  ABDOMINAL HYSTERECTOMY    . CATARACT EXTRACTION W/PHACO Left 09/12/2015   Procedure: CATARACT EXTRACTION PHACO AND INTRAOCULAR LENS PLACEMENT (IOC);  Surgeon: Eulogio Bear, MD;  Location: [facility] ORS;  Service: Ophthalmology;  Laterality: Left;  Lot# [id number] H [place]: 01:02.0 AP%:15.3 CDE: 9.45  . CATARACT EXTRACTION W/PHACO Right 10/31/2015   Procedure: CATARACT EXTRACTION PHACO AND INTRAOCULAR LENS PLACEMENT (IOC);  Surgeon: Eulogio Bear, MD;  Location: [facility] ORS;  Service: Ophthalmology;  Laterality: Right;  [place] 01:16.2 AP% 14.8 CDE 11.27 Fluid Pack Lot # [id number]  . CESAREAN SECTION    . CHOLECYSTECTOMY    . KNEE ARTHROSCOPY    . KYPHOPLASTY N/A 03/17/2019   Procedure: KYPHOPLASTY L2;  Surgeon: Hessie Knows, MD;  Location: [facility] ORS;  Service: Orthopedics;  Laterality: N/A;  . THYROIDECTOMY       reports that she has never smoked. She has never used smokeless tobacco. She reports that she does not drink alcohol or use drugs.  Allergies  Allergen Reactions  . Amlodipine Swelling  . Clonidine Derivatives Swelling  . Norvasc [Amlodipine Besylate] Swelling  . Aspirin Other (See Comments)    Blood pressure goes up  . Celebrex [Celecoxib] Other (See Comments)    Blood pressure goes up  . Codeine Swelling  . Propoxyphene Swelling  . Latex Other (See Comments)    NEGATIVE RASH TEST 09/06/15  . Penicillins Other (See Comments)  Patient does not want medication Did it involve swelling of the face/tongue/throat, SOB, or low BP? Unknown Did it involve sudden or severe rash/hives, skin peeling, or any reaction on the inside of your mouth or nose? Unknown Did you need to seek medical attention at a hospital or doctor's office? Unknown When did it last happen?childhood If all above answers are "NO", may proceed with cephalosporin use.  . Ace Inhibitors Cough  . Ciprofloxacin Palpitations  . Flexeril [Cyclobenzaprine] Other (See Comments)    Blood pressure goes up  . Hctz  [Hydrochlorothiazide] Other (See Comments)    Unknown  . Lisinopril Other (See Comments)    Susceptible to sun  . Meperidine And Related Other (See Comments)    Unknown  . Pravastatin Rash  . Sulfa Antibiotics Rash    Family History  Problem Relation Age of Onset  . Breast cancer Neg Hx      Prior to Admission medications   Medication Sig Start Date End Date Taking? Authorizing Provider  cholecalciferol (VITAMIN D3) 25 MCG (1000 UT) tablet Take 2,000 Units by mouth daily.    Yes [provider]  fexofenadine (ALLEGRA) 180 MG tablet Take 180 mg by mouth at bedtime.    Yes [provider]  fluticasone (FLONASE) 50 MCG/ACT nasal spray Place 2 sprays into both nostrils daily as needed for rhinitis.    Yes [provider]  Glucosamine 500 MG CAPS Take 500 mg by mouth daily.    Yes [provider]  levothyroxine (SYNTHROID, LEVOTHROID) 75 MCG tablet Take 75 mcg by mouth daily.   Yes [provider]  metoprolol succinate (TOPROL-XL) 100 MG 24 hr tablet Take 100 mg by mouth 2 (two) times daily. 06/14/19  Yes [provider]  Multiple Vitamins-Minerals (PRESERVISION AREDS 2+MULTI VIT PO) Take 2 tablets by mouth daily.   Yes [provider]  potassium chloride SA (KLOR-CON) 20 MEQ tablet Take 20 mEq by mouth daily. 06/14/19  Yes [provider]  torsemide (DEMADEX) 20 MG tablet Take 40 mg by mouth daily. 04/19/19  Yes [provider]  acetaminophen (TYLENOL) 325 MG tablet Take 325-650 mg by mouth every 6 (six) hours as needed for mild pain or moderate pain.     [provider]    Physical Exam: Vitals:   06/15/19 1044 06/15/19 1045 06/15/19 1046  BP: 103/79    Pulse: (!) 117    Resp: 20    Temp:   (!) 97.5 F (36.4 C)  TempSrc:   Oral  SpO2: 97%    Weight:  65.3 kg   Height:  5\' 1"  (1.549 m)      Vitals:   06/15/19 1044 06/15/19 1045 06/15/19 1046  BP: 103/79    Pulse: (!) 117    Resp: 20      Temp:   (!) 97.5 F (36.4 C)  TempSrc:   Oral  SpO2: 97%    Weight:  65.3 kg   Height:  5\' 1"  (1.549 m)     Constitutional: NAD, alert and oriented x 3. Chronically ill appering Eyes: PERRL, lids and conjunctivae pallor ENMT: Mucous membranes are moist.  Neck: normal, supple, no masses, no thyromegaly Respiratory: Rales at the bases, no wheezing, no crackles. Normal respiratory effort. No accessory muscle use.  Cardiovascular: Irregularly irregular, no murmurs / rubs / gallops. 2-3 + pitting edema. 2+ pedal pulses. No carotid bruits.  Abdomen: no tenderness, no masses palpated. No hepatosplenomegaly. Bowel sounds positive. Increased abdominal girth Musculoskeletal:  no clubbing / cyanosis. No joint deformity upper and lower extremities.  Skin: no rashes, lesions, ulcers.  Neurologic: No gross focal neurologic deficit. Psychiatric: Normal mood and affect.   Labs on Admission: I have personally reviewed following labs and imaging studies  CBC: Recent Labs  Lab 06/15/19 1055  WBC 7.3  HGB 11.4*  HCT 32.9*  MCV 102.2*  PLT [id number]   Basic Metabolic Panel: Recent Labs  Lab 06/15/19 1055  NA 134*  K 3.7  CL 94*  CO2 27  GLUCOSE 102*  BUN 35*  CREATININE 2.38*  CALCIUM 9.4   GFR: Estimated Creatinine Clearance: 14.7 mL/min (A) (by C-G formula based on SCr of 2.38 mg/dL (H)). Liver Function Tests: Recent Labs  Lab 06/15/19 1055  AST 54*  ALT 14  ALKPHOS 79  BILITOT 2.7*  PROT 6.2*  ALBUMIN 2.5*   No results for input(s): LIPASE, AMYLASE in the last 168 hours. No results for input(s): AMMONIA in the last 168 hours. Coagulation Profile: Recent Labs  Lab 06/15/19 1305  INR 1.2   Cardiac Enzymes: No results for input(s): CKTOTAL, CKMB, CKMBINDEX, TROPONINI in the last 168 hours. BNP (last 3 results) No results for input(s): PROBNP in the last 8760 hours. HbA1C: No results for input(s): HGBA1C in the last 72 hours. CBG: No results for input(s): GLUCAP in the  last 168 hours. Lipid Profile: No results for input(s): CHOL, HDL, LDLCALC, TRIG, CHOLHDL, LDLDIRECT in the last 72 hours. Thyroid Function Tests: No results for input(s): TSH, T4TOTAL, FREET4, T3FREE, THYROIDAB in the last 72 hours. Anemia Panel: No results for input(s): VITAMINB12, FOLATE, FERRITIN, TIBC, IRON, RETICCTPCT in the last 72 hours. Urine analysis:    Component Value Date/Time   COLORURINE YELLOW (A) 06/16/2017 1930   APPEARANCEUR CLOUDY (A) 06/16/2017 1930   LABSPEC 1.004 (L) 06/16/2017 1930   PHURINE 5.0 06/16/2017 1930   GLUCOSEU NEGATIVE 06/16/2017 1930   HGBUR NEGATIVE 06/16/2017 1930   BILIRUBINUR NEGATIVE 06/16/2017 1930   KETONESUR NEGATIVE 06/16/2017 1930   PROTEINUR NEGATIVE 06/16/2017 1930   NITRITE NEGATIVE 06/16/2017 1930   LEUKOCYTESUR LARGE (A) 06/16/2017 1930    Radiological Exams on Admission: DG Chest Portable 1 View  Result Date: 06/15/2019 CLINICAL DATA:  Shortness of breath. EXAM: PORTABLE CHEST 1 VIEW COMPARISON:  June 16, 2017. FINDINGS: Stable cardiomegaly. No pneumothorax or significant pleural effusion is noted. Stable mild interstitial densities are noted throughout both lungs which may represent scarring, but acute superimposed edema or inflammation cannot be excluded. Bony thorax is unremarkable. IMPRESSION: Stable mild interstitial densities are noted throughout both lungs which may represent scarring, but acute superimposed edema or inflammation cannot be excluded. Electronically Signed   By: Marijo Conception M.D.   On: 06/15/2019 11:53    EKG: Independently reviewed.  Sinus tachycardia Left Bundle branch block  Assessment/Plan Principal Problem:   Acute on chronic systolic CHF (congestive heart failure) (HCC) Active Problems:   AKI (acute kidney injury) (HCC)   AF (paroxysmal atrial fibrillation) (HCC)   Hypothyroidism   CAD (coronary artery disease)      Acute on chronic systolic CHF Patient presents for evaluation of  shortness of breath with exertion and  admits to 2 pillow orthopnea and bilateral lower extremity swelling She admits to 10 pound weight gain Patient's last 2D Echocardiogram showed an LVEF of 20% Repeat 2D Echocardiogram Will place patient on Lasix 40 mg IV every 12 Maintain low sodium diet and fluid restriction Continue metoprolol Request cardiology consult Daily weights  Acute kidney injury ?? Cardio renal syndrome Patient has a baseline serum creatinine of 1.19 and on admission it was 2.38 Will request nephrology consult Continue diuresis   Hypothyroidism Stable Continue Synthroid   Paroxysmal atrial fibrillation Patient is not on long-term anticoagulation due to history of bleeding Continue metoprolol for rate control   History of coronary artery disease Stable Continue aspirin and beta-blockers    DVT prophylaxis: Heparin Code Status: Full Family Communication: Plan of care was discussed with patient and her daughter at the bedside. All questions and concerns have been addressed. Code status was discussed with patient and her daughter and she is a full code Disposition Plan: Back to previous home environment Consults called: Cardiology    Collier Bullock MD [facility]     06/15/2019, 2:39 PM

## 2019-06-15 NOTE — ED Provider Notes (Signed)
[facility] Emergency Department Provider Note  ____________________________________________   First MD Initiated Contact with Patient 06/15/19 1047     (approximate)  I have reviewed the triage vital signs and the nursing notes.   HISTORY  Chief Complaint Weakness    HPI Desiree Koch is a 86 y.o. female with extensive past medical history here with generalized weakness and leg swelling.   Patient arrives with her daughter who provides most of her history.  She was recently diagnosed with atrial fibrillation, worsening edema, as well as possible cirrhosis.  She has been having recurrent issues with weakness, leg swelling, and uncontrolled atrial fibrillation since then.  She follows up with Dr. Nehemiah Massed for cardiology.  She has been having her metoprolol increased.  Over the last several days, she has had worsening lower extremity edema, shortness of breath, weakness, and not leg swelling.  Her atrial fibrillation has been increasingly difficult to control, and her blood pressures have been normal to slightly low.  She has had approximately 10 pound weight gain over this time.  She has shortness of breath with even minimal exertion.  She also began peeing less than usual.  No known fevers or chills.  No known Covid exposures.       Past Medical History:  Diagnosis Date  . Arthritis   . Cancer ([place]) 2018   skin ca  . Cardiac arrhythmia   . Coronary artery disease   . CRD (chronic renal disease)   . Dysrhythmia   . Heart murmur   . HOH (hard of hearing)   . Hypercholesteremia   . Hypertension   . Hypothyroidism   . Leaky heart valve   . Liver damage   . Lower extremity edema   . Multiple allergies   . Myocardial infarction ([place])   . Osteoporosis   . Pneumonia     Patient Active Problem List   Diagnosis Date Noted  . Acute on chronic systolic CHF (congestive heart failure) ([place]) 06/15/2019  . AKI (acute kidney injury) ([place]) 06/15/2019  . AF  (paroxysmal atrial fibrillation) ([place]) 06/15/2019  . Hypothyroidism 06/15/2019  . CAD (coronary artery disease) 06/15/2019  . CHF (congestive heart failure), NYHA class I, acute on chronic, systolic ([place]) [id number]  . Bradycardia 06/16/2017    Past Surgical History:  Procedure Laterality Date  . ABDOMINAL HYSTERECTOMY    . CATARACT EXTRACTION W/PHACO Left 09/12/2015   Procedure: CATARACT EXTRACTION PHACO AND INTRAOCULAR LENS PLACEMENT (IOC);  Surgeon: Eulogio Bear, MD;  Location: [facility] ORS;  Service: Ophthalmology;  Laterality: Left;  Lot# [id number] H [place]: 01:02.0 AP%:15.3 CDE: 9.45  . CATARACT EXTRACTION W/PHACO Right 10/31/2015   Procedure: CATARACT EXTRACTION PHACO AND INTRAOCULAR LENS PLACEMENT (IOC);  Surgeon: Eulogio Bear, MD;  Location: [facility] ORS;  Service: Ophthalmology;  Laterality: Right;  [place] 01:16.2 AP% 14.8 CDE 11.27 Fluid Pack Lot # [id number]  . CESAREAN SECTION    . CHOLECYSTECTOMY    . KNEE ARTHROSCOPY    . KYPHOPLASTY N/A 03/17/2019   Procedure: KYPHOPLASTY L2;  Surgeon: Hessie Knows, MD;  Location: [facility] ORS;  Service: Orthopedics;  Laterality: N/A;  . THYROIDECTOMY      Prior to Admission medications   Medication Sig Start Date End Date Taking? Authorizing Provider  cholecalciferol (VITAMIN D3) 25 MCG (1000 UT) tablet Take 2,000 Units by mouth daily.    Yes [provider]  fexofenadine (ALLEGRA) 180 MG tablet Take 180 mg by mouth at bedtime.    Yes [provider]  fluticasone (FLONASE) 50 MCG/ACT nasal spray Place 2 sprays into both nostrils daily as needed for rhinitis.    Yes [provider]  Glucosamine 500 MG CAPS Take 500 mg by mouth daily.    Yes [provider]  levothyroxine (SYNTHROID, LEVOTHROID) 75 MCG tablet Take 75 mcg by mouth daily.   Yes [provider]  metoprolol succinate (TOPROL-XL) 100 MG 24 hr tablet Take 100 mg by mouth 2 (two) times daily. 06/14/19  Yes [provider]  Multiple  Vitamins-Minerals (PRESERVISION AREDS 2+MULTI VIT PO) Take 2 tablets by mouth daily.   Yes [provider]  potassium chloride SA (KLOR-CON) 20 MEQ tablet Take 20 mEq by mouth daily. 06/14/19  Yes [provider]  torsemide (DEMADEX) 20 MG tablet Take 40 mg by mouth daily. 04/19/19  Yes [provider]  acetaminophen (TYLENOL) 325 MG tablet Take 325-650 mg by mouth every 6 (six) hours as needed for mild pain or moderate pain.     [provider]    Allergies Amlodipine, Clonidine derivatives, Norvasc [amlodipine besylate], Aspirin, Celebrex [celecoxib], Codeine, Propoxyphene, Latex, Penicillins, Ace inhibitors, Ciprofloxacin, Flexeril [cyclobenzaprine], Hctz [hydrochlorothiazide], Lisinopril, Meperidine and related, Pravastatin, and Sulfa antibiotics  Family History  Problem Relation Age of Onset  . Breast cancer Neg Hx     Social History Social History   Tobacco Use  . Smoking status: Never Smoker  . Smokeless tobacco: Never Used  Substance Use Topics  . Alcohol use: No  . Drug use: No    Review of Systems  Review of Systems  Constitutional: Positive for fatigue. Negative for fever.  HENT: Negative for congestion and sore throat.   Eyes: Negative for visual disturbance.  Respiratory: Positive for cough. Negative for shortness of breath.   Cardiovascular: Positive for leg swelling. Negative for chest pain.  Gastrointestinal: Positive for abdominal distention and nausea. Negative for abdominal pain, diarrhea and vomiting.  Genitourinary: Negative for flank pain.  Musculoskeletal: Negative for back pain and neck pain.  Skin: Negative for rash and wound.  Neurological: Positive for weakness.  All other systems reviewed and are negative.    ____________________________________________  PHYSICAL EXAM:      VITAL SIGNS: ED Triage Vitals  Enc Vitals Group     BP 06/15/19 1044 103/79     Pulse Rate 06/15/19 1044 (!) 117     Resp 06/15/19 1044  20     Temp 06/15/19 1046 (!) 97.5 F (36.4 C)     Temp Source 06/15/19 1046 Oral     SpO2 06/15/19 1044 97 %     Weight 06/15/19 1045 144 lb (65.3 kg)     Height 06/15/19 1045 5\' 1"  (1.549 m)     Head Circumference --      Peak Flow --      Pain Score 06/15/19 1045 0     Pain Loc --      Pain Edu? --      Excl. in [place]? --      Physical Exam Vitals and nursing note reviewed.  Constitutional:      General: She is not in acute distress.    Appearance: She is well-developed.  HENT:     Head: Normocephalic and atraumatic.  Eyes:     Conjunctiva/sclera: Conjunctivae normal.  Cardiovascular:     Rate and Rhythm: Tachycardia present. Rhythm irregular.     Heart sounds: Normal heart sounds. No murmur. No friction rub.  Pulmonary:  Effort: Pulmonary effort is normal. No respiratory distress.     Breath sounds: Normal breath sounds. No wheezing or rales ([place]).  Abdominal:     General: There is distension.     Palpations: Abdomen is soft.     Comments: Distended, fluid wave difficult to assess due to habitus  Musculoskeletal:     Cervical back: Neck supple.     Right lower leg: Edema present.     Left lower leg: Edema present.  Skin:    General: Skin is warm.     Capillary Refill: Capillary refill takes less than 2 seconds.  Neurological:     Mental Status: She is alert and oriented to person, place, and time.     Motor: No abnormal muscle tone.       ____________________________________________   LABS (all labs ordered are listed, but only abnormal results are displayed)  Labs Reviewed  BASIC METABOLIC PANEL - Abnormal; Notable for the following components:      Result Value   Sodium 134 (*)    Chloride 94 (*)    Glucose, Bld 102 (*)    BUN 35 (*)    Creatinine, Ser 2.38 (*)    GFR calc non Af Amer 18 (*)    GFR calc Af Amer 21 (*)    All other components within normal limits  CBC - Abnormal; Notable for the following components:   RBC 3.22 (*)     Hemoglobin 11.4 (*)    HCT 32.9 (*)    MCV 102.2 (*)    MCH 35.4 (*)    RDW 15.9 (*)    All other components within normal limits  BRAIN NATRIURETIC PEPTIDE - Abnormal; Notable for the following components:   B Natriuretic Peptide 924.0 (*)    All other components within normal limits  HEPATIC FUNCTION PANEL - Abnormal; Notable for the following components:   Total Protein 6.2 (*)    Albumin 2.5 (*)    AST 54 (*)    Total Bilirubin 2.7 (*)    Bilirubin, Direct 0.7 (*)    Indirect Bilirubin 2.0 (*)    All other components within normal limits  TROPONIN I (HIGH SENSITIVITY) - Abnormal; Notable for the following components:   Troponin I (High Sensitivity) 44 (*)    All other components within normal limits  TROPONIN I (HIGH SENSITIVITY) - Abnormal; Notable for the following components:   Troponin I (High Sensitivity) 44 (*)    All other components within normal limits  RESPIRATORY PANEL BY RT PCR (FLU A&B, COVID)  PROTIME-INR  TSH  BASIC METABOLIC PANEL    ____________________________________________  EKG: Suspect atrial fibrillation with ventricular rate 117, this time P waves are noted.  QRS wide at 151 with left bundle branch block.  No scar Boso criteria or evidence of acute ischemia ________________________________________  RADIOLOGY All imaging, including plain films, CT scans, and ultrasounds, independently reviewed by me, and interpretations confirmed via formal radiology reads.  ED MD interpretation:   Chest x-ray: Interstitial densities likely due to edema  Official radiology report(s): DG Chest Portable 1 View  Result Date: 06/15/2019 CLINICAL DATA:  Shortness of breath. EXAM: PORTABLE CHEST 1 VIEW COMPARISON:  June 16, 2017. FINDINGS: Stable cardiomegaly. No pneumothorax or significant pleural effusion is noted. Stable mild interstitial densities are noted throughout both lungs which may represent scarring, but acute superimposed edema or inflammation cannot be  excluded. Bony thorax is unremarkable. IMPRESSION: Stable mild interstitial densities are noted throughout both lungs  which may represent scarring, but acute superimposed edema or inflammation cannot be excluded. Electronically Signed   By: Marijo Conception M.D.   On: 06/15/2019 11:53   [place] ASCITES (ABDOMEN LIMITED)  Result Date: 06/15/2019 CLINICAL DATA:  Abdominal distension for 1 month EXAM: LIMITED ABDOMEN ULTRASOUND FOR ASCITES TECHNIQUE: Limited ultrasound survey for ascites was performed in all four abdominal quadrants. COMPARISON:  07/29/2018 FINDINGS: Scattered ascites identified in all 4 quadrants, greater in lower quadrants. RIGHT pleural effusion also identified. IMPRESSION: Ascites throughout abdomen as well as RIGHT pleural effusion. Electronically Signed   By: Lavonia Dana M.D.   On: 06/15/2019 15:19    ____________________________________________  PROCEDURES   Procedure(s) performed (including Critical Care):  .1-3 Lead EKG Interpretation Performed by: Duffy Bruce, MD Authorized by: Duffy Bruce, MD     Interpretation: abnormal     ECG rate:  100-120s   ECG rate assessment: tachycardic     Rhythm comment:  Intermittent atrial fibrillation with occasional sinus tachycardia   Ectopy: PVCs     Conduction: normal   Comments:     Indication: CHF, AFib, here with SOB and weakness.    ____________________________________________  INITIAL IMPRESSION / MDM / ASSESSMENT AND PLAN / ED COURSE  As part of my medical decision making, I reviewed the following data within the [place] notes reviewed and incorporated, Old chart reviewed, Notes from prior ED visits, and [place] Controlled Substance Database       *KERSTYN FU was evaluated in Emergency Department on 06/15/2019 for the symptoms described in the history of present illness. She was evaluated in the context of the global COVID-19 pandemic, which necessitated consideration that the patient  might be at risk for infection with the SARS-CoV-2 virus that causes COVID-19. Institutional protocols and algorithms that pertain to the evaluation of patients at risk for COVID-19 are in a state of rapid change based on information released by regulatory bodies including the CDC and federal and state organizations. These policies and algorithms were followed during the patient's care in the ED.  Some ED evaluations and interventions may be delayed as a result of limited staffing during the pandemic.*     Medical Decision Making:  86 yo F here with anasarca, SOB, and significant weakness. On exam, pt is hypervolemic with pitting edema b/l, bilateral rales, and abdominal distension. Lab work shows likely AoCKD as well as elevated BNP. EKG shows AFib and monitor shows occasional RVR. Suspect hypervolemia triggering intermittent AFib RVR, likely 2/2 combination of underlying CHF as well as possible liver disease and CKD. Will start with diuresis given >10 lb weight gain and grossly fluid overloaded status, plan to admit. Telemetry shows stable HR in 100-110s.  ____________________________________________  FINAL CLINICAL IMPRESSION(S) / ED DIAGNOSES  Final diagnoses:  Acute on chronic congestive heart failure, unspecified heart failure type ([place])  Anasarca     MEDICATIONS GIVEN DURING THIS VISIT:  Medications  acetaminophen (TYLENOL) tablet 325-650 mg (has no administration in time range)  metoprolol succinate (TOPROL-XL) 24 hr tablet 100 mg (100 mg Oral Given 06/15/19 1641)  levothyroxine (SYNTHROID) tablet 75 mcg (has no administration in time range)  cholecalciferol (VITAMIN D3) tablet 2,000 Units (2,000 Units Oral Given 06/15/19 1643)  multivitamin-lutein (OCUVITE-LUTEIN) capsule 1 capsule (has no administration in time range)  potassium chloride SA (KLOR-CON) CR tablet 20 mEq (20 mEq Oral Given 06/15/19 1643)  loratadine (CLARITIN) tablet 10 mg (10 mg Oral Given 06/15/19 1643)  fluticasone  (FLONASE) 50  MCG/ACT nasal spray 2 spray (has no administration in time range)  sodium chloride flush (NS) 0.9 % injection 3 mL (3 mLs Intravenous Refused 06/15/19 1628)  sodium chloride flush (NS) 0.9 % injection 3 mL (has no administration in time range)  0.9 %  sodium chloride infusion (has no administration in time range)  acetaminophen (TYLENOL) tablet 650 mg (has no administration in time range)  ondansetron (ZOFRAN) injection 4 mg (has no administration in time range)  heparin injection 5,000 Units (has no administration in time range)  furosemide (LASIX) injection 40 mg (40 mg Intravenous Not Given 06/15/19 1630)  furosemide (LASIX) injection 40 mg (40 mg Intravenous Given 06/15/19 1422)     ED Discharge Orders    None       Note:  This document was prepared using Dragon voice recognition software and may include unintentional dictation errors.   Duffy Bruce, MD 06/15/19 1756

## 2019-06-15 NOTE — ED Notes (Signed)
Report given to LIsa RN, pt admitted to 247 via transporter.

## 2019-06-16 ENCOUNTER — Inpatient Hospital Stay
Admit: 2019-06-16 | Discharge: 2019-06-16 | Disposition: A | Discharge status: Home / Self Care | Payer: Medicare HMO | Attending: Internal Medicine | Admitting: Internal Medicine

## 2019-06-16 ENCOUNTER — Inpatient Hospital Stay: Payer: Medicare HMO

## 2019-06-16 LAB — BASIC METABOLIC PANEL
Anion gap: 11 (ref 5–15)
BUN: 38 mg/dL — ABNORMAL HIGH (ref 8–23)
CO2: 29 mmol/L (ref 22–32)
Calcium: 8.9 mg/dL (ref 8.9–10.3)
Chloride: 95 mmol/L — ABNORMAL LOW (ref 98–111)
Creatinine, Ser: 2.19 mg/dL — ABNORMAL HIGH (ref 0.44–1.00)
GFR calc Af Amer: 23 mL/min — ABNORMAL LOW (ref >60)
GFR calc non Af Amer: 20 mL/min — ABNORMAL LOW (ref >60)
Glucose, Bld: 98 mg/dL (ref 70–99)
Potassium: 4.1 mmol/L (ref 3.5–5.1)
Sodium: 135 mmol/L (ref 135–145)

## 2019-06-16 LAB — PROTEIN, PLEURAL OR PERITONEAL FLUID: Total protein, fluid: <3 g/dL

## 2019-06-16 LAB — PROTEIN / CREATININE RATIO, URINE
Creatinine, Urine: 150 mg/dL
Protein Creatinine Ratio: 0.06 mg/mg{Cre} (ref 0.00–0.15)
Total Protein, Urine: 9 mg/dL

## 2019-06-16 LAB — PATHOLOGIST SMEAR REVIEW

## 2019-06-16 LAB — BODY FLUID CELL COUNT WITH DIFFERENTIAL
Eos, Fluid: 0 %
Lymphs, Fluid: 76 %
Monocyte-Macrophage-Serous Fluid: 21 %
Neutrophil Count, Fluid: 3 %
Other Cells, Fluid: 0 %
Total Nucleated Cell Count, Fluid: 267 cu mm

## 2019-06-16 LAB — ECHOCARDIOGRAM COMPLETE
Height: 61 in
Weight: 2448 oz

## 2019-06-16 LAB — IRON AND TIBC
Iron: 61 ug/dL (ref 28–170)
Saturation Ratios: 31 % (ref 10.4–31.8)
TIBC: 200 ug/dL — ABNORMAL LOW (ref 250–450)
UIBC: 139 ug/dL

## 2019-06-16 LAB — FOLATE: Folate: 22 ng/mL (ref >5.9)

## 2019-06-16 LAB — FERRITIN: Ferritin: 238 ng/mL (ref 11–307)

## 2019-06-16 MED ORDER — FUROSEMIDE 10 MG/ML IJ SOLN
40.0000 mg | Freq: Two times a day (BID) | INTRAMUSCULAR | Status: DC
  Administered 2019-06-16 – 2019-06-17 (×4): 40 mg via INTRAVENOUS
  Filled 2019-06-16 (×4): qty 4

## 2019-06-16 MED ORDER — DIGOXIN 125 MCG PO TABS
0.0625 mg | ORAL_TABLET | Freq: Every day | ORAL | Status: DC
  Administered 2019-06-16 – 2019-06-20 (×5): 0.0625 mg via ORAL
  Filled 2019-06-16 (×5): qty 0.5

## 2019-06-16 MED ORDER — ALBUMIN HUMAN 25 % IV SOLN
25.0000 g | Freq: Two times a day (BID) | INTRAVENOUS | Status: DC
  Administered 2019-06-16 – 2019-06-18 (×5): 25 g via INTRAVENOUS
  Filled 2019-06-16 (×5): qty 100

## 2019-06-16 NOTE — Progress Notes (Signed)
[facility] Card[facility]    SUBJECTIVE: Patient states to be doing better she has less shortness of breath legs leg swelling no chest pain has not been ambulatory but denies any direct complaints except for trouble hearing and needs a new hearing aid.  Patient states he slept reasonably well still has some generalized weakness and fatigue.  Denies palpitations chest pain angina or vertigo   Vitals:   06/15/19 2016 06/16/19 0240 06/16/19 0343 06/16/19 0805  BP: 111/81  (!) 97/59 108/70  Pulse: (!) 118  (!) 117 (!) 115  Resp: 19  20 18   Temp: 97.6 F (36.4 C)  98.6 F (37 C) 98.6 F (37 C)  TempSrc: Oral  Oral   SpO2: 100%  96% 98%  Weight: 69.5 kg 69.4 kg    Height: 5\' 1"  (1.549 m)        Intake/Output Summary (Last 24 hours) at 06/16/2019 0843 Last data filed at 06/16/2019 0400 Gross per 24 hour  Intake --  Output 900 ml  Net -900 ml      PHYSICAL EXAM  General: Well developed, well nourished, in no acute distress HEENT:  Normocephalic and atramatic Neck:  No JVD.  Lungs: Clear bilaterally to auscultation and percussion. Heart: HRRR . Normal S1 and S2 without gallops or murmurs.  Abdomen: Bowel sounds are positive, abdomen soft and non-tender  Msk:  Back normal, normal gait. Normal strength and tone for age. Extremities: No clubbing, cyanosis or edema.   Neuro: Alert and oriented X 3. Psych:  Good affect, responds appropriately   LABS: Basic Metabolic Panel: Recent Labs    06/15/19 1055 06/16/19 0553  NA 134* 135  K 3.7 4.1  CL 94* 95*  CO2 27 29  GLUCOSE 102* 98  BUN 35* 38*  CREATININE 2.38* 2.19*  CALCIUM 9.4 8.9   Liver Function Tests: Recent Labs    06/15/19 1055  AST 54*  ALT 14  ALKPHOS 79  BILITOT 2.7*  PROT 6.2*  ALBUMIN 2.5*   No results for input(s): LIPASE, AMYLASE in the last 72 hours. CBC: Recent Labs    06/15/19 1055  WBC 7.3  HGB 11.4*  HCT 32.9*  MCV 102.2*  PLT 159   Cardiac Enzymes: No results for input(s): CKTOTAL, CKMB,  CKMBINDEX, TROPONINI in the last 72 hours. BNP: Invalid input(s): POCBNP D-Dimer: No results for input(s): DDIMER in the last 72 hours. Hemoglobin A1C: No results for input(s): HGBA1C in the last 72 hours. Fasting Lipid Panel: No results for input(s): CHOL, HDL, LDLCALC, TRIG, CHOLHDL, LDLDIRECT in the last 72 hours. Thyroid Function Tests: Recent Labs    06/15/19 1305  TSH 2.979   Anemia Panel: No results for input(s): VITAMINB12, FOLATE, FERRITIN, TIBC, IRON, RETICCTPCT in the last 72 hours.  DG Chest Portable 1 View  Result Date: 06/15/2019 CLINICAL DATA:  Shortness of breath. EXAM: PORTABLE CHEST 1 VIEW COMPARISON:  June 16, 2017. FINDINGS: Stable cardiomegaly. No pneumothorax or significant pleural effusion is noted. Stable mild interstitial densities are noted throughout both lungs which may represent scarring, but acute superimposed edema or inflammation cannot be excluded. Bony thorax is unremarkable. IMPRESSION: Stable mild interstitial densities are noted throughout both lungs which may represent scarring, but acute superimposed edema or inflammation cannot be excluded. Electronically Signed   By: Marijo Conception M.D.   On: 06/15/2019 11:53   [place] ASCITES (ABDOMEN LIMITED)  Result Date: 06/15/2019 CLINICAL DATA:  Abdominal distension for 1 month EXAM: LIMITED ABDOMEN ULTRASOUND FOR ASCITES TECHNIQUE: Limited  ultrasound survey for ascites was performed in all four abdominal quadrants. COMPARISON:  07/29/2018 FINDINGS: Scattered ascites identified in all 4 quadrants, greater in lower quadrants. RIGHT pleural effusion also identified. IMPRESSION: Ascites throughout abdomen as well as RIGHT pleural effusion. Electronically Signed   By: Lavonia Dana M.D.   On: 06/15/2019 15:19     Echo pending  TELEMETRY: Atrial fibrillation rapid ventricular response left bundle branch block:  ASSESSMENT AND PLAN:  Principal Problem:   Acute on chronic systolic CHF (congestive heart failure)  (H[facility]) Active Problems:   AKI (acute kidney injury) ([place])   AF (paroxysmal atrial fibrillation) ([facility])   Hypothyroidism   CAD (coronary artery disease)   CHF (congestive heart failure), NYHA class I, acute on chronic, systolic ([facility]) Angina known coronary artery disease Peripheral edema Generalized weakness Hypertension Left bundle branch block . Plan Patient seems to be doing reasonably well Continue telemetry rhythm appears to be A. fib and rapid ventricular response Continue beta-blockade therapy to help with rate management Consider adding digoxin to help with rate control with what appears to be chronic atrial fib flutter Agree with intravenous diuretic therapy for heart failure Consider imdur hydralazine for heart failure treatment Poor candidate for ACE ARB or Entresto because of allergy Recommend increase activity ambulation in the halls with assistance Recommend nephrology input to help with renal insufficiency issues Continue conservative cardiac input     Yolonda Kida, MD 06/16/2019 8:43 AM

## 2019-06-16 NOTE — Procedures (Signed)
Interventional Radiology Procedure:   Indications: Acute renal failure with ascites  Procedure: US guided paracentesis  Findings: Removed 3000 ml from left lower quadrant  Complications: None     EBL: Less than 10 ml   Desiree Koch R. Anselm Pancoast, MD  Pager: [phone]

## 2019-06-16 NOTE — Plan of Care (Signed)

## 2019-06-16 NOTE — Progress Notes (Signed)
PROGRESS NOTE    Desiree Koch  [id number] DOB: [birth year] DOA: 06/15/2019 PCP: Adin Hector, MD    Assessment & Plan:   Principal Problem:   Acute on chronic systolic CHF (congestive heart failure) ([place]) Active Problems:   AKI (acute kidney injury) ([place])   AF (paroxysmal atrial fibrillation) ([facility])   Hypothyroidism   CAD (coronary artery disease)   CHF (congestive heart failure), NYHA class I, acute on chronic, systolic ([facility])    Desiree Koch is a 86 y.o. female with medical history significant for atrial fibrillation not on anticoagulation due to history of bleeding, chronic systolic heart failure with last known LVEF of 20%, hypothyroidism, hypertension, liver cirrhosis and coronary artery disease.  Patient was brought to the emergency room by her daughter for evaluation of worsening lower extremity swelling and increasing abdominal girth.   Acute on chronic systolic CHF, LVEF [id number] --shortness of breath with exertion, 2 pillow orthopnea and bilateral lower extremity swelling, 10 pound weight gain.  Home regimen of torsemide 40mg  daily, reportedly not producing much urine with this. Patient's last 2D Echocardiogram showed an LVEF of 20% PLAN: --Repeat 2D Echocardiogram --Lasix 40 mg IV every 12, per nephrology --Maintain low sodium diet and fluid restriction --Strict I/O and Daily weights --Continue metoprolol --cardiology consult  Paroxysmal atrial fibrillation Patient is not on long-term anticoagulation due to history of bleeding --Continue metoprolol for rate control --start digoxin, per cards  Acute renal failure on chronic kidney disease stage IIIB --Cr 2.38 on presentation.  baseline creatinine of 1.4, GFR of 36 on 04/13/2019. PLAN: --Nephrology consult --renal US - CKD work up: SPEP/UPEP, hepatitis panel, urine studies, per nephrology - Not on an ARB. Has history of ACE-I due to cough.   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  Never had paracentesis before.  No abdominal pain, fever, or leukocytosis to suggest SBP. PLAN: --Paracentesis today with 3L removed (a lot more left, but want to avoid causing hepatorenal syndrome).   --IV albumin --continue IV lasix, per nephrology --consider adding aldactone  --Will likely need another paracentesis during this admission  Anemia with chronic kidney disease: macrocytic - Check iron studies and anemia labs.   Hypothyroidism Continue Synthroid  History of coronary artery disease Stable Continue aspirin and beta-blockers   DVT prophylaxis: Heparin SQ Code Status: Full code  Family Communication: updated daughter and son at the bedside today Disposition Plan: from home, like back home with [facility], unclear when, still on IV diuresis and need further gentle paracentesis.   Subjective and Interval History:  Pt reported breathing a little better.  No fever, chest pain, abdominal pain, N/V/D, dysuria.  Paracentesis with 3L out today.  Albumin given.   Objective: Vitals:   06/16/19 1143 06/16/19 1318 06/16/19 1425 06/16/19 1605  BP: (!) 85/58 104/70 112/66 (!) 96/56  Pulse: (!) 119 (!) 118 (!) 118 (!) 111  Resp: 16   16  Temp: 97.6 F (36.4 C)   98.2 F (36.8 C)  TempSrc: Oral   Oral  SpO2: 99% 97% 98% 98%  Weight:      Height:        Intake/Output Summary (Last 24 hours) at 06/16/2019 1921 Last data filed at 06/16/2019 1830 Gross per 24 hour  Intake 480 ml  Output 800 ml  Net -320 ml   Filed Weights   06/15/19 1045 06/15/19 2016 06/16/19 0240  Weight: 65.3 kg 69.5 kg 69.4 kg    Examination:   Constitutional: NAD,  AAOx3 HEENT: conjunctivae and lids normal, EOMI CV: RRR no M,R,G. Distal pulses +2.  No cyanosis.   RESP: CTA B/L, normal respiratory effort  GI: +BS, NT, large abdomen with fluid wave Extremities: 2+ pitting edema in BLE up to knees SKIN: warm, dry and intact Neuro: II - XII grossly intact.  Sensation intact Psych: Normal  mood and affect.  Appropriate judgement and reason   Data Reviewed: I have personally reviewed following labs and imaging studies  CBC: Recent Labs  Lab 06/15/19 1055  WBC 7.3  HGB 11.4*  HCT 32.9*  MCV 102.2*  PLT [id number]   Basic Metabolic Panel: Recent Labs  Lab 06/15/19 1055 06/16/19 0553  NA 134* 135  K 3.7 4.1  CL 94* 95*  CO2 27 29  GLUCOSE 102* 98  BUN 35* 38*  CREATININE 2.38* 2.19*  CALCIUM 9.4 8.9   GFR: Estimated Creatinine Clearance: 16.4 mL/min (A) (by C-G formula based on SCr of 2.19 mg/dL (H)). Liver Function Tests: Recent Labs  Lab 06/15/19 1055  AST 54*  ALT 14  ALKPHOS 79  BILITOT 2.7*  PROT 6.2*  ALBUMIN 2.5*   No results for input(s): LIPASE, AMYLASE in the last 168 hours. No results for input(s): AMMONIA in the last 168 hours. Coagulation Profile: Recent Labs  Lab 06/15/19 1305  INR 1.2   Cardiac Enzymes: No results for input(s): CKTOTAL, CKMB, CKMBINDEX, TROPONINI in the last 168 hours. BNP (last 3 results) No results for input(s): PROBNP in the last 8760 hours. HbA1C: No results for input(s): HGBA1C in the last 72 hours. CBG: No results for input(s): GLUCAP in the last 168 hours. Lipid Profile: No results for input(s): CHOL, HDL, LDLCALC, TRIG, CHOLHDL, LDLDIRECT in the last 72 hours. Thyroid Function Tests: Recent Labs    06/15/19 1305  TSH 2.979   Anemia Panel: Recent Labs    06/16/19 1711  FOLATE 22.0  FERRITIN 238  TIBC 200*  IRON 61   Sepsis Labs: No results for input(s): PROCALCITON, LATICACIDVEN in the last 168 hours.  Recent Results (from the past 240 hour(s))  Respiratory Panel by RT PCR (Flu A&B, Covid) - Nasopharyngeal Swab     Status: None   Collection Time: 06/15/19  1:05 PM   Specimen: Nasopharyngeal Swab  Result Value Ref Range Status   SARS Coronavirus 2 by RT PCR NEGATIVE NEGATIVE Final    Comment: (NOTE) SARS-CoV-2 target nucleic acids are NOT DETECTED. The SARS-CoV-2 RNA is generally detectable  in upper respiratoy specimens during the acute phase of infection. The lowest concentration of SARS-CoV-2 viral copies this assay can detect is 131 copies/mL. A negative result does not preclude SARS-Cov-2 infection and should not be used as the sole basis for treatment or other patient management decisions. A negative result may occur with  improper specimen collection/handling, submission of specimen other than nasopharyngeal swab, presence of viral mutation(s) within the areas targeted by this assay, and inadequate number of viral copies (<131 copies/mL). A negative result must be combined with clinical observations, patient history, and epidemiological information. The expected result is Negative. Fact Sheet for Patients:  PinkCheek.be Fact Sheet for Healthcare Providers:  GravelBags.it This test is not yet ap proved or cleared by the [place] FDA and  has been authorized for detection and/or diagnosis of SARS-CoV-2 by FDA under an Emergency Use Authorization (EUA). This EUA will remain  in effect (meaning this test can be used) for the duration of the COVID-19 declaration under Section 564(b)(1) of the Act,  21 U.S.C. section 360bbb-3(b)(1), unless the authorization is terminated or revoked sooner.    Influenza A by PCR NEGATIVE NEGATIVE Final   Influenza B by PCR NEGATIVE NEGATIVE Final    Comment: (NOTE) The Xpert Xpress SARS-CoV-2/FLU/RSV assay is intended as an aid in  the diagnosis of influenza from Nasopharyngeal swab specimens and  should not be used as a sole basis for treatment. Nasal washings and  aspirates are unacceptable for Xpert Xpress SARS-CoV-2/FLU/RSV  testing. Fact Sheet for Patients: PinkCheek.be Fact Sheet for Healthcare Providers: GravelBags.it This test is not yet approved or cleared by the [place] FDA and  has been authorized for  detection and/or diagnosis of SARS-CoV-2 by  FDA under an Emergency Use Authorization (EUA). This EUA will remain  in effect (meaning this test can be used) for the duration of the  Covid-19 declaration under Section 564(b)(1) of the Act, 21  U.S.C. section 360bbb-3(b)(1), unless the authorization is  terminated or revoked. Performed at [facility], [address], [place], [place] [address]       Radiology Studies: US RENAL  Result Date: 06/16/2019 CLINICAL DATA:  Acute kidney failure.  Chronic kidney disease. EXAM: RENAL / URINARY TRACT ULTRASOUND COMPLETE COMPARISON:  07/29/2018, ultrasound 02/22/2017 FINDINGS: Right Kidney: Renal measurements: 9.4 x 4.4 x 4.2 centimeters = volume: 90 mL. On previous ultrasound 02/22/2017, the RIGHT kidney measured 10.1 centimeters. Two small exophytic lesions are identified in the midpole region, measuring 0.9 and 1.2 centimeters. These lesions likely represent cysts given stability since 2018. Left Kidney: Renal measurements: 8.4 x 4.7 x 4.1 centimeters = volume: 86 mL. On previous ultrasound 02/22/2017, the LEFT kidney measured 10.0 centimeters. Echogenicity within normal limits. No mass or hydronephrosis visualized. Bladder: Decompressed Other: None. IMPRESSION: Kidneys are smaller compared to prior studies.  No hydronephrosis. Stable exophytic RIGHT renal cysts. Electronically Signed   By: Nolon Nations M.D.   On: 06/16/2019 14:45   US Paracentesis  Result Date: 06/16/2019 INDICATION: Acute renal failure with ascites. EXAM: ULTRASOUND GUIDED  PARACENTESIS MEDICATIONS: None. COMPLICATIONS: None immediate. PROCEDURE: Informed written consent was obtained from the patient after a discussion of the risks, benefits and alternatives to treatment. A timeout was performed prior to the initiation of the procedure. Initial ultrasound scanning demonstrates a large amount of ascites within the left lower abdominal quadrant. The left lower abdomen was  prepped and draped in the usual sterile fashion. 1% lidocaine was used for local anesthesia. Following this, a 6 Fr Safe-T-Centesis catheter was introduced. An ultrasound image was saved for documentation purposes. The paracentesis was performed. The catheter was removed and a dressing was applied. The patient tolerated the procedure well without immediate post procedural complication. FINDINGS: A total of approximately 3 L of pink colored fluid was removed. Samples were sent to the laboratory as requested by the clinical team. Max volume to be removed was 3 L and there was residual fluid after the paracentesis. IMPRESSION: Successful ultrasound-guided paracentesis yielding 3 liters of peritoneal fluid. Electronically Signed   By: Markus Daft M.D.   On: 06/16/2019 14:51   DG Chest Portable 1 View  Result Date: 06/15/2019 CLINICAL DATA:  Shortness of breath. EXAM: PORTABLE CHEST 1 VIEW COMPARISON:  June 16, 2017. FINDINGS: Stable cardiomegaly. No pneumothorax or significant pleural effusion is noted. Stable mild interstitial densities are noted throughout both lungs which may represent scarring, but acute superimposed edema or inflammation cannot be excluded. Bony thorax is unremarkable. IMPRESSION: Stable mild interstitial densities are noted throughout both  lungs which may represent scarring, but acute superimposed edema or inflammation cannot be excluded. Electronically Signed   By: Marijo Conception M.D.   On: 06/15/2019 11:53   ECHOCARDIOGRAM COMPLETE  Result Date: 06/16/2019    ECHOCARDIOGRAM REPORT   Patient Name:   Desiree Koch Date of Exam: 06/16/2019 Medical Rec #:  [id number]       Height:       61.0 in Accession #:    [id number]      Weight:       153.0 lb Date of Birth:  1931/02/05      BSA:          1.686 m Patient Age:    28 years        BP:           108/70 mmHg Patient Gender: F               HR:           99 bpm. Exam Location:  [facility] Procedure: 2D Echo, Color Doppler and Cardiac Doppler  Indications:     CHF- acute diastolic [id number]  History:         Patient has prior history of Echocardiogram examinations, most                  recent 06/17/2017. CAD; Risk Factors:Hypertension. Pneumonia.  Sonographer:     Sherrie Sport RDCS (AE) Referring Phys:  [id number] Collier Bullock Diagnosing Phys: Yolonda Kida MD IMPRESSIONS  1. Left ventricular ejection fraction, by estimation, is 20 to 25%. The left ventricle has severely decreased function. The left ventricle demonstrates regional wall motion abnormalities (see scoring diagram/findings for description). The left ventricular internal cavity size was mildly to moderately dilated. Left ventricular diastolic parameters were normal.  2. Right ventricular systolic function is mildly reduced. The right ventricular size is mildly enlarged. There is mildly elevated pulmonary artery systolic pressure.  3. Left atrial size was moderately dilated.  4. Right atrial size was moderately dilated.  5. The mitral valve is grossly normal. Moderate mitral valve regurgitation.  6. Tricuspid valve regurgitation is moderate.  7. The aortic valve is grossly normal. Aortic valve regurgitation is not visualized. FINDINGS  Left Ventricle: Left ventricular ejection fraction, by estimation, is 20 to 25%. The left ventricle has severely decreased function. The left ventricle demonstrates regional wall motion abnormalities. The left ventricular internal cavity size was mildly  to moderately dilated. There is no left ventricular hypertrophy. Abnormal (paradoxical) septal motion, consistent with left bundle branch block. Left ventricular diastolic parameters were normal. Right Ventricle: The right ventricular size is mildly enlarged. No increase in right ventricular wall thickness. Right ventricular systolic function is mildly reduced. There is mildly elevated pulmonary artery systolic pressure. The tricuspid regurgitant  velocity is 2.61 m/s, and with an assumed right atrial pressure of  10 mmHg, the estimated right ventricular systolic pressure is 0000000 mmHg. Left Atrium: Left atrial size was moderately dilated. Right Atrium: Right atrial size was moderately dilated. Pericardium: There is no evidence of pericardial effusion. Mitral Valve: The mitral valve is grossly normal. Moderate mitral valve regurgitation. Tricuspid Valve: The tricuspid valve is grossly normal. Tricuspid valve regurgitation is moderate. Aortic Valve: The aortic valve is grossly normal. Aortic valve regurgitation is not visualized. Aortic valve mean gradient measures 2.2 mmHg. Aortic valve peak gradient measures 3.6 mmHg. Aortic valve area, by VTI measures 1.92 cm. Pulmonic Valve: The pulmonic valve was normal in structure. Pulmonic valve  regurgitation is not visualized. Aorta: The aortic root is normal in size and structure. IAS/Shunts: No atrial level shunt detected by color flow Doppler.  LEFT VENTRICLE PLAX 2D LVIDd:         3.62 cm LVIDs:         3.33 cm LV PW:         1.16 cm LV IVS:        1.33 cm LVOT diam:     2.00 cm LV SV:         28 LV SV Index:   17 LVOT Area:     3.14 cm  LV Volumes (MOD) LV vol d, MOD A2C: 71.3 ml LV vol d, MOD A4C: 74.8 ml LV vol s, MOD A2C: 47.9 ml LV vol s, MOD A4C: 58.6 ml LV SV MOD A2C:     23.4 ml LV SV MOD A4C:     74.8 ml LV SV MOD BP:      20.8 ml RIGHT VENTRICLE RV S prime:     12.60 cm/s TAPSE (M-mode): 2.7 cm LEFT ATRIUM             Index       RIGHT ATRIUM           Index LA diam:        4.90 cm 2.91 cm/m  RA Area:     19.20 cm LA Vol (A2C):   72.8 ml 43.19 ml/m RA Volume:   51.30 ml  30.44 ml/m LA Vol (A4C):   81.9 ml 48.59 ml/m LA Biplane Vol: 83.1 ml 49.30 ml/m  AORTIC VALVE                   PULMONIC VALVE AV Area (Vmax):    1.81 cm    PV Vmax:        0.49 m/s AV Area (Vmean):   1.65 cm    PV Peak grad:   1.0 mmHg AV Area (VTI):     1.92 cm    RVOT Peak grad: 2 mmHg AV Vmax:           95.05 cm/s AV Vmean:          69.900 cm/s AV VTI:            0.147 m AV Peak Grad:       3.6 mmHg AV Mean Grad:      2.2 mmHg LVOT Vmax:         54.70 cm/s LVOT Vmean:        36.800 cm/s LVOT VTI:          0.090 m LVOT/AV VTI ratio: 0.61  AORTA Ao Root diam: 2.50 cm MITRAL VALVE               TRICUSPID VALVE MV Area (PHT): 5.13 cm    TR Peak grad:   27.2 mmHg MV Decel Time: 148 msec    TR Vmax:        261.00 cm/s MV E velocity: 92.20 cm/s                            SHUNTS                            Systemic VTI:  0.09 m  Systemic Diam: 2.00 cm Yolonda Kida MD Electronically signed by Yolonda Kida MD Signature Date/Time: 06/16/2019/1:26:31 PM    Final    [place] ASCITES (ABDOMEN LIMITED)  Result Date: 06/15/2019 CLINICAL DATA:  Abdominal distension for 1 month EXAM: LIMITED ABDOMEN ULTRASOUND FOR ASCITES TECHNIQUE: Limited ultrasound survey for ascites was performed in all four abdominal quadrants. COMPARISON:  07/29/2018 FINDINGS: Scattered ascites identified in all 4 quadrants, greater in lower quadrants. RIGHT pleural effusion also identified. IMPRESSION: Ascites throughout abdomen as well as RIGHT pleural effusion. Electronically Signed   By: Lavonia Dana M.D.   On: 06/15/2019 15:19     Scheduled Meds: . cholecalciferol  2,000 Units Oral Daily  . digoxin  0.0625 mg Oral Daily  . furosemide  40 mg Intravenous Q12H  . heparin  5,000 Units Subcutaneous Q8H  . levothyroxine  75 mcg Oral Daily  . loratadine  10 mg Oral Daily  . metoprolol succinate  100 mg Oral BID  . multivitamin-lutein  1 capsule Oral Daily  . potassium chloride SA  20 mEq Oral Daily  . sodium chloride flush  3 mL Intravenous Q12H   Continuous Infusions: . sodium chloride    . albumin human 25 g (06/16/19 1211)     LOS: 1 day     Enzo Bi, MD [facility] If 7PM-7AM, please contact night-coverage 06/16/2019, 7:21 PM

## 2019-06-16 NOTE — Consult Note (Signed)
[facility]  CONSULT NOTE    Date: 06/16/2019                  Patient Name:  Desiree Koch  MRN: [id number]  DOB: [birth year]  Age / Sex: 86 y.o., female         PCP: Adin Hector, MD                 Service Requesting Consult: Dr. Francine Graven                 Reason for Consult: Acute renal failure            History of Present Illness: Desiree Koch is admitted to acute exacerbation of systolic congestive heart failure. Daughter at bedside. Patient has been recently changed to torsemide as furosemide was not effective. However patient continued to gain weight, increasing edema and abdominal distention.  Patient has not been following a low salt diet or fluid restriction.    Medications: Outpatient medications: Medications Prior to Admission  Medication Sig Dispense Refill Last Dose  . cholecalciferol (VITAMIN D3) 25 MCG (1000 UT) tablet Take 2,000 Units by mouth daily.    06/14/2019 at Unknown time  . fexofenadine (ALLEGRA) 180 MG tablet Take 180 mg by mouth at bedtime.    06/14/2019 at Unknown time  . fluticasone (FLONASE) 50 MCG/ACT nasal spray Place 2 sprays into both nostrils daily as needed for rhinitis.    06/14/2019 at Unknown time  . Glucosamine 500 MG CAPS Take 500 mg by mouth daily.    06/14/2019 at Unknown time  . levothyroxine (SYNTHROID, LEVOTHROID) 75 MCG tablet Take 75 mcg by mouth daily.   06/14/2019 at Unknown time  . metoprolol succinate (TOPROL-XL) 100 MG 24 hr tablet Take 100 mg by mouth 2 (two) times daily.   06/14/2019 at Unknown time  . Multiple Vitamins-Minerals (PRESERVISION AREDS 2+MULTI VIT PO) Take 2 tablets by mouth daily.   06/14/2019 at Unknown time  . potassium chloride SA (KLOR-CON) 20 MEQ tablet Take 20 mEq by mouth daily.   06/14/2019 at Unknown time  . torsemide (DEMADEX) 20 MG tablet Take 40 mg by mouth daily.   06/14/2019 at Unknown time  . acetaminophen (TYLENOL) 325 MG tablet Take 325-650 mg by mouth every 6 (six)  hours as needed for mild pain or moderate pain.    PRN at PRN    Current medications: Current Facility-Administered Medications  Medication Dose Route Frequency Provider Last Rate Last Admin  . 0.9 %  sodium chloride infusion  250 mL Intravenous PRN Agbata, Tochukwu, MD      . acetaminophen (TYLENOL) tablet 325-650 mg  325-650 mg Oral Q6H PRN Agbata, Tochukwu, MD      . acetaminophen (TYLENOL) tablet 650 mg  650 mg Oral Q4H PRN Agbata, Tochukwu, MD      . cholecalciferol (VITAMIN D3) tablet 2,000 Units  2,000 Units Oral Daily Agbata, Tochukwu, MD   2,000 Units at 06/16/19 0851  . digoxin (LANOXIN) tablet 0.0625 mg  0.0625 mg Oral Daily Enzo Bi, MD      . fluticasone (FLONASE) 50 MCG/ACT nasal spray 2 spray  2 spray Each Nare Daily PRN Agbata, Tochukwu, MD      . heparin injection 5,000 Units  5,000 Units Subcutaneous Q8H Agbata, Tochukwu, MD   5,000 Units at 06/16/19 0738  . levothyroxine (SYNTHROID) tablet 75 mcg  75 mcg Oral Daily Agbata, Tochukwu, MD   75 mcg  at 06/16/19 0734  . loratadine (CLARITIN) tablet 10 mg  10 mg Oral Daily Agbata, Tochukwu, MD   10 mg at 06/16/19 0851  . metoprolol succinate (TOPROL-XL) 24 hr tablet 100 mg  100 mg Oral BID Enzo Bi, MD   100 mg at 06/16/19 0852  . multivitamin-lutein (OCUVITE-LUTEIN) capsule 1 capsule  1 capsule Oral Daily Agbata, Tochukwu, MD   1 capsule at 06/16/19 0851  . ondansetron (ZOFRAN) injection 4 mg  4 mg Intravenous Q6H PRN Agbata, Tochukwu, MD      . potassium chloride SA (KLOR-CON) CR tablet 20 mEq  20 mEq Oral Daily Agbata, Tochukwu, MD   20 mEq at 06/16/19 0851  . sodium chloride flush (NS) 0.9 % injection 3 mL  3 mL Intravenous Q12H Agbata, Tochukwu, MD   3 mL at 06/15/19 2107  . sodium chloride flush (NS) 0.9 % injection 3 mL  3 mL Intravenous PRN Agbata, Tochukwu, MD          Allergies: Allergies  Allergen Reactions  . Amlodipine Swelling  . Clonidine Derivatives Swelling  . Norvasc [Amlodipine Besylate] Swelling  .  Aspirin Other (See Comments)    Blood pressure goes up  . Celebrex [Celecoxib] Other (See Comments)    Blood pressure goes up  . Codeine Swelling  . Propoxyphene Swelling  . Latex Other (See Comments)    NEGATIVE RASH TEST 09/06/15  . Penicillins Other (See Comments)    Patient does not want medication Did it involve swelling of the face/tongue/throat, SOB, or low BP? Unknown Did it involve sudden or severe rash/hives, skin peeling, or any reaction on the inside of your mouth or nose? Unknown Did you need to seek medical attention at a hospital or doctor's office? Unknown When did it last happen?childhood If all above answers are "NO", may proceed with cephalosporin use.  . Ace Inhibitors Cough  . Ciprofloxacin Palpitations  . Flexeril [Cyclobenzaprine] Other (See Comments)    Blood pressure goes up  . Hctz [Hydrochlorothiazide] Other (See Comments)    Unknown  . Lisinopril Other (See Comments)    Susceptible to sun  . Meperidine And Related Other (See Comments)    Unknown  . Pravastatin Rash  . Sulfa Antibiotics Rash      Past Medical History: Past Medical History:  Diagnosis Date  . Arthritis   . Cancer ([place]) 2018   skin ca  . Cardiac arrhythmia   . Coronary artery disease   . CRD (chronic renal disease)   . Dysrhythmia   . Heart murmur   . HOH (hard of hearing)   . Hypercholesteremia   . Hypertension   . Hypothyroidism   . Leaky heart valve   . Liver damage   . Lower extremity edema   . Multiple allergies   . Myocardial infarction ([place])   . Osteoporosis   . Pneumonia      Past Surgical History: Past Surgical History:  Procedure Laterality Date  . ABDOMINAL HYSTERECTOMY    . CATARACT EXTRACTION W/PHACO Left 09/12/2015   Procedure: CATARACT EXTRACTION PHACO AND INTRAOCULAR LENS PLACEMENT (IOC);  Surgeon: Eulogio Bear, MD;  Location: [facility] ORS;  Service: Ophthalmology;  Laterality: Left;  Lot# [id number] H [place]: 01:02.0 AP%:15.3 CDE: 9.45  . CATARACT  EXTRACTION W/PHACO Right 10/31/2015   Procedure: CATARACT EXTRACTION PHACO AND INTRAOCULAR LENS PLACEMENT (IOC);  Surgeon: Eulogio Bear, MD;  Location: [facility] ORS;  Service: Ophthalmology;  Laterality: Right;  [place] 01:16.2 AP% 14.8 CDE 11.27 Fluid Pack Lot #  [id number] [place] CHOLECYSTECTOMY    . KNEE ARTHROSCOPY    . KYPHOPLASTY N/A 03/17/2019   Procedure: KYPHOPLASTY L2;  Surgeon: Hessie Knows, MD;  Location: [facility] ORS;  Service: Orthopedics;  Laterality: N/A;  . THYROIDECTOMY       Family History: Family History  Problem Relation Age of Onset  . Breast cancer Neg Hx      Social History: Social History   Socioeconomic History  . Marital status: Widowed    Spouse name: Not on file  . Number of children: Not on file  . Years of education: Not on file  . Highest education level: Not on file  Occupational History  . Not on file  Tobacco Use  . Smoking status: Never Smoker  . Smokeless tobacco: Never Used  Substance and Sexual Activity  . Alcohol use: No  . Drug use: No  . Sexual activity: Not on file  Other Topics Concern  . Not on file  Social History Narrative  . Not on file   Social Determinants of Health   Financial Resource Strain:   . Difficulty of Paying Living Expenses:   Food Insecurity:   . Worried About Charity fundraiser in the Last Year:   . Arboriculturist in the Last Year:   Transportation Needs:   . Film/video editor (Medical):   Marland Kitchen Lack of Transportation (Non-Medical):   Physical Activity:   . Days of Exercise per Week:   . Minutes of Exercise per Session:   Stress:   . Feeling of Stress :   Social Connections:   . Frequency of Communication with Friends and Family:   . Frequency of Social Gatherings with Friends and Family:   . Attends Religious Services:   . Active Member of Clubs or Organizations:   . Attends Archivist Meetings:   Marland Kitchen Marital Status:   Intimate Partner Violence:   . Fear of Current or  Ex-Partner:   . Emotionally Abused:   Marland Kitchen Physically Abused:   . Sexually Abused:      Review of Systems: Review of Systems  Constitutional: Negative.  Negative for chills, diaphoresis, fever, malaise/fatigue and weight loss.  HENT: Negative.  Negative for congestion, ear discharge, ear pain, hearing loss, nosebleeds, sinus pain, sore throat and tinnitus.   Eyes: Negative.  Negative for blurred vision, double vision, photophobia, pain, discharge and redness.  Respiratory: Positive for shortness of breath. Negative for cough, hemoptysis, sputum production, wheezing and stridor.   Cardiovascular: Positive for leg swelling. Negative for chest pain, palpitations, orthopnea, claudication and PND.  Gastrointestinal: Negative.  Negative for abdominal pain, blood in stool, constipation, diarrhea, heartburn, melena, nausea and vomiting.  Genitourinary: Negative.  Negative for dysuria, flank pain, frequency, hematuria and urgency.  Musculoskeletal: Negative.  Negative for back pain, falls, joint pain, myalgias and neck pain.  Skin: Negative.  Negative for itching and rash.  Neurological: Positive for weakness. Negative for dizziness, tingling, tremors, sensory change, speech change, focal weakness, seizures, loss of consciousness and headaches.  Endo/Heme/Allergies: Negative.  Negative for environmental allergies and polydipsia. Does not bruise/bleed easily.  Psychiatric/Behavioral: Negative.  Negative for depression, hallucinations, memory loss, substance abuse and suicidal ideas. The patient is not nervous/anxious and does not have insomnia.     Vital Signs: Blood pressure 108/70, pulse 99, temperature 98.6 F (37 C), resp. rate 18, height 5\' 1"  (1.549 m), weight 69.4 kg, SpO2 98 %.  Weight trends: Danley Danker  Weights   06/15/19 1045 06/15/19 2016 06/16/19 0240  Weight: 65.3 kg 69.5 kg 69.4 kg    Physical Exam: General: NAD, sitting up in bed  Head: Normocephalic, atraumatic. Moist oral mucosal  membranes  Eyes: Anicteric, PERRL  Neck: Supple, trachea midline  Lungs:  Crackles bilaterally  Heart: irregular  Abdomen:  + ascites  Extremities:  + peripheral edema.  Neurologic: Nonfocal, moving all four extremities  Skin: No lesions         Lab results: Basic Metabolic Panel: Recent Labs  Lab 06/15/19 1055 06/16/19 0553  NA 134* 135  K 3.7 4.1  CL 94* 95*  CO2 27 29  GLUCOSE 102* 98  BUN 35* 38*  CREATININE 2.38* 2.19*  CALCIUM 9.4 8.9    Liver Function Tests: Recent Labs  Lab 06/15/19 1055  AST 54*  ALT 14  ALKPHOS 79  BILITOT 2.7*  PROT 6.2*  ALBUMIN 2.5*   No results for input(s): LIPASE, AMYLASE in the last 168 hours. No results for input(s): AMMONIA in the last 168 hours.  CBC: Recent Labs  Lab 06/15/19 1055  WBC 7.3  HGB 11.4*  HCT 32.9*  MCV 102.2*  PLT 159    Cardiac Enzymes: No results for input(s): CKTOTAL, CKMB, CKMBINDEX, TROPONINI in the last 168 hours.  BNP: Invalid input(s): POCBNP  CBG: No results for input(s): GLUCAP in the last 168 hours.  Microbiology: Results for orders placed or performed during the hospital encounter of 06/15/19  Respiratory Panel by RT PCR (Flu A&B, Covid) - Nasopharyngeal Swab     Status: None   Collection Time: 06/15/19  1:05 PM   Specimen: Nasopharyngeal Swab  Result Value Ref Range Status   SARS Coronavirus 2 by RT PCR NEGATIVE NEGATIVE Final    Comment: (NOTE) SARS-CoV-2 target nucleic acids are NOT DETECTED. The SARS-CoV-2 RNA is generally detectable in upper respiratoy specimens during the acute phase of infection. The lowest concentration of SARS-CoV-2 viral copies this assay can detect is 131 copies/mL. A negative result does not preclude SARS-Cov-2 infection and should not be used as the sole basis for treatment or other patient management decisions. A negative result may occur with  improper specimen collection/handling, submission of specimen other than nasopharyngeal swab, presence  of viral mutation(s) within the areas targeted by this assay, and inadequate number of viral copies (<131 copies/mL). A negative result must be combined with clinical observations, patient history, and epidemiological information. The expected result is Negative. Fact Sheet for Patients:  PinkCheek.be Fact Sheet for Healthcare Providers:  GravelBags.it This test is not yet ap proved or cleared by the [place] FDA and  has been authorized for detection and/or diagnosis of SARS-CoV-2 by FDA under an Emergency Use Authorization (EUA). This EUA will remain  in effect (meaning this test can be used) for the duration of the COVID-19 declaration under Section 564(b)(1) of the Act, 21 U.S.C. section 360bbb-3(b)(1), unless the authorization is terminated or revoked sooner.    Influenza A by PCR NEGATIVE NEGATIVE Final   Influenza B by PCR NEGATIVE NEGATIVE Final    Comment: (NOTE) The Xpert Xpress SARS-CoV-2/FLU/RSV assay is intended as an aid in  the diagnosis of influenza from Nasopharyngeal swab specimens and  should not be used as a sole basis for treatment. Nasal washings and  aspirates are unacceptable for Xpert Xpress SARS-CoV-2/FLU/RSV  testing. Fact Sheet for Patients: PinkCheek.be Fact Sheet for Healthcare Providers: GravelBags.it This test is not yet approved or cleared by the [place]  FDA and  has been authorized for detection and/or diagnosis of SARS-CoV-2 by  FDA under an Emergency Use Authorization (EUA). This EUA will remain  in effect (meaning this test can be used) for the duration of the  Covid-19 declaration under Section 564(b)(1) of the Act, 21  U.S.C. section 360bbb-3(b)(1), unless the authorization is  terminated or revoked. Performed at [facility], [address] [place], [place] [address]     Coagulation Studies: Recent  Labs    06/15/19 1305  LABPROT 15.1  INR 1.2    Urinalysis: No results for input(s): COLORURINE, LABSPEC, PHURINE, GLUCOSEU, HGBUR, BILIRUBINUR, KETONESUR, PROTEINUR, UROBILINOGEN, NITRITE, LEUKOCYTESUR in the last 72 hours.  Invalid input(s): APPERANCEUR    Imaging: DG Chest Portable 1 View  Result Date: 06/15/2019 CLINICAL DATA:  Shortness of breath. EXAM: PORTABLE CHEST 1 VIEW COMPARISON:  June 16, 2017. FINDINGS: Stable cardiomegaly. No pneumothorax or significant pleural effusion is noted. Stable mild interstitial densities are noted throughout both lungs which may represent scarring, but acute superimposed edema or inflammation cannot be excluded. Bony thorax is unremarkable. IMPRESSION: Stable mild interstitial densities are noted throughout both lungs which may represent scarring, but acute superimposed edema or inflammation cannot be excluded. Electronically Signed   By: Marijo Conception M.D.   On: 06/15/2019 11:53   [place] ASCITES (ABDOMEN LIMITED)  Result Date: 06/15/2019 CLINICAL DATA:  Abdominal distension for 1 month EXAM: LIMITED ABDOMEN ULTRASOUND FOR ASCITES TECHNIQUE: Limited ultrasound survey for ascites was performed in all four abdominal quadrants. COMPARISON:  07/29/2018 FINDINGS: Scattered ascites identified in all 4 quadrants, greater in lower quadrants. RIGHT pleural effusion also identified. IMPRESSION: Ascites throughout abdomen as well as RIGHT pleural effusion. Electronically Signed   By: Lavonia Dana M.D.   On: 06/15/2019 15:19      Assessment & Plan: Desiree Koch is a 86 y.o. white female with systolic congestive heart failure, atrial fibrillation, hypertension, hypothyroidism, hepatic cirrhosis, coronary artery disease , who was admitted to [facility] on 06/15/2019 for Abdominal distention [R14.0] Anasarca [R60.1] CHF (congestive heart failure), NYHA class I, acute on chronic, systolic (HCC) [id number] Acute on chronic congestive heart failure, unspecified heart  failure type ([place]) [I50.9]  1. Acute renal failure on chronic kidney disease stage IIIB: baseline creatinine of 1.4, GFR of 36 on 04/13/2019. Urinalysis without hematuria or proteinuria.  No IV contrast exposure Chronic Kidney Disease most likely due to hypertensive nephrosclerosis - Check renal ultrasound - CKD work up: SPEP/UPEP, hepatitis panel, urine studies - Not on an ARB. Has history of ACE-I due to cough.   2. Hypertension with acute exacerbation of systolic congestive heart failure. Echocardiogram for this admission pending Home regimen of torsemide 40mg  daily.  Started on digoxin this admission.  - will resume IV furosemide - IV albumin - Consider paracentesis - Spironolactone may be an option in the future.   3. Anemia with chronic kidney disease: macrocytic - Check iron studies and anemia labs.   LOS: 1 Wagner Tanzi 3/19/202110:49 AM

## 2019-06-16 NOTE — Progress Notes (Signed)
*  PRELIMINARY RESULTS* Echocardiogram 2D Echocardiogram has been performed.  Desiree Koch 06/16/2019, 10:35 AM

## 2019-06-16 NOTE — Consult Note (Signed)
CARDIOLOGY CONSULT NOTE               Patient ID: Desiree Koch MRN: [id number] DOB/AGE: 84-Jan-1932 84 y.o.  Admit date: 06/15/2019 Referring Physician Dr. Francine Graven hospitalist Primary Physician Dr. Kerrin Mo primary Primary Cardiologist Dr. Nehemiah Massed Reason for Consultation chest pain angina heart failure atrial fibrillation  HPI: Patient is a 86 year old white female multiple medical problems known coronary disease known congestive heart failure systolic dysfunction known cardiomyopathy thought to be ischemic left bundle branch block atrial fibrillation atrial flutter poor anticoagulation candidate because of bleeding acute on chronic renal insufficiency heart murmur hyperlipidemia history of hypertension previous myocardial infarction cirrhosis of the liver states that she was at home and started having acute chest discomfort in the left chest area moderate in nature relatively persistent and eventually she was referred to the emergency room for further evaluation.  Chest pain is somewhat better now she has had some shortness of breath she has had significant leg edema no blackout spells or syncope no nausea vomiting no fever chills or sweats the patient has had a Covid shots feels much improved now since presentation.  Patient was last seen by [facility] office for chronic atrial fibrillation atrial flutter.  Patient's had generalized weakness hard of hearing now presents for further cardiac assessment.  Patient had flat troponins were elevated BNP chest x-ray also suggest possible heart failure.  Previous echocardiogram suggests ejection fraction around 30%  Review of systems complete and found to be negative unless listed above     Past Medical History:  Diagnosis Date  . Arthritis   . Cancer ([place]) 2018   skin ca  . Cardiac arrhythmia   . Coronary artery disease   . CRD (chronic renal disease)   . Dysrhythmia   . Heart murmur   . HOH (hard of hearing)   . Hypercholesteremia    . Hypertension   . Hypothyroidism   . Leaky heart valve   . Liver damage   . Lower extremity edema   . Multiple allergies   . Myocardial infarction ([place])   . Osteoporosis   . Pneumonia     Past Surgical History:  Procedure Laterality Date  . ABDOMINAL HYSTERECTOMY    . CATARACT EXTRACTION W/PHACO Left 09/12/2015   Procedure: CATARACT EXTRACTION PHACO AND INTRAOCULAR LENS PLACEMENT (IOC);  Surgeon: Eulogio Bear, MD;  Location: [facility] ORS;  Service: Ophthalmology;  Laterality: Left;  Lot# [id number] H [place]: 01:02.0 AP%:15.3 CDE: 9.45  . CATARACT EXTRACTION W/PHACO Right 10/31/2015   Procedure: CATARACT EXTRACTION PHACO AND INTRAOCULAR LENS PLACEMENT (IOC);  Surgeon: Eulogio Bear, MD;  Location: [facility] ORS;  Service: Ophthalmology;  Laterality: Right;  [place] 01:16.2 AP% 14.8 CDE 11.27 Fluid Pack Lot # [id number]  . CESAREAN SECTION    . CHOLECYSTECTOMY    . KNEE ARTHROSCOPY    . KYPHOPLASTY N/A 03/17/2019   Procedure: KYPHOPLASTY L2;  Surgeon: Hessie Knows, MD;  Location: [facility] ORS;  Service: Orthopedics;  Laterality: N/A;  . THYROIDECTOMY      Medications Prior to Admission  Medication Sig Dispense Refill Last Dose  . cholecalciferol (VITAMIN D3) 25 MCG (1000 UT) tablet Take 2,000 Units by mouth daily.    06/14/2019 at Unknown time  . fexofenadine (ALLEGRA) 180 MG tablet Take 180 mg by mouth at bedtime.    06/14/2019 at Unknown time  . fluticasone (FLONASE) 50 MCG/ACT nasal spray Place 2 sprays into both nostrils daily as needed for rhinitis.    06/14/2019 at Unknown time  .  Glucosamine 500 MG CAPS Take 500 mg by mouth daily.    06/14/2019 at Unknown time  . levothyroxine (SYNTHROID, LEVOTHROID) 75 MCG tablet Take 75 mcg by mouth daily.   06/14/2019 at Unknown time  . metoprolol succinate (TOPROL-XL) 100 MG 24 hr tablet Take 100 mg by mouth 2 (two) times daily.   06/14/2019 at Unknown time  . Multiple Vitamins-Minerals (PRESERVISION AREDS 2+MULTI VIT PO) Take 2 tablets by mouth daily.    06/14/2019 at Unknown time  . potassium chloride SA (KLOR-CON) 20 MEQ tablet Take 20 mEq by mouth daily.   06/14/2019 at Unknown time  . torsemide (DEMADEX) 20 MG tablet Take 40 mg by mouth daily.   06/14/2019 at Unknown time  . acetaminophen (TYLENOL) 325 MG tablet Take 325-650 mg by mouth every 6 (six) hours as needed for mild pain or moderate pain.    PRN at PRN   Social History   Socioeconomic History  . Marital status: Widowed    Spouse name: Not on file  . Number of children: Not on file  . Years of education: Not on file  . Highest education level: Not on file  Occupational History  . Not on file  Tobacco Use  . Smoking status: Never Smoker  . Smokeless tobacco: Never Used  Substance and Sexual Activity  . Alcohol use: No  . Drug use: No  . Sexual activity: Not on file  Other Topics Concern  . Not on file  Social History Narrative  . Not on file   Social Determinants of Health   Financial Resource Strain:   . Difficulty of Paying Living Expenses:   Food Insecurity:   . Worried About Charity fundraiser in the Last Year:   . Arboriculturist in the Last Year:   Transportation Needs:   . Film/video editor (Medical):   Marland Kitchen Lack of Transportation (Non-Medical):   Physical Activity:   . Days of Exercise per Week:   . Minutes of Exercise per Session:   Stress:   . Feeling of Stress :   Social Connections:   . Frequency of Communication with Friends and Family:   . Frequency of Social Gatherings with Friends and Family:   . Attends Religious Services:   . Active Member of Clubs or Organizations:   . Attends Archivist Meetings:   Marland Kitchen Marital Status:   Intimate Partner Violence:   . Fear of Current or Ex-Partner:   . Emotionally Abused:   Marland Kitchen Physically Abused:   . Sexually Abused:     Family History  Problem Relation Age of Onset  . Breast cancer Neg Hx       Review of systems complete and found to be negative unless listed above      PHYSICAL  EXAM  General: Well developed, well nourished, in no acute distress HEENT:  Normocephalic and atramatic Neck:  No JVD.  Lungs: Clear bilaterally to auscultation and percussion. Heart: HRRR . Normal S1 and S2 without gallops or 2/6 sem murmurs.  Abdomen: Bowel sounds are positive, abdomen soft and non-tender  Msk:  Back normal, normal gait. Normal strength and tone for age. Extremities: No clubbing, cyanosis or edema.   Neuro: Alert and oriented X 3. Psych:  Good affect, responds appropriately  Labs:   Lab Results  Component Value Date   WBC 7.3 06/15/2019   HGB 11.4 (L) 06/15/2019   HCT 32.9 (L) 06/15/2019   MCV 102.2 (H) 06/15/2019  PLT 159 06/15/2019    Recent Labs  Lab 06/15/19 1055 06/15/19 1055 06/16/19 0553  NA 134*   < > 135  K 3.7   < > 4.1  CL 94*   < > 95*  CO2 27   < > 29  BUN 35*   < > 38*  CREATININE 2.38*   < > 2.19*  CALCIUM 9.4   < > 8.9  PROT 6.2*  --   --   BILITOT 2.7*  --   --   ALKPHOS 79  --   --   ALT 14  --   --   AST 54*  --   --   GLUCOSE 102*   < > 98   < > = values in this interval not displayed.   Lab Results  Component Value Date   TROPONINI <0.03 06/16/2017   No results found for: CHOL No results found for: HDL No results found for: LDLCALC No results found for: TRIG No results found for: CHOLHDL No results found for: LDLDIRECT    Radiology: DG Chest Portable 1 View  Result Date: 06/15/2019 CLINICAL DATA:  Shortness of breath. EXAM: PORTABLE CHEST 1 VIEW COMPARISON:  June 16, 2017. FINDINGS: Stable cardiomegaly. No pneumothorax or significant pleural effusion is noted. Stable mild interstitial densities are noted throughout both lungs which may represent scarring, but acute superimposed edema or inflammation cannot be excluded. Bony thorax is unremarkable. IMPRESSION: Stable mild interstitial densities are noted throughout both lungs which may represent scarring, but acute superimposed edema or inflammation cannot be excluded.  Electronically Signed   By: Marijo Conception M.D.   On: 06/15/2019 11:53   [place] ASCITES (ABDOMEN LIMITED)  Result Date: 06/15/2019 CLINICAL DATA:  Abdominal distension for 1 month EXAM: LIMITED ABDOMEN ULTRASOUND FOR ASCITES TECHNIQUE: Limited ultrasound survey for ascites was performed in all four abdominal quadrants. COMPARISON:  07/29/2018 FINDINGS: Scattered ascites identified in all 4 quadrants, greater in lower quadrants. RIGHT pleural effusion also identified. IMPRESSION: Ascites throughout abdomen as well as RIGHT pleural effusion. Electronically Signed   By: Lavonia Dana M.D.   On: 06/15/2019 15:19    EKG: Atrial fibrillation left bundle branch block nonspecific ST-T wave changes tachycardic rapid ventricular response  ASSESSMENT AND PLAN:  Congestive heart failure systolic dysfunction Dyspnea Paroxysmal atrial fibrillation Chronic left bundle branch block Coronary artery disease Acute on chronic renal insufficiency Cirrhosis of the liver Lower extremity edema bilateral Cardiac murmur History of hypertension . Plan Agree with admit to telemetry rule out for myocardial infarction Follow-up EKGs and troponins Agree with increasing diuretics intravenously to help with heart failure Would continue beta-blocker Poor anticoagulation candidate because of previous severe epistaxis Agree with echocardiogram for reassessment of left ventricular function Continue low-sodium diet Recommend increase activity Conservative cardiac therapy at this point do not recommend invasive strategy  Signed: Yolonda Kida MD 06/16/2019, 8:32 AM

## 2019-06-17 LAB — BASIC METABOLIC PANEL
Anion gap: 10 (ref 5–15)
BUN: 35 mg/dL — ABNORMAL HIGH (ref 8–23)
CO2: 29 mmol/L (ref 22–32)
Calcium: 8.7 mg/dL — ABNORMAL LOW (ref 8.9–10.3)
Chloride: 98 mmol/L (ref 98–111)
Creatinine, Ser: 1.95 mg/dL — ABNORMAL HIGH (ref 0.44–1.00)
GFR calc Af Amer: 26 mL/min — ABNORMAL LOW (ref >60)
GFR calc non Af Amer: 23 mL/min — ABNORMAL LOW (ref >60)
Glucose, Bld: 83 mg/dL (ref 70–99)
Potassium: 3.2 mmol/L — ABNORMAL LOW (ref 3.5–5.1)
Sodium: 137 mmol/L (ref 135–145)

## 2019-06-17 LAB — CBC
HCT: 26 % — ABNORMAL LOW (ref 36.0–46.0)
Hemoglobin: 8.7 g/dL — ABNORMAL LOW (ref 12.0–15.0)
MCH: 35.1 pg — ABNORMAL HIGH (ref 26.0–34.0)
MCHC: 33.5 g/dL (ref 30.0–36.0)
MCV: 104.8 fL — ABNORMAL HIGH (ref 80.0–100.0)
Platelets: 117 10*3/uL — ABNORMAL LOW (ref 150–400)
RBC: 2.48 MIL/uL — ABNORMAL LOW (ref 3.87–5.11)
RDW: 15.9 % — ABNORMAL HIGH (ref 11.5–15.5)
WBC: 6 10*3/uL (ref 4.0–10.5)
nRBC: 0 % (ref 0.0–0.2)

## 2019-06-17 LAB — PROTEIN, BODY FLUID (OTHER): Total Protein, Body Fluid Other: 0.8 g/dL

## 2019-06-17 LAB — HEPATITIS B CORE ANTIBODY, IGM: Hep B C IgM: NONREACTIVE

## 2019-06-17 LAB — VITAMIN B12: Vitamin B-12: 1689 pg/mL — ABNORMAL HIGH (ref 180–914)

## 2019-06-17 LAB — HEPATITIS B SURFACE ANTIGEN: Hepatitis B Surface Ag: NONREACTIVE

## 2019-06-17 LAB — HEPATITIS B SURFACE ANTIBODY,QUALITATIVE: Hep B S Ab: NONREACTIVE

## 2019-06-17 LAB — HEPATITIS C ANTIBODY: HCV Ab: NONREACTIVE

## 2019-06-17 LAB — MAGNESIUM: Magnesium: 1.8 mg/dL (ref 1.7–2.4)

## 2019-06-17 MED ORDER — MIDODRINE HCL 5 MG PO TABS
5.0000 mg | ORAL_TABLET | Freq: Three times a day (TID) | ORAL | Status: DC
  Administered 2019-06-18 – 2019-06-19 (×4): 5 mg via ORAL
  Filled 2019-06-17 (×4): qty 1

## 2019-06-17 MED ORDER — POTASSIUM CHLORIDE CRYS ER 20 MEQ PO TBCR
40.0000 meq | EXTENDED_RELEASE_TABLET | Freq: Once | ORAL | Status: AC
  Administered 2019-06-17: 40 meq via ORAL
  Filled 2019-06-17: qty 2

## 2019-06-17 MED ORDER — ENSURE ENLIVE PO LIQD
237.0000 mL | Freq: Two times a day (BID) | ORAL | Status: DC
  Administered 2019-06-17 – 2019-06-20 (×3): 237 mL via ORAL

## 2019-06-17 NOTE — Progress Notes (Signed)
Lamar visited pt. while rounding on 2A; pt.'s son Coralyn Pear) and dtr. Leveda Anna) in rm.  Pt. had just returned from ultrasound; 'they took 3 liters of fluid off my abdomen', she said.  Pt. seemed to be coping well and expressed feeling better today than yesterday; eager to be able to get up and walk again.  Family and pt. requested prayer for pt.'s healing and for strength for the family.  No further needs expressed at this time.    06/16/19 1530  Clinical Encounter Type  Visited With Patient and family together  Visit Type Initial;Psychological support;Spiritual support;Social support  Referral From Other (Comment) (Routine Rounding)  Spiritual Encounters  Spiritual Needs Prayer;Emotional  Stress Factors  Patient Stress Factors Health changes;Major life changes  Family Stress Factors Loss of control;Major life changes;Health changes

## 2019-06-17 NOTE — Progress Notes (Signed)
[facility]    SUBJECTIVE: Patient states to be doing somewhat better still weak still fatigued improved after paracentesis.  Patient still has significant tachycardia heart rate of about 115.  Denies any shortness of breath.  No chest pain no palpitations no tachycardia.  Would like to go home soon   Vitals:   06/17/19 0702 06/17/19 0914 06/17/19 0955 06/17/19 1140  BP:  118/74  99/66  Pulse:  (!) 118  (!) 117  Resp: (!) 22 16 (!) 22 20  Temp:  97.8 F (36.6 C)  98 F (36.7 C)  TempSrc:  Oral    SpO2:  99%  99%  Weight:      Height:         Intake/Output Summary (Last 24 hours) at 06/17/2019 1411 Last data filed at 06/17/2019 0758 Gross per 24 hour  Intake 340 ml  Output 1150 ml  Net -810 ml      PHYSICAL EXAM  General: Well developed, well nourished, in no acute distress HEENT:  Normocephalic and atramatic Neck:  No JVD.  Lungs: Clear bilaterally to auscultation and percussion. Heart: HRRR . Normal S1 and S2 without gallops or murmurs.  Abdomen: Bowel sounds are positive, abdomen soft and non-tender distended abdomen from ascites Msk:  Back normal, normal gait. Normal strength and tone for age. Extremities: No clubbing, cyanosis or edema.   Neuro: Alert and oriented X 3. Psych:  Good affect, responds appropriately   LABS: Basic Metabolic Panel: Recent Labs    06/16/19 0553 06/17/19 0450  NA 135 137  K 4.1 3.2*  CL 95* 98  CO2 29 29  GLUCOSE 98 83  BUN 38* 35*  CREATININE 2.19* 1.95*  CALCIUM 8.9 8.7*  MG  --  1.8   Liver Function Tests: Recent Labs    06/15/19 1055  AST 54*  ALT 14  ALKPHOS 79  BILITOT 2.7*  PROT 6.2*  ALBUMIN 2.5*   No results for input(s): LIPASE, AMYLASE in the last 72 hours. CBC: Recent Labs    06/15/19 1055 06/17/19 0450  WBC 7.3 6.0  HGB 11.4* 8.7*  HCT 32.9* 26.0*  MCV 102.2* 104.8*  PLT 159 117*   Cardiac Enzymes: No results for input(s): CKTOTAL, CKMB, CKMBINDEX, TROPONINI in the last 72  hours. BNP: Invalid input(s): POCBNP D-Dimer: No results for input(s): DDIMER in the last 72 hours. Hemoglobin A1C: No results for input(s): HGBA1C in the last 72 hours. Fasting Lipid Panel: No results for input(s): CHOL, HDL, LDLCALC, TRIG, CHOLHDL, LDLDIRECT in the last 72 hours. Thyroid Function Tests: Recent Labs    06/15/19 1305  TSH 2.979   Anemia Panel: Recent Labs    06/16/19 1711  VITAMINB12 1,689*  FOLATE 22.0  FERRITIN 238  TIBC 200*  IRON 61    US RENAL  Result Date: 06/16/2019 CLINICAL DATA:  Acute kidney failure.  Chronic kidney disease. EXAM: RENAL / URINARY TRACT ULTRASOUND COMPLETE COMPARISON:  07/29/2018, ultrasound 02/22/2017 FINDINGS: Right Kidney: Renal measurements: 9.4 x 4.4 x 4.2 centimeters = volume: 90 mL. On previous ultrasound 02/22/2017, the RIGHT kidney measured 10.1 centimeters. Two small exophytic lesions are identified in the midpole region, measuring 0.9 and 1.2 centimeters. These lesions likely represent cysts given stability since 2018. Left Kidney: Renal measurements: 8.4 x 4.7 x 4.1 centimeters = volume: 86 mL. On previous ultrasound 02/22/2017, the LEFT kidney measured 10.0 centimeters. Echogenicity within normal limits. No mass or hydronephrosis visualized. Bladder: Decompressed Other: None. IMPRESSION: Kidneys are smaller compared to prior  studies.  No hydronephrosis. Stable exophytic RIGHT renal cysts. Electronically Signed   By: Nolon Nations M.D.   On: 06/16/2019 14:45   US Paracentesis  Result Date: 06/16/2019 INDICATION: Acute renal failure with ascites. EXAM: ULTRASOUND GUIDED  PARACENTESIS MEDICATIONS: None. COMPLICATIONS: None immediate. PROCEDURE: Informed written consent was obtained from the patient after a discussion of the risks, benefits and alternatives to treatment. A timeout was performed prior to the initiation of the procedure. Initial ultrasound scanning demonstrates a large amount of ascites within the left lower  abdominal quadrant. The left lower abdomen was prepped and draped in the usual sterile fashion. 1% lidocaine was used for local anesthesia. Following this, a 6 Fr Safe-T-Centesis catheter was introduced. An ultrasound image was saved for documentation purposes. The paracentesis was performed. The catheter was removed and a dressing was applied. The patient tolerated the procedure well without immediate post procedural complication. FINDINGS: A total of approximately 3 L of pink colored fluid was removed. Samples were sent to the laboratory as requested by the clinical team. Max volume to be removed was 3 L and there was residual fluid after the paracentesis. IMPRESSION: Successful ultrasound-guided paracentesis yielding 3 liters of peritoneal fluid. Electronically Signed   By: Markus Daft M.D.   On: 06/16/2019 14:51   ECHOCARDIOGRAM COMPLETE  Result Date: 06/16/2019    ECHOCARDIOGRAM REPORT   Patient Name:   Desiree Koch Date of Exam: 06/16/2019 Medical Rec #:  [id number]       Height:       61.0 in Accession #:    [id number]      Weight:       153.0 lb Date of Birth:  1930/09/02      BSA:          1.686 m Patient Age:    86 years        BP:           108/70 mmHg Patient Gender: F               HR:           99 bpm. Exam Location:  [facility] Procedure: 2D Echo, Color Doppler and Cardiac Doppler Indications:     CHF- acute diastolic [id number]  History:         Patient has prior history of Echocardiogram examinations, most                  recent 06/17/2017. CAD; Risk Factors:Hypertension. Pneumonia.  Sonographer:     Sherrie Sport RDCS (AE) Referring Phys:  [id number] Collier Bullock Diagnosing Phys: Yolonda Kida MD IMPRESSIONS  1. Left ventricular ejection fraction, by estimation, is 20 to 25%. The left ventricle has severely decreased function. The left ventricle demonstrates regional wall motion abnormalities (see scoring diagram/findings for description). The left ventricular internal cavity size was mildly to  moderately dilated. Left ventricular diastolic parameters were normal.  2. Right ventricular systolic function is mildly reduced. The right ventricular size is mildly enlarged. There is mildly elevated pulmonary artery systolic pressure.  3. Left atrial size was moderately dilated.  4. Right atrial size was moderately dilated.  5. The mitral valve is grossly normal. Moderate mitral valve regurgitation.  6. Tricuspid valve regurgitation is moderate.  7. The aortic valve is grossly normal. Aortic valve regurgitation is not visualized. FINDINGS  Left Ventricle: Left ventricular ejection fraction, by estimation, is 20 to 25%. The left ventricle has severely decreased function. The left ventricle demonstrates regional wall  motion abnormalities. The left ventricular internal cavity size was mildly  to moderately dilated. There is no left ventricular hypertrophy. Abnormal (paradoxical) septal motion, consistent with left bundle branch block. Left ventricular diastolic parameters were normal. Right Ventricle: The right ventricular size is mildly enlarged. No increase in right ventricular wall thickness. Right ventricular systolic function is mildly reduced. There is mildly elevated pulmonary artery systolic pressure. The tricuspid regurgitant  velocity is 2.61 m/s, and with an assumed right atrial pressure of 10 mmHg, the estimated right ventricular systolic pressure is 0000000 mmHg. Left Atrium: Left atrial size was moderately dilated. Right Atrium: Right atrial size was moderately dilated. Pericardium: There is no evidence of pericardial effusion. Mitral Valve: The mitral valve is grossly normal. Moderate mitral valve regurgitation. Tricuspid Valve: The tricuspid valve is grossly normal. Tricuspid valve regurgitation is moderate. Aortic Valve: The aortic valve is grossly normal. Aortic valve regurgitation is not visualized. Aortic valve mean gradient measures 2.2 mmHg. Aortic valve peak gradient measures 3.6 mmHg. Aortic  valve area, by VTI measures 1.92 cm. Pulmonic Valve: The pulmonic valve was normal in structure. Pulmonic valve regurgitation is not visualized. Aorta: The aortic root is normal in size and structure. IAS/Shunts: No atrial level shunt detected by color flow Doppler.  LEFT VENTRICLE PLAX 2D LVIDd:         3.62 cm LVIDs:         3.33 cm LV PW:         1.16 cm LV IVS:        1.33 cm LVOT diam:     2.00 cm LV SV:         28 LV SV Index:   17 LVOT Area:     3.14 cm  LV Volumes (MOD) LV vol d, MOD A2C: 71.3 ml LV vol d, MOD A4C: 74.8 ml LV vol s, MOD A2C: 47.9 ml LV vol s, MOD A4C: 58.6 ml LV SV MOD A2C:     23.4 ml LV SV MOD A4C:     74.8 ml LV SV MOD BP:      20.8 ml RIGHT VENTRICLE RV S prime:     12.60 cm/s TAPSE (M-mode): 2.7 cm LEFT ATRIUM             Index       RIGHT ATRIUM           Index LA diam:        4.90 cm 2.91 cm/m  RA Area:     19.20 cm LA Vol (A2C):   72.8 ml 43.19 ml/m RA Volume:   51.30 ml  30.44 ml/m LA Vol (A4C):   81.9 ml 48.59 ml/m LA Biplane Vol: 83.1 ml 49.30 ml/m  AORTIC VALVE                   PULMONIC VALVE AV Area (Vmax):    1.81 cm    PV Vmax:        0.49 m/s AV Area (Vmean):   1.65 cm    PV Peak grad:   1.0 mmHg AV Area (VTI):     1.92 cm    RVOT Peak grad: 2 mmHg AV Vmax:           95.05 cm/s AV Vmean:          69.900 cm/s AV VTI:            0.147 m AV Peak Grad:      3.6 mmHg AV Mean Grad:  2.2 mmHg LVOT Vmax:         54.70 cm/s LVOT Vmean:        36.800 cm/s LVOT VTI:          0.090 m LVOT/AV VTI ratio: 0.61  AORTA Ao Root diam: 2.50 cm MITRAL VALVE               TRICUSPID VALVE MV Area (PHT): 5.13 cm    TR Peak grad:   27.2 mmHg MV Decel Time: 148 msec    TR Vmax:        261.00 cm/s MV E velocity: 92.20 cm/s                            SHUNTS                            Systemic VTI:  0.09 m                            Systemic Diam: 2.00 cm Sandrina Heaton Prince Rome MD Electronically signed by Yolonda Kida MD Signature Date/Time: 06/16/2019/1:26:31 PM    Final    [place] ASCITES  (ABDOMEN LIMITED)  Result Date: 06/15/2019 CLINICAL DATA:  Abdominal distension for 1 month EXAM: LIMITED ABDOMEN ULTRASOUND FOR ASCITES TECHNIQUE: Limited ultrasound survey for ascites was performed in all four abdominal quadrants. COMPARISON:  07/29/2018 FINDINGS: Scattered ascites identified in all 4 quadrants, greater in lower quadrants. RIGHT pleural effusion also identified. IMPRESSION: Ascites throughout abdomen as well as RIGHT pleural effusion. Electronically Signed   By: Lavonia Dana M.D.   On: 06/15/2019 15:19     Echo severely depressed left ventricular function ejection fraction between 20 and 25% probably ischemic  TELEMETRY: Atrial fibrillation rapid ventricular response bundle branch block:  ASSESSMENT AND PLAN:  Principal Problem:   Acute on chronic systolic CHF (congestive heart failure) (HCC) Active Problems:   AKI (acute kidney injury) ([place])   AF (paroxysmal atrial fibrillation) (HCC)   Hypothyroidism   CAD (coronary artery disease)   CHF (congestive heart failure), NYHA class I, acute on chronic, systolic (HCC) Generalized ascites Cirrhosis of the liver . Plan Continue current therapy with telemetry Status post paracentesis may need a second 1 to help treat ascites Continue supplemental albumin therapy Continue metoprolol for rate control for atrial fibrillation Poor anticoagulation candidate Acute on chronic systolic congestive heart failure continue current therapy Acute on chronic renal insufficiency recommend nephrology input Multivessel coronary disease continue conservative management     Yolonda Kida, MD, 06/17/2019 2:11 PM

## 2019-06-17 NOTE — Progress Notes (Signed)
[place], [place] 06/17/19  Subjective:   Hospital day # 2 Patient underwent paracentesis yesterday 3 L of fluid was removed This morning feels better.  Abdomen is softer Continues to have lower extremity edema Feels like she is voiding more frequently   Renal: 03/19 0701 - 03/20 0700 In: 580 [P.O.:480; IV Piggyback:100] Out: 1050 [Urine:1050] Lab Results  Component Value Date   CREATININE 1.95 (H) 06/17/2019   CREATININE 2.19 (H) 06/16/2019   CREATININE 2.38 (H) 06/15/2019     Objective:  Vital signs in last 24 hours:  Temp:  [97.6 F (36.4 C)-98.5 F (36.9 C)] 97.8 F (36.6 C) (03/20 0914) Pulse Rate:  [111-119] 118 (03/20 0914) Resp:  [16-22] 22 (03/20 0955) BP: (85-118)/(56-77) 118/74 (03/20 0914) SpO2:  [96 %-100 %] 99 % (03/20 0914) Weight:  [66.7 kg] 66.7 kg (03/20 0225)  Weight change: 1.406 kg Filed Weights   06/15/19 2016 06/16/19 0240 06/17/19 0225  Weight: 69.5 kg 69.4 kg 66.7 kg    Intake/Output:    Intake/Output Summary (Last 24 hours) at 06/17/2019 1116 Last data filed at 06/17/2019 0758 Gross per 24 hour  Intake 340 ml  Output 1150 ml  Net -810 ml     Physical Exam: General:  No acute distress, laying in the  HEENT  head anicteric, moist oral mucous membrane  Pulm/lungs  normal breathing effort, clear to auscultate  CVS/Heart  no rub or gallop  Abdomen:   Soft, distended, ascites  Extremities:  2+ lower extremity edema  Neurologic:  Alert, oriented  Skin:  No acute rashes    Basic Metabolic Panel:  Recent Labs  Lab 06/15/19 1055 06/16/19 0553 06/17/19 0450  NA 134* 135 137  K 3.7 4.1 3.2*  CL 94* 95* 98  CO2 27 29 29   GLUCOSE 102* 98 83  BUN 35* 38* 35*  CREATININE 2.38* 2.19* 1.95*  CALCIUM 9.4 8.9 8.7*  MG  --   --  1.8     CBC: Recent Labs  Lab 06/15/19 1055 06/17/19 0450  WBC 7.3 6.0  HGB 11.4* 8.7*  HCT 32.9* 26.0*  MCV 102.2* 104.8*  PLT 159 117*      Lab Results   Component Value Date   HEPBSAG NON REACTIVE 06/17/2019   HEPBSAB NON REACTIVE 06/17/2019      Microbiology:  Recent Results (from the past 240 hour(s))  Respiratory Panel by RT PCR (Flu A&B, Covid) - Nasopharyngeal Swab     Status: None   Collection Time: 06/15/19  1:05 PM   Specimen: Nasopharyngeal Swab  Result Value Ref Range Status   SARS Coronavirus 2 by RT PCR NEGATIVE NEGATIVE Final    Comment: (NOTE) SARS-CoV-2 target nucleic acids are NOT DETECTED. The SARS-CoV-2 RNA is generally detectable in upper respiratoy specimens during the acute phase of infection. The lowest concentration of SARS-CoV-2 viral copies this assay can detect is 131 copies/mL. A negative result does not preclude SARS-Cov-2 infection and should not be used as the sole basis for treatment or other patient management decisions. A negative result may occur with  improper specimen collection/handling, submission of specimen other than nasopharyngeal swab, presence of viral mutation(s) within the areas targeted by this assay, and inadequate number of viral copies (<131 copies/mL). A negative result must be combined with clinical observations, patient history, and epidemiological information. The expected result is Negative. Fact Sheet for Patients:  PinkCheek.be Fact Sheet for Healthcare Providers:  GravelBags.it This test is not yet ap proved or  cleared by the [place] and  has been authorized for detection and/or diagnosis of SARS-CoV-2 by FDA under an Emergency Use Authorization (EUA). This EUA will remain  in effect (meaning this test can be used) for the duration of the COVID-19 declaration under Section 564(b)(1) of the Act, 21 U.S.C. section 360bbb-3(b)(1), unless the authorization is terminated or revoked sooner.    Influenza A by PCR NEGATIVE NEGATIVE Final   Influenza B by PCR NEGATIVE NEGATIVE Final    Comment: (NOTE) The  Xpert Xpress SARS-CoV-2/FLU/RSV assay is intended as an aid in  the diagnosis of influenza from Nasopharyngeal swab specimens and  should not be used as a sole basis for treatment. Nasal washings and  aspirates are unacceptable for Xpert Xpress SARS-CoV-2/FLU/RSV  testing. Fact Sheet for Patients: PinkCheek.be Fact Sheet for Healthcare Providers: GravelBags.it This test is not yet approved or cleared by the [place] FDA and  has been authorized for detection and/or diagnosis of SARS-CoV-2 by  FDA under an Emergency Use Authorization (EUA). This EUA will remain  in effect (meaning this test can be used) for the duration of the  Covid-19 declaration under Section 564(b)(1) of the Act, 21  U.S.C. section 360bbb-3(b)(1), unless the authorization is  terminated or revoked. Performed at [facility], [address] [place], [place] [address]   Body fluid culture     Status: None (Preliminary result)   Collection Time: 06/16/19  2:42 PM   Specimen: PATH Cytology Peritoneal fluid  Result Value Ref Range Status   Specimen Description   Final    PERITONEAL Performed at [facility], [address], [place], [place] [address]    Special Requests   Final    NONE Performed at [facility], [address] [place], [place] [address]    Gram Stain   Final    FEW WBC PRESENT, PREDOMINANTLY MONONUCLEAR NO ORGANISMS SEEN    Culture   Final    NO GROWTH < 12 HOURS Performed at [facility], [place] [address], [place], [place] [address]    Report Status PENDING  Incomplete    Coagulation Studies: Recent Labs    06/15/19 1305  LABPROT 15.1  INR 1.2    Urinalysis: No results for input(s): COLORURINE, LABSPEC, PHURINE, GLUCOSEU, HGBUR, BILIRUBINUR, KETONESUR, PROTEINUR, UROBILINOGEN, NITRITE, LEUKOCYTESUR in the last 72 hours.  Invalid input(s): APPERANCEUR    Imaging: US RENAL  Result  Date: 06/16/2019 CLINICAL DATA:  Acute kidney failure.  Chronic kidney disease. EXAM: RENAL / URINARY TRACT ULTRASOUND COMPLETE COMPARISON:  07/29/2018, ultrasound 02/22/2017 FINDINGS: Right Kidney: Renal measurements: 9.4 x 4.4 x 4.2 centimeters = volume: 90 mL. On previous ultrasound 02/22/2017, the RIGHT kidney measured 10.1 centimeters. Two small exophytic lesions are identified in the midpole region, measuring 0.9 and 1.2 centimeters. These lesions likely represent cysts given stability since 2018. Left Kidney: Renal measurements: 8.4 x 4.7 x 4.1 centimeters = volume: 86 mL. On previous ultrasound 02/22/2017, the LEFT kidney measured 10.0 centimeters. Echogenicity within normal limits. No mass or hydronephrosis visualized. Bladder: Decompressed Other: None. IMPRESSION: Kidneys are smaller compared to prior studies.  No hydronephrosis. Stable exophytic RIGHT renal cysts. Electronically Signed   By: Nolon Nations M.D.   On: 06/16/2019 14:45   US Paracentesis  Result Date: 06/16/2019 INDICATION: Acute renal failure with ascites. EXAM: ULTRASOUND GUIDED  PARACENTESIS MEDICATIONS: None. COMPLICATIONS: None immediate. PROCEDURE: Informed written consent was obtained from the patient after a discussion of the risks, benefits  and alternatives to treatment. A timeout was performed prior to the initiation of the procedure. Initial ultrasound scanning demonstrates a large amount of ascites within the left lower abdominal quadrant. The left lower abdomen was prepped and draped in the usual sterile fashion. 1% lidocaine was used for local anesthesia. Following this, a 6 Fr Safe-T-Centesis catheter was introduced. An ultrasound image was saved for documentation purposes. The paracentesis was performed. The catheter was removed and a dressing was applied. The patient tolerated the procedure well without immediate post procedural complication. FINDINGS: A total of approximately 3 L of pink colored fluid was removed.  Samples were sent to the laboratory as requested by the clinical team. Max volume to be removed was 3 L and there was residual fluid after the paracentesis. IMPRESSION: Successful ultrasound-guided paracentesis yielding 3 liters of peritoneal fluid. Electronically Signed   By: Markus Daft M.D.   On: 06/16/2019 14:51   DG Chest Portable 1 View  Result Date: 06/15/2019 CLINICAL DATA:  Shortness of breath. EXAM: PORTABLE CHEST 1 VIEW COMPARISON:  June 16, 2017. FINDINGS: Stable cardiomegaly. No pneumothorax or significant pleural effusion is noted. Stable mild interstitial densities are noted throughout both lungs which may represent scarring, but acute superimposed edema or inflammation cannot be excluded. Bony thorax is unremarkable. IMPRESSION: Stable mild interstitial densities are noted throughout both lungs which may represent scarring, but acute superimposed edema or inflammation cannot be excluded. Electronically Signed   By: Marijo Conception M.D.   On: 06/15/2019 11:53   ECHOCARDIOGRAM COMPLETE  Result Date: 06/16/2019    ECHOCARDIOGRAM REPORT   Patient Name:   Desiree Koch Date of Exam: 06/16/2019 Medical Rec #:  [id number]       Height:       61.0 in Accession #:    [id number]      Weight:       153.0 lb Date of Birth:  11-15-1930      BSA:          1.686 m Patient Age:    31 years        BP:           108/70 mmHg Patient Gender: F               HR:           99 bpm. Exam Location:  [facility] Procedure: 2D Echo, Color Doppler and Cardiac Doppler Indications:     CHF- acute diastolic [id number]  History:         Patient has prior history of Echocardiogram examinations, most                  recent 06/17/2017. CAD; Risk Factors:Hypertension. Pneumonia.  Sonographer:     Sherrie Sport RDCS (AE) Referring Phys:  [id number] Collier Bullock Diagnosing Phys: Yolonda Kida MD IMPRESSIONS  1. Left ventricular ejection fraction, by estimation, is 20 to 25%. The left ventricle has severely decreased function. The left  ventricle demonstrates regional wall motion abnormalities (see scoring diagram/findings for description). The left ventricular internal cavity size was mildly to moderately dilated. Left ventricular diastolic parameters were normal.  2. Right ventricular systolic function is mildly reduced. The right ventricular size is mildly enlarged. There is mildly elevated pulmonary artery systolic pressure.  3. Left atrial size was moderately dilated.  4. Right atrial size was moderately dilated.  5. The mitral valve is grossly normal. Moderate mitral valve regurgitation.  6. Tricuspid valve regurgitation is moderate.  7.  The aortic valve is grossly normal. Aortic valve regurgitation is not visualized. FINDINGS  Left Ventricle: Left ventricular ejection fraction, by estimation, is 20 to 25%. The left ventricle has severely decreased function. The left ventricle demonstrates regional wall motion abnormalities. The left ventricular internal cavity size was mildly  to moderately dilated. There is no left ventricular hypertrophy. Abnormal (paradoxical) septal motion, consistent with left bundle branch block. Left ventricular diastolic parameters were normal. Right Ventricle: The right ventricular size is mildly enlarged. No increase in right ventricular wall thickness. Right ventricular systolic function is mildly reduced. There is mildly elevated pulmonary artery systolic pressure. The tricuspid regurgitant  velocity is 2.61 m/s, and with an assumed right atrial pressure of 10 mmHg, the estimated right ventricular systolic pressure is 0000000 mmHg. Left Atrium: Left atrial size was moderately dilated. Right Atrium: Right atrial size was moderately dilated. Pericardium: There is no evidence of pericardial effusion. Mitral Valve: The mitral valve is grossly normal. Moderate mitral valve regurgitation. Tricuspid Valve: The tricuspid valve is grossly normal. Tricuspid valve regurgitation is moderate. Aortic Valve: The aortic valve is  grossly normal. Aortic valve regurgitation is not visualized. Aortic valve mean gradient measures 2.2 mmHg. Aortic valve peak gradient measures 3.6 mmHg. Aortic valve area, by VTI measures 1.92 cm. Pulmonic Valve: The pulmonic valve was normal in structure. Pulmonic valve regurgitation is not visualized. Aorta: The aortic root is normal in size and structure. IAS/Shunts: No atrial level shunt detected by color flow Doppler.  LEFT VENTRICLE PLAX 2D LVIDd:         3.62 cm LVIDs:         3.33 cm LV PW:         1.16 cm LV IVS:        1.33 cm LVOT diam:     2.00 cm LV SV:         28 LV SV Index:   17 LVOT Area:     3.14 cm  LV Volumes (MOD) LV vol d, MOD A2C: 71.3 ml LV vol d, MOD A4C: 74.8 ml LV vol s, MOD A2C: 47.9 ml LV vol s, MOD A4C: 58.6 ml LV SV MOD A2C:     23.4 ml LV SV MOD A4C:     74.8 ml LV SV MOD BP:      20.8 ml RIGHT VENTRICLE RV S prime:     12.60 cm/s TAPSE (M-mode): 2.7 cm LEFT ATRIUM             Index       RIGHT ATRIUM           Index LA diam:        4.90 cm 2.91 cm/m  RA Area:     19.20 cm LA Vol (A2C):   72.8 ml 43.19 ml/m RA Volume:   51.30 ml  30.44 ml/m LA Vol (A4C):   81.9 ml 48.59 ml/m LA Biplane Vol: 83.1 ml 49.30 ml/m  AORTIC VALVE                   PULMONIC VALVE AV Area (Vmax):    1.81 cm    PV Vmax:        0.49 m/s AV Area (Vmean):   1.65 cm    PV Peak grad:   1.0 mmHg AV Area (VTI):     1.92 cm    RVOT Peak grad: 2 mmHg AV Vmax:           95.05 cm/s AV Vmean:  69.900 cm/s AV VTI:            0.147 m AV Peak Grad:      3.6 mmHg AV Mean Grad:      2.2 mmHg LVOT Vmax:         54.70 cm/s LVOT Vmean:        36.800 cm/s LVOT VTI:          0.090 m LVOT/AV VTI ratio: 0.61  AORTA Ao Root diam: 2.50 cm MITRAL VALVE               TRICUSPID VALVE MV Area (PHT): 5.13 cm    TR Peak grad:   27.2 mmHg MV Decel Time: 148 msec    TR Vmax:        261.00 cm/s MV E velocity: 92.20 cm/s                            SHUNTS                            Systemic VTI:  0.09 m                             Systemic Diam: 2.00 cm Dwayne Prince [place] MD Electronically signed by Yolonda Kida MD Signature Date/Time: 06/16/2019/1:26:31 PM    Final    [place] ASCITES (ABDOMEN LIMITED)  Result Date: 06/15/2019 CLINICAL DATA:  Abdominal distension for 1 month EXAM: LIMITED ABDOMEN ULTRASOUND FOR ASCITES TECHNIQUE: Limited ultrasound survey for ascites was performed in all four abdominal quadrants. COMPARISON:  07/29/2018 FINDINGS: Scattered ascites identified in all 4 quadrants, greater in lower quadrants. RIGHT pleural effusion also identified. IMPRESSION: Ascites throughout abdomen as well as RIGHT pleural effusion. Electronically Signed   By: Lavonia Dana M.D.   On: 06/15/2019 15:19     Medications:   . sodium chloride    . albumin human 25 g (06/17/19 0924)   . cholecalciferol  2,000 Units Oral Daily  . digoxin  0.0625 mg Oral Daily  . furosemide  40 mg Intravenous Q12H  . heparin  5,000 Units Subcutaneous Q8H  . levothyroxine  75 mcg Oral Daily  . loratadine  10 mg Oral Daily  . metoprolol succinate  100 mg Oral BID  . multivitamin-lutein  1 capsule Oral Daily  . potassium chloride SA  20 mEq Oral Daily  . sodium chloride flush  3 mL Intravenous Q12H   sodium chloride, acetaminophen, acetaminophen, fluticasone, ondansetron (ZOFRAN) IV, sodium chloride flush  Assessment/ Plan:  86 y.o. female with  systolic congestive heart failure, atrial fibrillation, hypertension, hypothyroidism, hepatic cirrhosis, coronary artery disease   admitted on 06/15/2019 for  Abdominal distention [R14.0] Anasarca [R60.1] CHF (congestive heart failure), NYHA class I, acute on chronic, systolic (HCC) [id number] Acute on chronic congestive heart failure, unspecified heart failure type ([place]) [I50.9]   1. Acute renal failure on chronic kidney disease stage IIIB: baseline creatinine of 1.4, GFR of 36 on 04/13/2019.  No IV contrast exposure Chronic Kidney Disease most likely due to hypertensive nephrosclerosis Renal  U.S neg for obstruction. Stable exophytic cysts Serum creatinine starting to improve.  Will monitor closely  2. Acute exacerbation of ch sys CHF Currently on furosemide 40 mg IV every 12 hours Monitor volume status closely.  Avoid hypotension  3. Cirrhosis with ascites 3000 cc removed with paracentesis 3/19  IV furosemide Albumin 25 g every 12 hours Avoid hypotension We will add midodrine  4.  Hypokalemia Likely induced by diuresis Potassium supplementation as needed    LOS: 2 Desiree Koch Desiree Koch 3/20/202111:16 AM  [place], [place]  Note: This note was prepared with Dragon dictation. Any transcription errors are unintentional

## 2019-06-17 NOTE — Plan of Care (Signed)
  Problem: Education: Goal: Knowledge of General Education information will improve Description Including pain rating scale, medication(s)/side effects and non-pharmacologic comfort measures Outcome: Progressing   Problem: Clinical Measurements: Goal: Respiratory complications will improve Outcome: Progressing   Problem: Education: Goal: Ability to demonstrate management of disease process will improve Outcome: Progressing   Problem: Activity: Goal: Capacity to carry out activities will improve Outcome: Progressing   

## 2019-06-17 NOTE — Progress Notes (Signed)
PROGRESS NOTE    Desiree Koch  [id number] DOB: 12-01-1930 DOA: 06/15/2019 PCP: Adin Hector, MD    Assessment & Plan:   Principal Problem:   Acute on chronic systolic CHF (congestive heart failure) ([place]) Active Problems:   AKI (acute kidney injury) ([place])   AF (paroxysmal atrial fibrillation) (H[facility])   Hypothyroidism   CAD (coronary artery disease)   CHF (congestive heart failure), NYHA class I, acute on chronic, systolic (H[facility])    Desiree Koch is a 86 y.o. female with medical history significant for atrial fibrillation not on anticoagulation due to history of bleeding, chronic systolic heart failure with last known LVEF of 20%, hypothyroidism, hypertension, liver cirrhosis and coronary artery disease.  Patient was brought to the emergency room by her daughter for evaluation of worsening lower extremity swelling and increasing abdominal girth.   Acute on chronic systolic CHF, LVEF 123456 --shortness of breath with exertion, 2 pillow orthopnea and bilateral lower extremity swelling, 10 pound weight gain.  Home regimen of torsemide 40mg  daily, reportedly not producing much urine with this. Patient's last 2D Echocardiogram showed an LVEF of 20%.  Repeat similar. PLAN: --Lasix 40 mg IV every 12, per nephrology --Maintain low sodium diet and fluid restriction --Strict I/O and Daily weights --Continue metoprolol --cardiology consult  Paroxysmal atrial fibrillation Patient is not on long-term anticoagulation due to history of bleeding --Continue metoprolol for rate control --continue digoxin, per cards  Acute renal failure on chronic kidney disease stage IIIB --Cr 2.38 on presentation.  baseline creatinine of 1.4, GFR of 36 on 04/13/2019. --Renal U.S neg for obstruction. Stable exophytic cysts. --Nephrology consulted PLAN: - CKD work up: SPEP/UPEP, hepatitis panel, urine studies, per nephrology - Not on an ARB. Has history of ACE-I due to cough.    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  Never had paracentesis before.  No abdominal pain, fever, or leukocytosis to suggest SBP. --Paracentesis 3/19 with 3L removed (a lot more left, but want to avoid causing hepatorenal syndrome).   PLAN: --IV albumin --continue IV lasix, per nephrology --start midodrine --Will likely need another paracentesis during this admission  Hypokalemia Likely induced by diuresis Potassium supplementation as needed  Anemia with chronic kidney disease: macrocytic - iron studies and anemia labs all wnl.  Hypothyroidism Continue Synthroid  History of coronary artery disease Stable Continue aspirin and beta-blockers   DVT prophylaxis: Heparin SQ Code Status: Full code  Family Communication: updated daughter and son at the bedside today Disposition Plan: from home, like back home with [facility], unclear when, still on IV diuresis and need further gentle paracentesis.  Daughter requests resources with in-home help from [facility].   Subjective and Interval History:  Pt reported breathing improved today.  Did produce urine.  Ate.  No fever, chest pain, abdominal pain, N/V/D, dysuria.    Objective: Vitals:   06/17/19 0914 06/17/19 0955 06/17/19 1140 06/17/19 1656  BP: 118/74  99/66 (!) 112/58  Pulse: (!) 118  (!) 117 86  Resp: 16 (!) 22 20 16   Temp: 97.8 F (36.6 C)  98 F (36.7 C) 98.3 F (36.8 C)  TempSrc: Oral   Oral  SpO2: 99%  99% 99%  Weight:      Height:        Intake/Output Summary (Last 24 hours) at 06/17/2019 1752 Last data filed at 06/17/2019 0758 Gross per 24 hour  Intake 340 ml  Output 1050 ml  Net -710 ml   Filed Weights   06/15/19  2016 06/16/19 0240 06/17/19 0225  Weight: 69.5 kg 69.4 kg 66.7 kg    Examination:   Constitutional: NAD, AAOx3 HEENT: conjunctivae and lids normal, EOMI CV: RRR no M,R,G. Distal pulses +2.  No cyanosis.   RESP: CTA B/L, normal respiratory effort  GI: +BS, NT, large  abdomen with fluid wave Extremities: edema in BLE improved SKIN: warm, dry and intact Neuro: II - XII grossly intact.  Sensation intact Psych: Normal mood and affect.  Appropriate judgement and reason   Data Reviewed: I have personally reviewed following labs and imaging studies  CBC: Recent Labs  Lab 06/15/19 1055 06/17/19 0450  WBC 7.3 6.0  HGB 11.4* 8.7*  HCT 32.9* 26.0*  MCV 102.2* 104.8*  PLT 159 [id number]*   Basic Metabolic Panel: Recent Labs  Lab 06/15/19 1055 06/16/19 0553 06/17/19 0450  NA 134* 135 137  K 3.7 4.1 3.2*  CL 94* 95* 98  CO2 27 29 29   GLUCOSE 102* 98 83  BUN 35* 38* 35*  CREATININE 2.38* 2.19* 1.95*  CALCIUM 9.4 8.9 8.7*  MG  --   --  1.8   GFR: Estimated Creatinine Clearance: 18.1 mL/min (A) (by C-G formula based on SCr of 1.95 mg/dL (H)). Liver Function Tests: Recent Labs  Lab 06/15/19 1055  AST 54*  ALT 14  ALKPHOS 79  BILITOT 2.7*  PROT 6.2*  ALBUMIN 2.5*   No results for input(s): LIPASE, AMYLASE in the last 168 hours. No results for input(s): AMMONIA in the last 168 hours. Coagulation Profile: Recent Labs  Lab 06/15/19 1305  INR 1.2   Cardiac Enzymes: No results for input(s): CKTOTAL, CKMB, CKMBINDEX, TROPONINI in the last 168 hours. BNP (last 3 results) No results for input(s): PROBNP in the last 8760 hours. HbA1C: No results for input(s): HGBA1C in the last 72 hours. CBG: No results for input(s): GLUCAP in the last 168 hours. Lipid Profile: No results for input(s): CHOL, HDL, LDLCALC, TRIG, CHOLHDL, LDLDIRECT in the last 72 hours. Thyroid Function Tests: Recent Labs    06/15/19 1305  TSH 2.979   Anemia Panel: Recent Labs    06/16/19 1711  VITAMINB12 1,689*  FOLATE 22.0  FERRITIN 238  TIBC 200*  IRON 61   Sepsis Labs: No results for input(s): PROCALCITON, LATICACIDVEN in the last 168 hours.  Recent Results (from the past 240 hour(s))  Respiratory Panel by RT PCR (Flu A&B, Covid) - Nasopharyngeal Swab      Status: None   Collection Time: 06/15/19  1:05 PM   Specimen: Nasopharyngeal Swab  Result Value Ref Range Status   SARS Coronavirus 2 by RT PCR NEGATIVE NEGATIVE Final    Comment: (NOTE) SARS-CoV-2 target nucleic acids are NOT DETECTED. The SARS-CoV-2 RNA is generally detectable in upper respiratoy specimens during the acute phase of infection. The lowest concentration of SARS-CoV-2 viral copies this assay can detect is 131 copies/mL. A negative result does not preclude SARS-Cov-2 infection and should not be used as the sole basis for treatment or other patient management decisions. A negative result may occur with  improper specimen collection/handling, submission of specimen other than nasopharyngeal swab, presence of viral mutation(s) within the areas targeted by this assay, and inadequate number of viral copies (<131 copies/mL). A negative result must be combined with clinical observations, patient history, and epidemiological information. The expected result is Negative. Fact Sheet for Patients:  PinkCheek.be Fact Sheet for Healthcare Providers:  GravelBags.it This test is not yet ap proved or cleared by the [place]  FDA and  has been authorized for detection and/or diagnosis of SARS-CoV-2 by FDA under an Emergency Use Authorization (EUA). This EUA will remain  in effect (meaning this test can be used) for the duration of the COVID-19 declaration under Section 564(b)(1) of the Act, 21 U.S.C. section 360bbb-3(b)(1), unless the authorization is terminated or revoked sooner.    Influenza A by PCR NEGATIVE NEGATIVE Final   Influenza B by PCR NEGATIVE NEGATIVE Final    Comment: (NOTE) The Xpert Xpress SARS-CoV-2/FLU/RSV assay is intended as an aid in  the diagnosis of influenza from Nasopharyngeal swab specimens and  should not be used as a sole basis for treatment. Nasal washings and  aspirates are unacceptable for  Xpert Xpress SARS-CoV-2/FLU/RSV  testing. Fact Sheet for Patients: PinkCheek.be Fact Sheet for Healthcare Providers: GravelBags.it This test is not yet approved or cleared by the [place] FDA and  has been authorized for detection and/or diagnosis of SARS-CoV-2 by  FDA under an Emergency Use Authorization (EUA). This EUA will remain  in effect (meaning this test can be used) for the duration of the  Covid-19 declaration under Section 564(b)(1) of the Act, 21  U.S.C. section 360bbb-3(b)(1), unless the authorization is  terminated or revoked. Performed at [facility], [address] [place], [place] [address]   Body fluid culture     Status: None (Preliminary result)   Collection Time: 06/16/19  2:42 PM   Specimen: PATH Cytology Peritoneal fluid  Result Value Ref Range Status   Specimen Description   Final    PERITONEAL Performed at [facility], [address], [place], [place] [address]    Special Requests   Final    NONE Performed at [facility], [address] [place], [place] [address]    Gram Stain   Final    FEW WBC PRESENT, PREDOMINANTLY MONONUCLEAR NO ORGANISMS SEEN    Culture   Final    NO GROWTH < 12 HOURS Performed at [facility], [place] [address], [place],  [address]    Report Status PENDING  Incomplete      Radiology Studies: US RENAL  Result Date: 06/16/2019 CLINICAL DATA:  Acute kidney failure.  Chronic kidney disease. EXAM: RENAL / URINARY TRACT ULTRASOUND COMPLETE COMPARISON:  07/29/2018, ultrasound 02/22/2017 FINDINGS: Right Kidney: Renal measurements: 9.4 x 4.4 x 4.2 centimeters = volume: 90 mL. On previous ultrasound 02/22/2017, the RIGHT kidney measured 10.1 centimeters. Two small exophytic lesions are identified in the midpole region, measuring 0.9 and 1.2 centimeters. These lesions likely represent cysts given stability since 2018. Left Kidney:  Renal measurements: 8.4 x 4.7 x 4.1 centimeters = volume: 86 mL. On previous ultrasound 02/22/2017, the LEFT kidney measured 10.0 centimeters. Echogenicity within normal limits. No mass or hydronephrosis visualized. Bladder: Decompressed Other: None. IMPRESSION: Kidneys are smaller compared to prior studies.  No hydronephrosis. Stable exophytic RIGHT renal cysts. Electronically Signed   By: Nolon Nations M.D.   On: 06/16/2019 14:45   US Paracentesis  Result Date: 06/16/2019 INDICATION: Acute renal failure with ascites. EXAM: ULTRASOUND GUIDED  PARACENTESIS MEDICATIONS: None. COMPLICATIONS: None immediate. PROCEDURE: Informed written consent was obtained from the patient after a discussion of the risks, benefits and alternatives to treatment. A timeout was performed prior to the initiation of the procedure. Initial ultrasound scanning demonstrates a large amount of ascites within the left lower abdominal quadrant. The left lower abdomen was prepped and draped in the usual sterile fashion. 1% lidocaine was used for  local anesthesia. Following this, a 6 Fr Safe-T-Centesis catheter was introduced. An ultrasound image was saved for documentation purposes. The paracentesis was performed. The catheter was removed and a dressing was applied. The patient tolerated the procedure well without immediate post procedural complication. FINDINGS: A total of approximately 3 L of pink colored fluid was removed. Samples were sent to the laboratory as requested by the clinical team. Max volume to be removed was 3 L and there was residual fluid after the paracentesis. IMPRESSION: Successful ultrasound-guided paracentesis yielding 3 liters of peritoneal fluid. Electronically Signed   By: Markus Daft M.D.   On: 06/16/2019 14:51   ECHOCARDIOGRAM COMPLETE  Result Date: 06/16/2019    ECHOCARDIOGRAM REPORT   Patient Name:   NOTNAMED PAYNE Date of Exam: 06/16/2019 Medical Rec #:  [id number]       Height:       61.0 in Accession #:     [id number]      Weight:       153.0 lb Date of Birth:  [birth year]      BSA:          1.686 m Patient Age:    26 years        BP:           108/70 mmHg Patient Gender: F               HR:           99 bpm. Exam Location:  [facility] Procedure: 2D Echo, Color Doppler and Cardiac Doppler Indications:     CHF- acute diastolic [id number]  History:         Patient has prior history of Echocardiogram examinations, most                  recent 06/17/2017. CAD; Risk Factors:Hypertension. Pneumonia.  Sonographer:     Sherrie Sport RDCS (AE) Referring Phys:  [id number] Collier Bullock Diagnosing Phys: Yolonda Kida MD IMPRESSIONS  1. Left ventricular ejection fraction, by estimation, is 20 to 25%. The left ventricle has severely decreased function. The left ventricle demonstrates regional wall motion abnormalities (see scoring diagram/findings for description). The left ventricular internal cavity size was mildly to moderately dilated. Left ventricular diastolic parameters were normal.  2. Right ventricular systolic function is mildly reduced. The right ventricular size is mildly enlarged. There is mildly elevated pulmonary artery systolic pressure.  3. Left atrial size was moderately dilated.  4. Right atrial size was moderately dilated.  5. The mitral valve is grossly normal. Moderate mitral valve regurgitation.  6. Tricuspid valve regurgitation is moderate.  7. The aortic valve is grossly normal. Aortic valve regurgitation is not visualized. FINDINGS  Left Ventricle: Left ventricular ejection fraction, by estimation, is 20 to 25%. The left ventricle has severely decreased function. The left ventricle demonstrates regional wall motion abnormalities. The left ventricular internal cavity size was mildly  to moderately dilated. There is no left ventricular hypertrophy. Abnormal (paradoxical) septal motion, consistent with left bundle branch block. Left ventricular diastolic parameters were normal. Right Ventricle: The right ventricular size  is mildly enlarged. No increase in right ventricular wall thickness. Right ventricular systolic function is mildly reduced. There is mildly elevated pulmonary artery systolic pressure. The tricuspid regurgitant  velocity is 2.61 m/s, and with an assumed right atrial pressure of 10 mmHg, the estimated right ventricular systolic pressure is 0000000 mmHg. Left Atrium: Left atrial size was moderately dilated. Right Atrium: Right atrial size was moderately dilated. Pericardium:  There is no evidence of pericardial effusion. Mitral Valve: The mitral valve is grossly normal. Moderate mitral valve regurgitation. Tricuspid Valve: The tricuspid valve is grossly normal. Tricuspid valve regurgitation is moderate. Aortic Valve: The aortic valve is grossly normal. Aortic valve regurgitation is not visualized. Aortic valve mean gradient measures 2.2 mmHg. Aortic valve peak gradient measures 3.6 mmHg. Aortic valve area, by VTI measures 1.92 cm. Pulmonic Valve: The pulmonic valve was normal in structure. Pulmonic valve regurgitation is not visualized. Aorta: The aortic root is normal in size and structure. IAS/Shunts: No atrial level shunt detected by color flow Doppler.  LEFT VENTRICLE PLAX 2D LVIDd:         3.62 cm LVIDs:         3.33 cm LV PW:         1.16 cm LV IVS:        1.33 cm LVOT diam:     2.00 cm LV SV:         28 LV SV Index:   17 LVOT Area:     3.14 cm  LV Volumes (MOD) LV vol d, MOD A2C: 71.3 ml LV vol d, MOD A4C: 74.8 ml LV vol s, MOD A2C: 47.9 ml LV vol s, MOD A4C: 58.6 ml LV SV MOD A2C:     23.4 ml LV SV MOD A4C:     74.8 ml LV SV MOD BP:      20.8 ml RIGHT VENTRICLE RV S prime:     12.60 cm/s TAPSE (M-mode): 2.7 cm LEFT ATRIUM             Index       RIGHT ATRIUM           Index LA diam:        4.90 cm 2.91 cm/m  RA Area:     19.20 cm LA Vol (A2C):   72.8 ml 43.19 ml/m RA Volume:   51.30 ml  30.44 ml/m LA Vol (A4C):   81.9 ml 48.59 ml/m LA Biplane Vol: 83.1 ml 49.30 ml/m  AORTIC VALVE                   PULMONIC  VALVE AV Area (Vmax):    1.81 cm    PV Vmax:        0.49 m/s AV Area (Vmean):   1.65 cm    PV Peak grad:   1.0 mmHg AV Area (VTI):     1.92 cm    RVOT Peak grad: 2 mmHg AV Vmax:           95.05 cm/s AV Vmean:          69.900 cm/s AV VTI:            0.147 m AV Peak Grad:      3.6 mmHg AV Mean Grad:      2.2 mmHg LVOT Vmax:         54.70 cm/s LVOT Vmean:        36.800 cm/s LVOT VTI:          0.090 m LVOT/AV VTI ratio: 0.61  AORTA Ao Root diam: 2.50 cm MITRAL VALVE               TRICUSPID VALVE MV Area (PHT): 5.13 cm    TR Peak grad:   27.2 mmHg MV Decel Time: 148 msec    TR Vmax:        261.00 cm/s MV E velocity: 92.20 cm/s  SHUNTS                            Systemic VTI:  0.09 m                            Systemic Diam: 2.00 cm Yolonda Kida MD Electronically signed by Yolonda Kida MD Signature Date/Time: 06/16/2019/1:26:31 PM    Final      Scheduled Meds:  cholecalciferol  2,000 Units Oral Daily   digoxin  0.0625 mg Oral Daily   feeding supplement (ENSURE ENLIVE)  237 mL Oral BID BM   furosemide  40 mg Intravenous Q12H   heparin  5,000 Units Subcutaneous Q8H   levothyroxine  75 mcg Oral Daily   loratadine  10 mg Oral Daily   metoprolol succinate  100 mg Oral BID   [START ON 06/18/2019] midodrine  5 mg Oral TID WC   multivitamin-lutein  1 capsule Oral Daily   potassium chloride SA  20 mEq Oral Daily   sodium chloride flush  3 mL Intravenous Q12H   Continuous Infusions:  sodium chloride     albumin human 25 g (06/17/19 0924)     LOS: 2 days     Enzo Bi, MD [facility] If 7PM-7AM, please contact night-coverage 06/17/2019, 5:52 PM

## 2019-06-18 DIAGNOSIS — K7469 Other cirrhosis of liver: Secondary | ICD-10-CM | POA: Diagnosis present

## 2019-06-18 DIAGNOSIS — D649 Anemia, unspecified: Secondary | ICD-10-CM | POA: Diagnosis present

## 2019-06-18 DIAGNOSIS — R188 Other ascites: Secondary | ICD-10-CM | POA: Diagnosis present

## 2019-06-18 LAB — BASIC METABOLIC PANEL
Anion gap: 10 (ref 5–15)
BUN: 33 mg/dL — ABNORMAL HIGH (ref 8–23)
CO2: 29 mmol/L (ref 22–32)
Calcium: 8.9 mg/dL (ref 8.9–10.3)
Chloride: 99 mmol/L (ref 98–111)
Creatinine, Ser: 1.83 mg/dL — ABNORMAL HIGH (ref 0.44–1.00)
GFR calc Af Amer: 28 mL/min — ABNORMAL LOW (ref >60)
GFR calc non Af Amer: 25 mL/min — ABNORMAL LOW (ref >60)
Glucose, Bld: 97 mg/dL (ref 70–99)
Potassium: 3.8 mmol/L (ref 3.5–5.1)
Sodium: 138 mmol/L (ref 135–145)

## 2019-06-18 LAB — CBC
HCT: 27.2 % — ABNORMAL LOW (ref 36.0–46.0)
Hemoglobin: 9.1 g/dL — ABNORMAL LOW (ref 12.0–15.0)
MCH: 35 pg — ABNORMAL HIGH (ref 26.0–34.0)
MCHC: 33.5 g/dL (ref 30.0–36.0)
MCV: 104.6 fL — ABNORMAL HIGH (ref 80.0–100.0)
Platelets: 121 10*3/uL — ABNORMAL LOW (ref 150–400)
RBC: 2.6 MIL/uL — ABNORMAL LOW (ref 3.87–5.11)
RDW: 16.5 % — ABNORMAL HIGH (ref 11.5–15.5)
WBC: 6.9 10*3/uL (ref 4.0–10.5)
nRBC: 0 % (ref 0.0–0.2)

## 2019-06-18 LAB — MAGNESIUM: Magnesium: 1.7 mg/dL (ref 1.7–2.4)

## 2019-06-18 LAB — PARATHYROID HORMONE, INTACT (NO CA): PTH: 42 pg/mL (ref 15–65)

## 2019-06-18 MED ORDER — FUROSEMIDE 10 MG/ML IJ SOLN
40.0000 mg | Freq: Two times a day (BID) | INTRAMUSCULAR | Status: DC
  Administered 2019-06-18 – 2019-06-19 (×3): 40 mg via INTRAVENOUS
  Filled 2019-06-18 (×3): qty 4

## 2019-06-18 MED ORDER — ALBUMIN HUMAN 25 % IV SOLN
25.0000 g | Freq: Two times a day (BID) | INTRAVENOUS | Status: DC
  Administered 2019-06-18 – 2019-06-19 (×2): 25 g via INTRAVENOUS
  Filled 2019-06-18 (×3): qty 100

## 2019-06-18 NOTE — Evaluation (Signed)
Physical Therapy Evaluation Patient Details Name: Desiree Koch MRN: [id number] DOB: 01/03/1931 Today's Date: 06/18/2019   History of Present Illness  Patient is an 86 y/o F that presents with increased LE swelling, has been managed with a paracentesis on this admission.  Clinical Impression  Patient is an 86 y/o F that presents from home. She has had progressive LE swelling over the past week leading to her admission. She is typically independent with a SPC at home and is still driving. She shows good insight throughout this session and is able to perform bed mobility, transfers, and ambulation at roughly anticipated baseline levels. She did have increase in HR and SpO2 demand throughout session indicative of deconditioning. She would likely benefit from skilled PT services to improve her endurance to perform ADLs at home.     Follow Up Recommendations Home health PT    Equipment Recommendations  Rolling walker with 5" wheels    Recommendations for Other Services       Precautions / Restrictions Precautions Precautions: Fall Restrictions Weight Bearing Restrictions: No      Mobility  Bed Mobility Overal bed mobility: Needs Assistance Bed Mobility: Supine to Sit     Supine to sit: Min guard     General bed mobility comments: Patient able to transfer relatively independently with slight assist needed for trunk elevation.  Transfers Overall transfer level: Needs assistance Equipment used: Rolling walker (2 wheeled) Transfers: Sit to/from Stand Sit to Stand: Supervision         General transfer comment: Patient able to complete transfer with RW with no hesitation or loss of balance.  Ambulation/Gait Ambulation/Gait assistance: Supervision Gait Distance (Feet): 400 Feet Assistive device: Rolling walker (2 wheeled) Gait Pattern/deviations: WFL(Within Functional Limits)   Gait velocity interpretation: 1.31 - 2.62 ft/sec, indicative of limited community  ambulator General Gait Details: Patient able to ambulate with no loss of balance, HR to 111 and SpO2 to 94%  Stairs            Wheelchair Mobility    Modified Rankin (Stroke Patients Only)       Balance Overall balance assessment: Modified Independent                                           Pertinent Vitals/Pain Pain Assessment: No/denies pain    Home Living Family/patient expects to be discharged to:: Private residence Living Arrangements: Alone;Other (Comment)(Family planning to bring in [facility] during weekdays.) Available Help at Discharge: Family;Home health;Available PRN/intermittently Type of Home: House Home Access: Stairs to enter Entrance Stairs-Rails: Right Entrance Stairs-Number of Steps: 2 Home Layout: One level Home Equipment: Walker - 2 wheels;Cane - single point      Prior Function Level of Independence: Independent         Comments: Per daughter, patient drives and ambulates around the house with [facility]. She had been doing her own cooking.     Hand Dominance        Extremity/Trunk Assessment   Upper Extremity Assessment Upper Extremity Assessment: Overall WFL for tasks assessed    Lower Extremity Assessment Lower Extremity Assessment: Overall WFL for tasks assessed       Communication   Communication: No difficulties  Cognition Arousal/Alertness: Awake/alert Behavior During Therapy: WFL for tasks assessed/performed Overall Cognitive Status: Within Functional Limits for tasks assessed  General Comments General comments (skin integrity, edema, etc.): Excellent dynamic balance observed in gait speed and transfers.    Exercises     Assessment/Plan    PT Assessment Patient needs continued PT services  PT Problem List Decreased strength;Decreased mobility;Cardiopulmonary status limiting activity       PT Treatment Interventions DME instruction;Therapeutic  exercise;Balance training;Gait training;Stair training;Neuromuscular re-education;Therapeutic activities;Patient/family education    PT Goals (Current goals can be found in the Care Plan section)  Acute Rehab PT Goals Patient Stated Goal: To return home safely PT Goal Formulation: With patient Time For Goal Achievement: 07/02/19 Potential to Achieve Goals: Good    Frequency Min 2X/week   Barriers to discharge Decreased caregiver support Patient lives alone.    Co-evaluation               AM-PAC PT "6 Clicks" Mobility  Outcome Measure Help needed turning from your back to your side while in a flat bed without using bedrails?: A Little Help needed moving from lying on your back to sitting on the side of a flat bed without using bedrails?: None Help needed moving to and from a bed to a chair (including a wheelchair)?: None Help needed standing up from a chair using your arms (e.g., wheelchair or bedside chair)?: None Help needed to walk in hospital room?: None Help needed climbing 3-5 steps with a railing? : A Little 6 Click Score: 22    End of Session Equipment Utilized During Treatment: Gait belt Activity Tolerance: Patient tolerated treatment well Patient left: in chair;Other (comment)(No alarm box present, alerted RN staff and instructed patient not to get up without assistance.) Nurse Communication: Mobility status PT Visit Diagnosis: Difficulty in walking, not elsewhere classified (R26.2)    Time: [id number] PT Time Calculation (min) (ACUTE ONLY): 17 min   Charges:   PT Evaluation $PT Eval Moderate Complexity: 1 Mod        Royce Macadamia PT, DPT, CSCS    06/18/2019, 12:58 PM

## 2019-06-18 NOTE — Progress Notes (Signed)
[place], [place] 06/18/19  Subjective:   Hospital day # 3 Patient underwent paracentesis on Friday when 3 L of fluid was removed Abdomen is soft. Continues to have lower extremity edema Feels like she is voiding more frequently Serum creatinine trend is improving slowly Her daughter is in the room with her   Renal: 03/20 0701 - 03/21 0700 In: 183.2 [IV Piggyback:183.2] Out: 1400 [Urine:1400] Lab Results  Component Value Date   CREATININE 1.83 (H) 06/18/2019   CREATININE 1.95 (H) 06/17/2019   CREATININE 2.19 (H) 06/16/2019     Objective:  Vital signs in last 24 hours:  Temp:  [98 F (36.7 C)-98.5 F (36.9 C)] 98 F (36.7 C) (03/21 0741) Pulse Rate:  [68-107] 81 (03/21 0741) Resp:  [15-16] 16 (03/21 0741) BP: (101-121)/(57-73) 121/73 (03/21 0741) SpO2:  [95 %-99 %] 98 % (03/21 0741) Weight:  [66.7 kg] 66.7 kg (03/21 0343)  Weight change: 0 kg Filed Weights   06/16/19 0240 06/17/19 0225 06/18/19 0343  Weight: 69.4 kg 66.7 kg 66.7 kg    Intake/Output:    Intake/Output Summary (Last 24 hours) at 06/18/2019 1219 Last data filed at 06/18/2019 1120 Gross per 24 hour  Intake 423.2 ml  Output 1800 ml  Net -1376.8 ml     Physical Exam: General:  No acute distress, laying in the  HEENT  head anicteric, moist oral mucous membrane  Pulm/lungs  normal breathing effort, clear to auscultate  CVS/Heart  no rub or gallop, irregular rhythm  Abdomen:   Soft, distended, ascites  Extremities:  2+ lower extremity edema  Neurologic:  Alert, oriented  Skin:  No acute rashes    Basic Metabolic Panel:  Recent Labs  Lab 06/15/19 1055 06/15/19 1055 06/16/19 0553 06/17/19 0450 06/18/19 0627  NA 134*  --  135 137 138  K 3.7  --  4.1 3.2* 3.8  CL 94*  --  95* 98 99  CO2 27  --  29 29 29   GLUCOSE 102*  --  98 83 97  BUN 35*  --  38* 35* 33*  CREATININE 2.38*  --  2.19* 1.95* 1.83*  CALCIUM 9.4   < > 8.9 8.7* 8.9  MG  --   --   --  1.8 1.7    < > = values in this interval not displayed.     CBC: Recent Labs  Lab 06/15/19 1055 06/17/19 0450 06/18/19 0627  WBC 7.3 6.0 6.9  HGB 11.4* 8.7* 9.1*  HCT 32.9* 26.0* 27.2*  MCV 102.2* 104.8* 104.6*  PLT 159 117* 121*      Lab Results  Component Value Date   HEPBSAG NON REACTIVE 06/17/2019   HEPBSAB NON REACTIVE 06/17/2019   HEPBIGM NON REACTIVE 06/17/2019      Microbiology:  Recent Results (from the past 240 hour(s))  Respiratory Panel by RT PCR (Flu A&B, Covid) - Nasopharyngeal Swab     Status: None   Collection Time: 06/15/19  1:05 PM   Specimen: Nasopharyngeal Swab  Result Value Ref Range Status   SARS Coronavirus 2 by RT PCR NEGATIVE NEGATIVE Final    Comment: (NOTE) SARS-CoV-2 target nucleic acids are NOT DETECTED. The SARS-CoV-2 RNA is generally detectable in upper respiratoy specimens during the acute phase of infection. The lowest concentration of SARS-CoV-2 viral copies this assay can detect is 131 copies/mL. A negative result does not preclude SARS-Cov-2 infection and should not be used as the sole basis for treatment or other patient management  decisions. A negative result may occur with  improper specimen collection/handling, submission of specimen other than nasopharyngeal swab, presence of viral mutation(s) within the areas targeted by this assay, and inadequate number of viral copies (<131 copies/mL). A negative result must be combined with clinical observations, patient history, and epidemiological information. The expected result is Negative. Fact Sheet for Patients:  PinkCheek.be Fact Sheet for Healthcare Providers:  GravelBags.it This test is not yet ap proved or cleared by the [place] FDA and  has been authorized for detection and/or diagnosis of SARS-CoV-2 by FDA under an Emergency Use Authorization (EUA). This EUA will remain  in effect (meaning this test can be used) for  the duration of the COVID-19 declaration under Section 564(b)(1) of the Act, 21 U.S.C. section 360bbb-3(b)(1), unless the authorization is terminated or revoked sooner.    Influenza A by PCR NEGATIVE NEGATIVE Final   Influenza B by PCR NEGATIVE NEGATIVE Final    Comment: (NOTE) The Xpert Xpress SARS-CoV-2/FLU/RSV assay is intended as an aid in  the diagnosis of influenza from Nasopharyngeal swab specimens and  should not be used as a sole basis for treatment. Nasal washings and  aspirates are unacceptable for Xpert Xpress SARS-CoV-2/FLU/RSV  testing. Fact Sheet for Patients: PinkCheek.be Fact Sheet for Healthcare Providers: GravelBags.it This test is not yet approved or cleared by the [place] FDA and  has been authorized for detection and/or diagnosis of SARS-CoV-2 by  FDA under an Emergency Use Authorization (EUA). This EUA will remain  in effect (meaning this test can be used) for the duration of the  Covid-19 declaration under Section 564(b)(1) of the Act, 21  U.S.C. section 360bbb-3(b)(1), unless the authorization is  terminated or revoked. Performed at [facility], [address] [place], [place] [address]   Body fluid culture     Status: None (Preliminary result)   Collection Time: 06/16/19  2:42 PM   Specimen: PATH Cytology Peritoneal fluid  Result Value Ref Range Status   Specimen Description   Final    PERITONEAL Performed at [facility], [address], [place], [place] [address]    Special Requests   Final    NONE Performed at [facility], [address] [place], [place] [address]    Gram Stain   Final    FEW WBC PRESENT, PREDOMINANTLY MONONUCLEAR NO ORGANISMS SEEN    Culture   Final    NO GROWTH 2 DAYS Performed at [facility], [place] [address] [place], [place] [address]    Report Status PENDING  Incomplete    Coagulation Studies: Recent Labs     06/15/19 1305  LABPROT 15.1  INR 1.2    Urinalysis: No results for input(s): COLORURINE, LABSPEC, PHURINE, GLUCOSEU, HGBUR, BILIRUBINUR, KETONESUR, PROTEINUR, UROBILINOGEN, NITRITE, LEUKOCYTESUR in the last 72 hours.  Invalid input(s): APPERANCEUR    Imaging: US RENAL  Result Date: 06/16/2019 CLINICAL DATA:  Acute kidney failure.  Chronic kidney disease. EXAM: RENAL / URINARY TRACT ULTRASOUND COMPLETE COMPARISON:  07/29/2018, ultrasound 02/22/2017 FINDINGS: Right Kidney: Renal measurements: 9.4 x 4.4 x 4.2 centimeters = volume: 90 mL. On previous ultrasound 02/22/2017, the RIGHT kidney measured 10.1 centimeters. Two small exophytic lesions are identified in the midpole region, measuring 0.9 and 1.2 centimeters. These lesions likely represent cysts given stability since 2018. Left Kidney: Renal measurements: 8.4 x 4.7 x 4.1 centimeters = volume: 86 mL. On previous ultrasound 02/22/2017, the LEFT kidney measured 10.0 centimeters. Echogenicity within normal limits. No mass or  hydronephrosis visualized. Bladder: Decompressed Other: None. IMPRESSION: Kidneys are smaller compared to prior studies.  No hydronephrosis. Stable exophytic RIGHT renal cysts. Electronically Signed   By: Nolon Nations M.D.   On: 06/16/2019 14:45   US Paracentesis  Result Date: 06/16/2019 INDICATION: Acute renal failure with ascites. EXAM: ULTRASOUND GUIDED  PARACENTESIS MEDICATIONS: None. COMPLICATIONS: None immediate. PROCEDURE: Informed written consent was obtained from the patient after a discussion of the risks, benefits and alternatives to treatment. A timeout was performed prior to the initiation of the procedure. Initial ultrasound scanning demonstrates a large amount of ascites within the left lower abdominal quadrant. The left lower abdomen was prepped and draped in the usual sterile fashion. 1% lidocaine was used for local anesthesia. Following this, a 6 Fr Safe-T-Centesis catheter was introduced. An ultrasound  image was saved for documentation purposes. The paracentesis was performed. The catheter was removed and a dressing was applied. The patient tolerated the procedure well without immediate post procedural complication. FINDINGS: A total of approximately 3 L of pink colored fluid was removed. Samples were sent to the laboratory as requested by the clinical team. Max volume to be removed was 3 L and there was residual fluid after the paracentesis. IMPRESSION: Successful ultrasound-guided paracentesis yielding 3 liters of peritoneal fluid. Electronically Signed   By: Markus Daft M.D.   On: 06/16/2019 14:51     Medications:   . sodium chloride    . albumin human     . cholecalciferol  2,000 Units Oral Daily  . digoxin  0.0625 mg Oral Daily  . feeding supplement (ENSURE ENLIVE)  237 mL Oral BID BM  . furosemide  40 mg Intravenous Q12H  . heparin  5,000 Units Subcutaneous Q8H  . levothyroxine  75 mcg Oral Daily  . loratadine  10 mg Oral Daily  . metoprolol succinate  100 mg Oral BID  . midodrine  5 mg Oral TID WC  . multivitamin-lutein  1 capsule Oral Daily  . potassium chloride SA  20 mEq Oral Daily  . sodium chloride flush  3 mL Intravenous Q12H   sodium chloride, acetaminophen, acetaminophen, fluticasone, ondansetron (ZOFRAN) IV, sodium chloride flush  Assessment/ Plan:  86 y.o. female with  systolic congestive heart failure, atrial fibrillation, hypertension, hypothyroidism, hepatic cirrhosis, coronary artery disease   admitted on 06/15/2019 for  Abdominal distention [R14.0] Anasarca [R60.1] CHF (congestive heart failure), NYHA class I, acute on chronic, systolic (HCC) [id number] Acute on chronic congestive heart failure, unspecified heart failure type ([place]) [I50.9]   1. Acute renal failure on chronic kidney disease stage IIIB: baseline creatinine of 1.4, GFR of 36 on 04/13/2019.  No IV contrast exposure Chronic Kidney Disease most likely due to hypertensive nephrosclerosis Renal U.S neg  for obstruction. Stable exophytic cysts Serum creatinine is improving slowly.  2. Acute exacerbation of ch sys CHF Currently on furosemide 40 mg IV every 12 hours Monitor volume status closely.  Avoid hypotension  3. Cirrhosis with ascites 3000 cc removed with paracentesis 3/19 IV furosemide Albumin 25 g every 12 hours Avoid hypotension Continue midodrine  4.  Hypokalemia Likely induced by diuresis Potassium supplementation as needed    LOS: 3 Keilon Ressel 3/21/202112:19 PM  [place], [place]  Note: This note was prepared with Dragon dictation. Any transcription errors are unintentional

## 2019-06-18 NOTE — Progress Notes (Signed)
PROGRESS NOTE    Desiree Koch  [id number] DOB: [birth year] DOA: 06/15/2019 PCP: Adin Hector, MD    Assessment & Plan:   Principal Problem:   Acute on chronic systolic CHF (congestive heart failure) ([place]) Active Problems:   AKI (acute kidney injury) ([place])   AF (paroxysmal atrial fibrillation) (HCC)   Hypothyroidism   CAD (coronary artery disease)   CHF (congestive heart failure), NYHA class I, acute on chronic, systolic (HCC)    Desiree Koch is a 86 y.o. female with medical history significant for atrial fibrillation not on anticoagulation due to history of bleeding, chronic systolic heart failure with last known LVEF of 20%, hypothyroidism, hypertension, liver cirrhosis and coronary artery disease.  Patient was brought to the emergency room by her daughter for evaluation of worsening lower extremity swelling and increasing abdominal girth.   Acute on chronic systolic CHF, LVEF [id number] --shortness of breath with exertion, 2 pillow orthopnea and bilateral lower extremity swelling, 10 pound weight gain.  Home regimen of torsemide 40mg  daily, reportedly not producing much urine with this. Patient's last 2D Echocardiogram showed an LVEF of 20%.  Repeat similar. PLAN: --Lasix 40 mg IV every 12, per nephrology --Maintain low sodium diet and fluid restriction --Strict I/O and Daily weights --Continue metoprolol --cardiology consult  Paroxysmal atrial fibrillation Patient is not on long-term anticoagulation due to history of bleeding --Continue metoprolol for rate control --continue digoxin, per cards  Acute renal failure on chronic kidney disease stage IIIB, improved --Cr 2.38 on presentation.  baseline creatinine of 1.4, GFR of 36 on 04/13/2019. --Renal U.S neg for obstruction. Stable exophytic cysts. --Nephrology consulted PLAN: - CKD work up: SPEP/UPEP, hepatitis panel, urine studies, per nephrology - Not on an ARB. Has history of ACE-I due to cough.    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  Never had paracentesis before.  No abdominal pain, fever, or leukocytosis to suggest SBP. --Paracentesis 3/19 with 3L removed (a lot more left, but want to avoid causing hepatorenal syndrome).   PLAN: --IV albumin --continue IV lasix, per nephrology --continue midodrine (new) --Will likely need another paracentesis during this admission, plan for Monday  Hypokalemia Likely induced by diuresis Potassium supplementation as needed  Anemia with chronic kidney disease: macrocytic - iron studies and anemia labs all wnl.  Hypothyroidism Continue Synthroid  History of coronary artery disease Stable Continue aspirin and beta-blockers   DVT prophylaxis: Heparin SQ Code Status: Full code  Family Communication: updated daughter at the bedside today Disposition Plan: from home, like back home with [facility], likely in 1-2 days.  Daughter requests resources with in-home help from [facility].   Subjective and Interval History:  Pt reported doing much better today, felt back to baseline.  No fever, dyspnea, chest pain, abdominal pain, N/V/D, dysuria.  Worked with PT today.   Objective: Vitals:   06/17/19 1936 06/17/19 2126 06/18/19 0343 06/18/19 0741  BP:  106/65 117/62 121/73  Pulse: 100 68 (!) 107 81  Resp:   16 16  Temp: 98.5 F (36.9 C)  98.2 F (36.8 C) 98 F (36.7 C)  TempSrc: Oral  Oral Oral  SpO2: 98%  95% 98%  Weight:   66.7 kg   Height:        Intake/Output Summary (Last 24 hours) at 06/18/2019 1454 Last data filed at 06/18/2019 1120 Gross per 24 hour  Intake 423.2 ml  Output 1800 ml  Net -1376.8 ml   Filed Weights   06/16/19 0240 06/17/19  0225 06/18/19 0343  Weight: 69.4 kg 66.7 kg 66.7 kg    Examination:   Constitutional: NAD, AAOx3 HEENT: conjunctivae and lids normal, EOMI CV: RRR no M,R,G. Distal pulses +2.  No cyanosis.   RESP: CTA B/L, normal respiratory effort, on RA GI: +BS, NT,  abdomen distended and appeared smaller Extremities: 1+ pitting edema in BLE SKIN: warm, dry and intact Neuro: II - XII grossly intact.  Sensation intact Psych: Normal mood and affect.  Appropriate judgement and reason   Data Reviewed: I have personally reviewed following labs and imaging studies  CBC: Recent Labs  Lab 06/15/19 1055 06/17/19 0450 06/18/19 0627  WBC 7.3 6.0 6.9  HGB 11.4* 8.7* 9.1*  HCT 32.9* 26.0* 27.2*  MCV 102.2* 104.8* 104.6*  PLT 159 117* 123XX123*   Basic Metabolic Panel: Recent Labs  Lab 06/15/19 1055 06/16/19 0553 06/17/19 0450 06/18/19 0627  NA 134* 135 137 138  K 3.7 4.1 3.2* 3.8  CL 94* 95* 98 99  CO2 27 29 29 29   GLUCOSE 102* 98 83 97  BUN 35* 38* 35* 33*  CREATININE 2.38* 2.19* 1.95* 1.83*  CALCIUM 9.4 8.9 8.7* 8.9  MG  --   --  1.8 1.7   GFR: Estimated Creatinine Clearance: 19.3 mL/min (A) (by C-G formula based on SCr of 1.83 mg/dL (H)). Liver Function Tests: Recent Labs  Lab 06/15/19 1055  AST 54*  ALT 14  ALKPHOS 79  BILITOT 2.7*  PROT 6.2*  ALBUMIN 2.5*   No results for input(s): LIPASE, AMYLASE in the last 168 hours. No results for input(s): AMMONIA in the last 168 hours. Coagulation Profile: Recent Labs  Lab 06/15/19 1305  INR 1.2   Cardiac Enzymes: No results for input(s): CKTOTAL, CKMB, CKMBINDEX, TROPONINI in the last 168 hours. BNP (last 3 results) No results for input(s): PROBNP in the last 8760 hours. HbA1C: No results for input(s): HGBA1C in the last 72 hours. CBG: No results for input(s): GLUCAP in the last 168 hours. Lipid Profile: No results for input(s): CHOL, HDL, LDLCALC, TRIG, CHOLHDL, LDLDIRECT in the last 72 hours. Thyroid Function Tests: No results for input(s): TSH, T4TOTAL, FREET4, T3FREE, THYROIDAB in the last 72 hours. Anemia Panel: Recent Labs    06/16/19 1711  VITAMINB12 1,689*  FOLATE 22.0  FERRITIN 238  TIBC 200*  IRON 61   Sepsis Labs: No results for input(s): PROCALCITON,  LATICACIDVEN in the last 168 hours.  Recent Results (from the past 240 hour(s))  Respiratory Panel by RT PCR (Flu A&B, Covid) - Nasopharyngeal Swab     Status: None   Collection Time: 06/15/19  1:05 PM   Specimen: Nasopharyngeal Swab  Result Value Ref Range Status   SARS Coronavirus 2 by RT PCR NEGATIVE NEGATIVE Final    Comment: (NOTE) SARS-CoV-2 target nucleic acids are NOT DETECTED. The SARS-CoV-2 RNA is generally detectable in upper respiratoy specimens during the acute phase of infection. The lowest concentration of SARS-CoV-2 viral copies this assay can detect is 131 copies/mL. A negative result does not preclude SARS-Cov-2 infection and should not be used as the sole basis for treatment or other patient management decisions. A negative result may occur with  improper specimen collection/handling, submission of specimen other than nasopharyngeal swab, presence of viral mutation(s) within the areas targeted by this assay, and inadequate number of viral copies (<131 copies/mL). A negative result must be combined with clinical observations, patient history, and epidemiological information. The expected result is Negative. Fact Sheet for Patients:  PinkCheek.be  Fact Sheet for Healthcare Providers:  GravelBags.it This test is not yet ap proved or cleared by the [place] FDA and  has been authorized for detection and/or diagnosis of SARS-CoV-2 by FDA under an Emergency Use Authorization (EUA). This EUA will remain  in effect (meaning this test can be used) for the duration of the COVID-19 declaration under Section 564(b)(1) of the Act, 21 U.S.C. section 360bbb-3(b)(1), unless the authorization is terminated or revoked sooner.    Influenza A by PCR NEGATIVE NEGATIVE Final   Influenza B by PCR NEGATIVE NEGATIVE Final    Comment: (NOTE) The Xpert Xpress SARS-CoV-2/FLU/RSV assay is intended as an aid in  the diagnosis of  influenza from Nasopharyngeal swab specimens and  should not be used as a sole basis for treatment. Nasal washings and  aspirates are unacceptable for Xpert Xpress SARS-CoV-2/FLU/RSV  testing. Fact Sheet for Patients: PinkCheek.be Fact Sheet for Healthcare Providers: GravelBags.it This test is not yet approved or cleared by the [place] FDA and  has been authorized for detection and/or diagnosis of SARS-CoV-2 by  FDA under an Emergency Use Authorization (EUA). This EUA will remain  in effect (meaning this test can be used) for the duration of the  Covid-19 declaration under Section 564(b)(1) of the Act, 21  U.S.C. section 360bbb-3(b)(1), unless the authorization is  terminated or revoked. Performed at [facility], [address] [place], [place] [address]   Body fluid culture     Status: None (Preliminary result)   Collection Time: 06/16/19  2:42 PM   Specimen: PATH Cytology Peritoneal fluid  Result Value Ref Range Status   Specimen Description   Final    PERITONEAL Performed at [facility], [address], [place], [place] [address]    Special Requests   Final    NONE Performed at [facility], [address] [place], [place] [address]    Gram Stain   Final    FEW WBC PRESENT, PREDOMINANTLY MONONUCLEAR NO ORGANISMS SEEN    Culture   Final    NO GROWTH 2 DAYS Performed at [facility], [place] [address] [place], [place] [address]    Report Status PENDING  Incomplete      Radiology Studies: No results found.   Scheduled Meds: . cholecalciferol  2,000 Units Oral Daily  . digoxin  0.0625 mg Oral Daily  . feeding supplement (ENSURE ENLIVE)  237 mL Oral BID BM  . furosemide  40 mg Intravenous Q12H  . heparin  5,000 Units Subcutaneous Q8H  . levothyroxine  75 mcg Oral Daily  . loratadine  10 mg Oral Daily  . metoprolol succinate  100 mg Oral BID  . midodrine  5 mg Oral  TID WC  . multivitamin-lutein  1 capsule Oral Daily  . potassium chloride SA  20 mEq Oral Daily  . sodium chloride flush  3 mL Intravenous Q12H   Continuous Infusions: . sodium chloride    . albumin human       LOS: 3 days     Enzo Bi, MD [facility] If 7PM-7AM, please contact night-coverage 06/18/2019, 2:54 PM

## 2019-06-18 NOTE — Progress Notes (Signed)
I have reviewed the charting completed by the Student-RN and I agree with his assessments.  Christobal Morado A Shiheem Corporan, RN 

## 2019-06-18 NOTE — Progress Notes (Signed)
[facility] Cardiology    SUBJECTIVE: Patient feels much better less shortness of breath more active feels that her abdomen less distended ready to ambulate in the halls denies any pain no fever chills or sweats.  Denies any significant palpitations or tachycardia   Vitals:   06/17/19 1936 06/17/19 2126 06/18/19 0343 06/18/19 0741  BP:  106/65 117/62 121/73  Pulse: 100 68 (!) 107 81  Resp:   16 16  Temp: 98.5 F (36.9 C)  98.2 F (36.8 C) 98 F (36.7 C)  TempSrc: Oral  Oral Oral  SpO2: 98%  95% 98%  Weight:   66.7 kg   Height:         Intake/Output Summary (Last 24 hours) at 06/18/2019 1242 Last data filed at 06/18/2019 1120 Gross per 24 hour  Intake 423.2 ml  Output 1800 ml  Net -1376.8 ml      PHYSICAL EXAM  General: Well developed, well nourished, in no acute distress HEENT:  Normocephalic and atramatic Neck:  No JVD.  Lungs: Clear bilaterally to auscultation and percussion. Heart: HRRR . Normal S1 and S2 without gallops or murmurs.  Abdomen: Bowel sounds are positive, abdomen soft and non-tender  Msk:  Back normal, normal gait. Normal strength and tone for age. Extremities: No clubbing, cyanosis or edema.   Neuro: Alert and oriented X 3. Psych:  Good affect, responds appropriately   LABS: Basic Metabolic Panel: Recent Labs    06/17/19 0450 06/18/19 0627  NA 137 138  K 3.2* 3.8  CL 98 99  CO2 29 29  GLUCOSE 83 97  BUN 35* 33*  CREATININE 1.95* 1.83*  CALCIUM 8.7* 8.9  MG 1.8 1.7   Liver Function Tests: No results for input(s): AST, ALT, ALKPHOS, BILITOT, PROT, ALBUMIN in the last 72 hours. No results for input(s): LIPASE, AMYLASE in the last 72 hours. CBC: Recent Labs    06/17/19 0450 06/18/19 0627  WBC 6.0 6.9  HGB 8.7* 9.1*  HCT 26.0* 27.2*  MCV 104.8* 104.6*  PLT 117* 121*   Cardiac Enzymes: No results for input(s): CKTOTAL, CKMB, CKMBINDEX, TROPONINI in the last 72 hours. BNP: Invalid input(s): POCBNP D-Dimer: No results for input(s):  DDIMER in the last 72 hours. Hemoglobin A1C: No results for input(s): HGBA1C in the last 72 hours. Fasting Lipid Panel: No results for input(s): CHOL, HDL, LDLCALC, TRIG, CHOLHDL, LDLDIRECT in the last 72 hours. Thyroid Function Tests: Recent Labs    06/15/19 1305  TSH 2.979   Anemia Panel: Recent Labs    06/16/19 1711  VITAMINB12 1,689*  FOLATE 22.0  FERRITIN 238  TIBC 200*  IRON 61    US RENAL  Result Date: 06/16/2019 CLINICAL DATA:  Acute kidney failure.  Chronic kidney disease. EXAM: RENAL / URINARY TRACT ULTRASOUND COMPLETE COMPARISON:  07/29/2018, ultrasound 02/22/2017 FINDINGS: Right Kidney: Renal measurements: 9.4 x 4.4 x 4.2 centimeters = volume: 90 mL. On previous ultrasound 02/22/2017, the RIGHT kidney measured 10.1 centimeters. Two small exophytic lesions are identified in the midpole region, measuring 0.9 and 1.2 centimeters. These lesions likely represent cysts given stability since 2018. Left Kidney: Renal measurements: 8.4 x 4.7 x 4.1 centimeters = volume: 86 mL. On previous ultrasound 02/22/2017, the LEFT kidney measured 10.0 centimeters. Echogenicity within normal limits. No mass or hydronephrosis visualized. Bladder: Decompressed Other: None. IMPRESSION: Kidneys are smaller compared to prior studies.  No hydronephrosis. Stable exophytic RIGHT renal cysts. Electronically Signed   By: Nolon Nations M.D.   On: 06/16/2019 14:45  US Paracentesis  Result Date: 06/16/2019 INDICATION: Acute renal failure with ascites. EXAM: ULTRASOUND GUIDED  PARACENTESIS MEDICATIONS: None. COMPLICATIONS: None immediate. PROCEDURE: Informed written consent was obtained from the patient after a discussion of the risks, benefits and alternatives to treatment. A timeout was performed prior to the initiation of the procedure. Initial ultrasound scanning demonstrates a large amount of ascites within the left lower abdominal quadrant. The left lower abdomen was prepped and draped in the usual  sterile fashion. 1% lidocaine was used for local anesthesia. Following this, a 6 Fr Safe-T-Centesis catheter was introduced. An ultrasound image was saved for documentation purposes. The paracentesis was performed. The catheter was removed and a dressing was applied. The patient tolerated the procedure well without immediate post procedural complication. FINDINGS: A total of approximately 3 L of pink colored fluid was removed. Samples were sent to the laboratory as requested by the clinical team. Max volume to be removed was 3 L and there was residual fluid after the paracentesis. IMPRESSION: Successful ultrasound-guided paracentesis yielding 3 liters of peritoneal fluid. Electronically Signed   By: Markus Daft M.D.   On: 06/16/2019 14:51     Echo moderate to severely depressed overall left ventricular function ejection fraction around 25%  TELEMETRY: Atrial fibrillation rapid ventricular response nonspecific ST-T wave changes bundle branch block:  ASSESSMENT AND PLAN:  Principal Problem:   Acute on chronic systolic CHF (congestive heart failure) (HCC) Active Problems:   AKI (acute kidney injury) ([place])   AF (paroxysmal atrial fibrillation) (HCC)   Hypothyroidism   CAD (coronary artery disease)   CHF (congestive heart failure), NYHA class I, acute on chronic, systolic (HCC) Cirrhosis of the liver Ascites with abdominal distention Generalized weakness Shortness of breath dyspnea on exertion . Plan Continue current therapy for heart failure Agree with diuresis for volume overload and heart failure Acute on chronic renal insufficiency Multivessel coronary artery disease stable angina Agree with nephrology input for renal insufficiency Chronic atrial fibrillation rapid ventricular response continue rate control Agree with paracentesis to help with ascites removal Increase activity to help with generalized weakness Poor anticoagulation candidate continue digoxin for rate control Continue  metoprolol for rate management     Yolonda Kida, MD 06/18/2019 12:42 PM

## 2019-06-19 ENCOUNTER — Inpatient Hospital Stay: Payer: Medicare HMO

## 2019-06-19 LAB — BASIC METABOLIC PANEL
Anion gap: 9 (ref 5–15)
BUN: 30 mg/dL — ABNORMAL HIGH (ref 8–23)
CO2: 30 mmol/L (ref 22–32)
Calcium: 9 mg/dL (ref 8.9–10.3)
Chloride: 103 mmol/L (ref 98–111)
Creatinine, Ser: 1.5 mg/dL — ABNORMAL HIGH (ref 0.44–1.00)
GFR calc Af Amer: 36 mL/min — ABNORMAL LOW (ref >60)
GFR calc non Af Amer: 31 mL/min — ABNORMAL LOW (ref >60)
Glucose, Bld: 86 mg/dL (ref 70–99)
Potassium: 3.4 mmol/L — ABNORMAL LOW (ref 3.5–5.1)
Sodium: 142 mmol/L (ref 135–145)

## 2019-06-19 LAB — CBC
HCT: 27.1 % — ABNORMAL LOW (ref 36.0–46.0)
Hemoglobin: 9 g/dL — ABNORMAL LOW (ref 12.0–15.0)
MCH: 34.7 pg — ABNORMAL HIGH (ref 26.0–34.0)
MCHC: 33.2 g/dL (ref 30.0–36.0)
MCV: 104.6 fL — ABNORMAL HIGH (ref 80.0–100.0)
Platelets: 122 10*3/uL — ABNORMAL LOW (ref 150–400)
RBC: 2.59 MIL/uL — ABNORMAL LOW (ref 3.87–5.11)
RDW: 16.7 % — ABNORMAL HIGH (ref 11.5–15.5)
WBC: 6.9 10*3/uL (ref 4.0–10.5)
nRBC: 0 % (ref 0.0–0.2)

## 2019-06-19 LAB — KAPPA/LAMBDA LIGHT CHAINS
Kappa free light chain: 55.1 mg/L — ABNORMAL HIGH (ref 3.3–19.4)
Kappa, lambda light chain ratio: 0.81 (ref 0.26–1.65)
Lambda free light chains: 68.2 mg/L — ABNORMAL HIGH (ref 5.7–26.3)

## 2019-06-19 LAB — MAGNESIUM: Magnesium: 1.6 mg/dL — ABNORMAL LOW (ref 1.7–2.4)

## 2019-06-19 MED ORDER — MAGNESIUM SULFATE IN D5W 1-5 GM/100ML-% IV SOLN
1.0000 g | Freq: Once | INTRAVENOUS | Status: AC
  Administered 2019-06-19: 1 g via INTRAVENOUS
  Filled 2019-06-19: qty 100

## 2019-06-19 MED ORDER — FUROSEMIDE 40 MG PO TABS
40.0000 mg | ORAL_TABLET | Freq: Every day | ORAL | Status: DC
  Administered 2019-06-20: 40 mg via ORAL
  Filled 2019-06-19: qty 1

## 2019-06-19 MED ORDER — SPIRONOLACTONE 25 MG PO TABS
12.5000 mg | ORAL_TABLET | Freq: Every day | ORAL | Status: DC
  Administered 2019-06-19 – 2019-06-20 (×2): 12.5 mg via ORAL
  Filled 2019-06-19 (×2): qty 0.5
  Filled 2019-06-19 (×2): qty 1

## 2019-06-19 MED ORDER — MAGNESIUM SULFATE 50 % IJ SOLN: 1.0000 g | Freq: Once | INTRAMUSCULAR | Status: DC

## 2019-06-19 MED ORDER — POTASSIUM CHLORIDE CRYS ER 20 MEQ PO TBCR
40.0000 meq | EXTENDED_RELEASE_TABLET | Freq: Once | ORAL | Status: AC
  Administered 2019-06-19: 14:00:00 40 meq via ORAL
  Filled 2019-06-19: qty 2

## 2019-06-19 NOTE — Progress Notes (Signed)
Physical Therapy Treatment Patient Details Name: Desiree Koch MRN: [id number] DOB: 12-09-1930 Today's Date: 06/19/2019    History of Present Illness Patient is an 86 y/o F that presents with increased LE swelling, has been managed with a paracentesis on this admission.    PT Comments    Patient alert, agreeable to PT, family at bedside. The patient was able to perform bed and commode transfers with supervision and RW (handheld assist for commode). The patient ambulated ~429ft with RW and supervision, of note pt unable to ambulate and conversate at the same time. PT in bed with all needs in reach, current recommendation remains appropriate.    Follow Up Recommendations  Home health PT     Equipment Recommendations  Rolling walker with 5" wheels    Recommendations for Other Services       Precautions / Restrictions Precautions Precautions: Fall Restrictions Weight Bearing Restrictions: No    Mobility  Bed Mobility Overal bed mobility: Modified Independent Bed Mobility: Supine to Sit;Sit to Supine              Transfers Overall transfer level: Needs assistance Equipment used: Rolling walker (2 wheeled) Transfers: Sit to/from Stand Sit to Stand: Supervision            Ambulation/Gait Ambulation/Gait assistance: Supervision Gait Distance (Feet): 400 Feet Assistive device: Rolling walker (2 wheeled) Gait Pattern/deviations: WFL(Within Functional Limits)     General Gait Details: no LOB during ambulation   Stairs             Wheelchair Mobility    Modified Rankin (Stroke Patients Only)       Balance Overall balance assessment: Modified Independent                                          Cognition Arousal/Alertness: Awake/alert Behavior During Therapy: WFL for tasks assessed/performed Overall Cognitive Status: Within Functional Limits for tasks assessed                                         Exercises Other Exercises Other Exercises: pt utilized commode with supervision, and handheld assist. Did not want to use RW in bathroom    General Comments        Pertinent Vitals/Pain Pain Assessment: No/denies pain    Home Living                      Prior Function            PT Goals (current goals can now be found in the care plan section) Progress towards PT goals: Progressing toward goals    Frequency    Min 2X/week      PT Plan Current plan remains appropriate    Co-evaluation              AM-PAC PT "6 Clicks" Mobility   Outcome Measure  Help needed turning from your back to your side while in a flat bed without using bedrails?: None Help needed moving from lying on your back to sitting on the side of a flat bed without using bedrails?: None Help needed moving to and from a bed to a chair (including a wheelchair)?: None Help needed standing up from a chair using your arms (e.g., wheelchair or bedside  chair)?: None Help needed to walk in hospital room?: A Little Help needed climbing 3-5 steps with a railing? : A Little 6 Click Score: 22    End of Session Equipment Utilized During Treatment: Gait belt Activity Tolerance: Patient tolerated treatment well Patient left: in chair;Other (comment) Nurse Communication: Mobility status PT Visit Diagnosis: Difficulty in walking, not elsewhere classified (R26.2)     Time: [id number] PT Time Calculation (min) (ACUTE ONLY): 23 min  Charges:  $Therapeutic Exercise: 23-37 mins                     Lieutenant Diego PT, DPT 4:14 PM,06/19/19

## 2019-06-19 NOTE — Progress Notes (Signed)
Pts HR is elevated

## 2019-06-19 NOTE — Progress Notes (Signed)
PROGRESS NOTE    Desiree Koch  [id number] DOB: 1930-04-03 DOA: 06/15/2019 PCP: Adin Hector, MD    Assessment & Plan:   Principal Problem:   Acute on chronic systolic CHF (congestive heart failure) ([place]) Active Problems:   AKI (acute kidney injury) ([place])   AF (paroxysmal atrial fibrillation) (HCC)   Hypothyroidism   CAD (coronary artery disease)   CHF (congestive heart failure), NYHA class I, acute on chronic, systolic ([place])   Other cirrhosis of liver ([place])   Ascites   Anemia    Desiree Koch is a 86 y.o. Caucasian female with medical history significant for atrial fibrillation not on anticoagulation due to history of bleeding, chronic systolic heart failure with last known LVEF of 20%, hypothyroidism, hypertension, liver cirrhosis and coronary artery disease.  Patient was brought to the emergency room by her daughter for evaluation of worsening lower extremity swelling and increasing abdominal girth.   Acute on chronic systolic CHF, LVEF [id number] --shortness of breath with exertion, 2 pillow orthopnea and bilateral lower extremity swelling, 10 pound weight gain.  Home regimen of torsemide 40mg  daily, reportedly not producing much urine with this. Patient's last 2D Echocardiogram showed an LVEF of 20%.  Repeat similar. --Has been diuresed with Lasix 40 mg IV every 12, per nephrology, with good response PLAN: --switch to Lasix 40 mg and Aldactone 12.5 mg daily --Maintain low sodium diet and fluid restriction --Strict I/O and Daily weights --Continue metoprolol  Paroxysmal atrial fibrillation Patient is not on long-term anticoagulation due to history of bleeding --Continue metoprolol for rate control --continue digoxin, per cards  Acute renal failure on chronic kidney disease stage IIIB, improved --Cr 2.38 on presentation.  baseline creatinine of 1.4, GFR of 36 on 04/13/2019. --Renal U.S neg for obstruction. Stable exophytic cysts. --Nephrology consulted PLAN: -  CKD work up: SPEP/UPEP, hepatitis panel, urine studies, per nephrology - Not on an ARB. Has history of ACE-I due to cough.   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  Never had paracentesis before.  No abdominal pain, fever, or leukocytosis to suggest SBP. --Paracentesis 3/19 with 3L removed (a lot more left, but want to avoid causing hepatorenal syndrome).   PLAN: --d/c IV albumin --switch to Lasix 40 mg and Aldactone 12.5 mg daily to slow down ascites accumulation  --d/c midodrine  --2nd paracentesis attempted today but no drainable ascites  Hypokalemia and hypomag Likely induced by diuresis Potassium and mag supplementation as needed  Anemia with chronic kidney disease: macrocytic - iron studies and anemia labs all wnl.  Hypothyroidism Continue Synthroid  History of coronary artery disease Stable Continue aspirin and beta-blockers   DVT prophylaxis: Heparin SQ Code Status: Full code  Family Communication: updated daughter at the bedside today Disposition Plan: from home, like back home with [facility], likely tomorrow.  Daughter requests resources with in-home help from [facility].   Subjective and Interval History:  Pt reported doing well.  Had good urine output.  No fever, dyspnea, chest pain, abdominal pain, N/V/D, dysuria.  Attempted another paracentesis today, but there was no drainable ascites.     Objective: Vitals:   06/19/19 0254 06/19/19 0825 06/19/19 1036 06/19/19 1120  BP: 123/70 118/62 114/60 131/76  Pulse: 96 79  (!) 111  Resp:  18  19  Temp: 98.3 F (36.8 C) 98 F (36.7 C)  (!) 97.5 F (36.4 C)  TempSrc: Oral Oral  Oral  SpO2: 98% 97% 99% 98%  Weight: 66.7 kg  Height:        Intake/Output Summary (Last 24 hours) at 06/19/2019 1637 Last data filed at 06/19/2019 1357 Gross per 24 hour  Intake 850 ml  Output 1700 ml  Net -850 ml   Filed Weights   06/17/19 0225 06/18/19 0343 06/19/19 0254  Weight: 66.7 kg 66.7  kg 66.7 kg    Examination:   Constitutional: NAD, AAOx3 HEENT: conjunctivae and lids normal, EOMI CV: RRR no M,R,G. Distal pulses +2.  No cyanosis.   RESP: CTA B/L, normal respiratory effort, on RA GI: +BS, NT, abdomen much smaller and softer Extremities: edema in BLE improved  SKIN: warm, dry and intact Neuro: II - XII grossly intact.  Sensation intact Psych: Normal mood and affect.  Appropriate judgement and reason   Data Reviewed: I have personally reviewed following labs and imaging studies  CBC: Recent Labs  Lab 06/15/19 1055 06/17/19 0450 06/18/19 0627 06/19/19 0548  WBC 7.3 6.0 6.9 6.9  HGB 11.4* 8.7* 9.1* 9.0*  HCT 32.9* 26.0* 27.2* 27.1*  MCV 102.2* 104.8* 104.6* 104.6*  PLT 159 117* 121* 123XX123*   Basic Metabolic Panel: Recent Labs  Lab 06/15/19 1055 06/16/19 0553 06/17/19 0450 06/18/19 0627 06/19/19 0548  NA 134* 135 137 138 142  K 3.7 4.1 3.2* 3.8 3.4*  CL 94* 95* 98 99 103  CO2 27 29 29 29 30   GLUCOSE 102* 98 83 97 86  BUN 35* 38* 35* 33* 30*  CREATININE 2.38* 2.19* 1.95* 1.83* 1.50*  CALCIUM 9.4 8.9 8.7* 8.9 9.0  MG  --   --  1.8 1.7 1.6*   GFR: Estimated Creatinine Clearance: 23.5 mL/min (A) (by C-G formula based on SCr of 1.5 mg/dL (H)). Liver Function Tests: Recent Labs  Lab 06/15/19 1055  AST 54*  ALT 14  ALKPHOS 79  BILITOT 2.7*  PROT 6.2*  ALBUMIN 2.5*   No results for input(s): LIPASE, AMYLASE in the last 168 hours. No results for input(s): AMMONIA in the last 168 hours. Coagulation Profile: Recent Labs  Lab 06/15/19 1305  INR 1.2   Cardiac Enzymes: No results for input(s): CKTOTAL, CKMB, CKMBINDEX, TROPONINI in the last 168 hours. BNP (last 3 results) No results for input(s): PROBNP in the last 8760 hours. HbA1C: No results for input(s): HGBA1C in the last 72 hours. CBG: No results for input(s): GLUCAP in the last 168 hours. Lipid Profile: No results for input(s): CHOL, HDL, LDLCALC, TRIG, CHOLHDL, LDLDIRECT in the last  72 hours. Thyroid Function Tests: No results for input(s): TSH, T4TOTAL, FREET4, T3FREE, THYROIDAB in the last 72 hours. Anemia Panel: Recent Labs    06/16/19 1711  VITAMINB12 1,689*  FOLATE 22.0  FERRITIN 238  TIBC 200*  IRON 61   Sepsis Labs: No results for input(s): PROCALCITON, LATICACIDVEN in the last 168 hours.  Recent Results (from the past 240 hour(s))  Respiratory Panel by RT PCR (Flu A&B, Covid) - Nasopharyngeal Swab     Status: None   Collection Time: 06/15/19  1:05 PM   Specimen: Nasopharyngeal Swab  Result Value Ref Range Status   SARS Coronavirus 2 by RT PCR NEGATIVE NEGATIVE Final    Comment: (NOTE) SARS-CoV-2 target nucleic acids are NOT DETECTED. The SARS-CoV-2 RNA is generally detectable in upper respiratoy specimens during the acute phase of infection. The lowest concentration of SARS-CoV-2 viral copies this assay can detect is 131 copies/mL. A negative result does not preclude SARS-Cov-2 infection and should not be used as the sole basis for treatment or  other patient management decisions. A negative result may occur with  improper specimen collection/handling, submission of specimen other than nasopharyngeal swab, presence of viral mutation(s) within the areas targeted by this assay, and inadequate number of viral copies (<131 copies/mL). A negative result must be combined with clinical observations, patient history, and epidemiological information. The expected result is Negative. Fact Sheet for Patients:  PinkCheek.be Fact Sheet for Healthcare Providers:  GravelBags.it This test is not yet ap proved or cleared by the [place] FDA and  has been authorized for detection and/or diagnosis of SARS-CoV-2 by FDA under an Emergency Use Authorization (EUA). This EUA will remain  in effect (meaning this test can be used) for the duration of the COVID-19 declaration under Section 564(b)(1) of the  Act, 21 U.S.C. section 360bbb-3(b)(1), unless the authorization is terminated or revoked sooner.    Influenza A by PCR NEGATIVE NEGATIVE Final   Influenza B by PCR NEGATIVE NEGATIVE Final    Comment: (NOTE) The Xpert Xpress SARS-CoV-2/FLU/RSV assay is intended as an aid in  the diagnosis of influenza from Nasopharyngeal swab specimens and  should not be used as a sole basis for treatment. Nasal washings and  aspirates are unacceptable for Xpert Xpress SARS-CoV-2/FLU/RSV  testing. Fact Sheet for Patients: PinkCheek.be Fact Sheet for Healthcare Providers: GravelBags.it This test is not yet approved or cleared by the [place] FDA and  has been authorized for detection and/or diagnosis of SARS-CoV-2 by  FDA under an Emergency Use Authorization (EUA). This EUA will remain  in effect (meaning this test can be used) for the duration of the  Covid-19 declaration under Section 564(b)(1) of the Act, 21  U.S.C. section 360bbb-3(b)(1), unless the authorization is  terminated or revoked. Performed at [facility], [place] [place], [place] [address]   Body fluid culture     Status: None (Preliminary result)   Collection Time: 06/16/19  2:42 PM   Specimen: PATH Cytology Peritoneal fluid  Result Value Ref Range Status   Specimen Description   Final    PERITONEAL Performed at [facility], [address] [place], [place] [address]    Special Requests   Final    NONE Performed at [facility], [place] [place], [place] [address]    Gram Stain   Final    FEW WBC PRESENT, PREDOMINANTLY MONONUCLEAR NO ORGANISMS SEEN    Culture   Final    NO GROWTH 3 DAYS Performed at [facility], [place] [address], [place], [place] [address]    Report Status PENDING  Incomplete      Radiology Studies: US Abdomen Limited  Result Date: 06/19/2019 CLINICAL DATA:  Renal failure, history of  abdominal ascites EXAM: LIMITED ABDOMEN ULTRASOUND FOR ASCITES TECHNIQUE: Limited ultrasound survey for ascites was performed in all four abdominal quadrants. COMPARISON:  06/16/2019 FINDINGS: Survey ultrasound revealed only a small volume of ascites in the left lower quadrant. No large pocket for therapeutic paracentesis which was deferred after discussion with the patient, as there was no request for diagnostic labs from the fluid. IMPRESSION: 1. Small volume ascites.  Therapeutic paracentesis deferred. Electronically Signed   By: Lucrezia Europe M.D.   On: 06/19/2019 13:12     Scheduled Meds: . cholecalciferol  2,000 Units Oral Daily  . digoxin  0.0625 mg Oral Daily  . feeding supplement (ENSURE ENLIVE)  237 mL Oral BID BM  . [START ON 06/20/2019] furosemide  40 mg Oral Daily  . heparin  5,000 Units Subcutaneous Q8H  . levothyroxine  75 mcg Oral Daily  . loratadine  10 mg Oral Daily  . metoprolol succinate  100 mg Oral BID  . multivitamin-lutein  1 capsule Oral Daily  . potassium chloride SA  20 mEq Oral Daily  . sodium chloride flush  3 mL Intravenous Q12H  . spironolactone  12.5 mg Oral Daily   Continuous Infusions: . sodium chloride    . albumin human Stopped (06/19/19 1057)     LOS: 4 days     Enzo Bi, MD [facility] If 7PM-7AM, please contact night-coverage 06/19/2019, 4:37 PM

## 2019-06-19 NOTE — Progress Notes (Signed)
[place], [place] 06/19/19  Subjective:   Hospital day # 4 Patient underwent paracentesis on Friday when 3 L of fluid was removed Abdomen is soft. Continues to have lower extremity edema Feels like she is voiding more frequently Serum creatinine trend is improving slowly    Renal: 03/21 0701 - 03/22 0700 In: 340.1 [P.O.:240; I.V.:3; IV Piggyback:97.1] Out: 1600 [Urine:1600] Lab Results  Component Value Date   CREATININE 1.50 (H) 06/19/2019   CREATININE 1.83 (H) 06/18/2019   CREATININE 1.95 (H) 06/17/2019     Objective:  Vital signs in last 24 hours:  Temp:  [97.6 F (36.4 C)-98.3 F (36.8 C)] 98 F (36.7 C) (03/22 0825) Pulse Rate:  [77-96] 79 (03/22 0825) Resp:  [16-18] 18 (03/22 0825) BP: (114-123)/(59-70) 118/62 (03/22 0825) SpO2:  [97 %-100 %] 97 % (03/22 0825) Weight:  [66.7 kg] 66.7 kg (03/22 0254)  Weight change: 0 kg Filed Weights   06/17/19 0225 06/18/19 0343 06/19/19 0254  Weight: 66.7 kg 66.7 kg 66.7 kg    Intake/Output:    Intake/Output Summary (Last 24 hours) at 06/19/2019 0953 Last data filed at 06/19/2019 0900 Gross per 24 hour  Intake 400.13 ml  Output 1450 ml  Net -1049.87 ml     Physical Exam: General:  No acute distress, laying in the  HEENT  head anicteric, moist oral mucous membrane  Pulm/lungs  normal breathing effort, clear to auscultate  CVS/Heart  no rub or gallop, irregular rhythm  Abdomen:   Soft, distended, ascites  Extremities:  2+ lower extremity edema over ankles  Neurologic:  Alert, oriented  Skin:  No acute rashes    Basic Metabolic Panel:  Recent Labs  Lab 06/15/19 1055 06/15/19 1055 06/16/19 0553 06/16/19 0553 06/17/19 0450 06/18/19 0627 06/19/19 0548  NA 134*  --  135  --  137 138 142  K 3.7  --  4.1  --  3.2* 3.8 3.4*  CL 94*  --  95*  --  98 99 103  CO2 27  --  29  --  29 29 30   GLUCOSE 102*  --  98  --  83 97 86  BUN 35*  --  38*  --  35* 33* 30*  CREATININE 2.38*  --   2.19*  --  1.95* 1.83* 1.50*  CALCIUM 9.4   < > 8.9   < > 8.7* 8.9 9.0  MG  --   --   --   --  1.8 1.7 1.6*   < > = values in this interval not displayed.     CBC: Recent Labs  Lab 06/15/19 1055 06/17/19 0450 06/18/19 0627 06/19/19 0548  WBC 7.3 6.0 6.9 6.9  HGB 11.4* 8.7* 9.1* 9.0*  HCT 32.9* 26.0* 27.2* 27.1*  MCV 102.2* 104.8* 104.6* 104.6*  PLT 159 117* 121* 122*      Lab Results  Component Value Date   HEPBSAG NON REACTIVE 06/17/2019   HEPBSAB NON REACTIVE 06/17/2019   HEPBIGM NON REACTIVE 06/17/2019      Microbiology:  Recent Results (from the past 240 hour(s))  Respiratory Panel by RT PCR (Flu A&B, Covid) - Nasopharyngeal Swab     Status: None   Collection Time: 06/15/19  1:05 PM   Specimen: Nasopharyngeal Swab  Result Value Ref Range Status   SARS Coronavirus 2 by RT PCR NEGATIVE NEGATIVE Final    Comment: (NOTE) SARS-CoV-2 target nucleic acids are NOT DETECTED. The SARS-CoV-2 RNA is generally detectable in upper  respiratoy specimens during the acute phase of infection. The lowest concentration of SARS-CoV-2 viral copies this assay can detect is 131 copies/mL. A negative result does not preclude SARS-Cov-2 infection and should not be used as the sole basis for treatment or other patient management decisions. A negative result may occur with  improper specimen collection/handling, submission of specimen other than nasopharyngeal swab, presence of viral mutation(s) within the areas targeted by this assay, and inadequate number of viral copies (<131 copies/mL). A negative result must be combined with clinical observations, patient history, and epidemiological information. The expected result is Negative. Fact Sheet for Patients:  PinkCheek.be Fact Sheet for Healthcare Providers:  GravelBags.it This test is not yet ap proved or cleared by the [place] FDA and  has been authorized for detection  and/or diagnosis of SARS-CoV-2 by FDA under an Emergency Use Authorization (EUA). This EUA will remain  in effect (meaning this test can be used) for the duration of the COVID-19 declaration under Section 564(b)(1) of the Act, 21 U.S.C. section 360bbb-3(b)(1), unless the authorization is terminated or revoked sooner.    Influenza A by PCR NEGATIVE NEGATIVE Final   Influenza B by PCR NEGATIVE NEGATIVE Final    Comment: (NOTE) The Xpert Xpress SARS-CoV-2/FLU/RSV assay is intended as an aid in  the diagnosis of influenza from Nasopharyngeal swab specimens and  should not be used as a sole basis for treatment. Nasal washings and  aspirates are unacceptable for Xpert Xpress SARS-CoV-2/FLU/RSV  testing. Fact Sheet for Patients: PinkCheek.be Fact Sheet for Healthcare Providers: GravelBags.it This test is not yet approved or cleared by the [place] FDA and  has been authorized for detection and/or diagnosis of SARS-CoV-2 by  FDA under an Emergency Use Authorization (EUA). This EUA will remain  in effect (meaning this test can be used) for the duration of the  Covid-19 declaration under Section 564(b)(1) of the Act, 21  U.S.C. section 360bbb-3(b)(1), unless the authorization is  terminated or revoked. Performed at [facility], [address] [place], [place] [address]   Body fluid culture     Status: None (Preliminary result)   Collection Time: 06/16/19  2:42 PM   Specimen: PATH Cytology Peritoneal fluid  Result Value Ref Range Status   Specimen Description   Final    PERITONEAL Performed at [facility], [address], [place], [place] [address]    Special Requests   Final    NONE Performed at [facility], [address] [place], [place] [address]    Gram Stain   Final    FEW WBC PRESENT, PREDOMINANTLY MONONUCLEAR NO ORGANISMS SEEN    Culture   Final    NO GROWTH 3 DAYS Performed at  [facility], [place] [address], [place], [place] [address]    Report Status PENDING  Incomplete    Coagulation Studies: No results for input(s): LABPROT, INR in the last 72 hours.  Urinalysis: No results for input(s): COLORURINE, LABSPEC, PHURINE, GLUCOSEU, HGBUR, BILIRUBINUR, KETONESUR, PROTEINUR, UROBILINOGEN, NITRITE, LEUKOCYTESUR in the last 72 hours.  Invalid input(s): APPERANCEUR    Imaging: No results found.   Medications:   . sodium chloride    . albumin human 25 g (06/19/19 0903)  . magnesium sulfate bolus IVPB     . cholecalciferol  2,000 Units Oral Daily  . digoxin  0.0625 mg Oral Daily  . feeding supplement (ENSURE ENLIVE)  237 mL Oral BID BM  . furosemide  40 mg Intravenous Q12H  .  heparin  5,000 Units Subcutaneous Q8H  . levothyroxine  75 mcg Oral Daily  . loratadine  10 mg Oral Daily  . metoprolol succinate  100 mg Oral BID  . midodrine  5 mg Oral TID WC  . multivitamin-lutein  1 capsule Oral Daily  . potassium chloride SA  20 mEq Oral Daily  . potassium chloride  40 mEq Oral Once  . sodium chloride flush  3 mL Intravenous Q12H   sodium chloride, acetaminophen, acetaminophen, fluticasone, ondansetron (ZOFRAN) IV, sodium chloride flush  Assessment/ Plan:  86 y.o. female with  systolic congestive heart failure, atrial fibrillation, hypertension, hypothyroidism, hepatic cirrhosis, coronary artery disease   admitted on 06/15/2019 for  Abdominal distention [R14.0] Anasarca [R60.1] CHF (congestive heart failure), NYHA class I, acute on chronic, systolic (HCC) [id number] Acute on chronic congestive heart failure, unspecified heart failure type ([place]) [I50.9]   1. Acute renal failure on chronic kidney disease stage IIIB: baseline creatinine of 1.4, GFR of 36 on 04/13/2019.  No IV contrast exposure Chronic Kidney Disease most likely due to hypertensive nephrosclerosis Renal U.S neg for obstruction. Stable exophytic cysts Serum creatinine is improving  slowly.  2. Acute exacerbation of ch sys CHF and LE edema Currently on furosemide 40 mg IV every 12 hours Monitor volume status closely.  Avoid hypotension Can consider changing to oral torsemide 40 mg daily  3. Cirrhosis with ascites 3000 cc removed with paracentesis 3/19 IV furosemide Albumin 25 g every 12 hours Avoid hypotension Continue midodrine  4.  Hypokalemia Likely induced by diuresis Potassium supplementation as needed    LOS: [place] 3/22/20219:53 AM  [place], [place]  Note: This note was prepared with Dragon dictation. Any transcription errors are unintentional

## 2019-06-19 NOTE — Care Management Important Message (Signed)
Important Message  Patient Details  Name: Desiree Koch MRN: [id number] Date of Birth: Mar 10, 1931   Medicare Important Message Given:  Yes     Dannette Barbara 06/19/2019, 12:00 PM

## 2019-06-19 NOTE — Progress Notes (Signed)
Patient presents for therapeutic  paracentesis. US limited abdomen shows trace amount of peritoneal fluid noted  Insufficient to perform a safe paracentesis. Procedure not performed.  

## 2019-06-19 NOTE — Progress Notes (Signed)
[facility] Cardiology  Patient Description: [name] is a 86 year old Caucasian female with PMH significant for CAD, h/o MI, congestive heart failure with systolic dysfunction and reduced EF (20-25%), cardiomyopathy likely ischemic, left bundle branch block, atrial fibrillation, atrial flutter (poor anticoagulation candidate due to history of bleeding), CRI, heart murmur, hyperlipidemia, hypertension, cirrhosis of the liver who was admitted on 06/15/2019 for acute on chronic systolic CHF, chest pain and atrial fibrillation.   SUBJECTIVE: The patient reports to be doing well today and is without any complaints at this time. She states that she was able to sleep well on last night. The patient denies any dyspnea and has been able to be more active by ambulating in the room/halls. She denies any chest pain, palpitations or peripheral edema.   OBJECTIVE: The patient appears well, without any signs of acute distress and was able to answer all questions appropriately. She was sitting upright in bed and eating her lunch.   Cardiac Diagnostic Studies: (06/16/19) Echocardiogram 2D complete:  IMPRESSIONS  1. Left ventricular ejection fraction, by estimation, is 20 to 25%. The  left ventricle has severely decreased function. The left ventricle  demonstrates regional wall motion abnormalities (see scoring  diagram/findings for description). The left  ventricular internal cavity size was mildly to moderately dilated. Left  ventricular diastolic parameters were normal.  2. Right ventricular systolic function is mildly reduced. The right  ventricular size is mildly enlarged. There is mildly elevated pulmonary  artery systolic pressure.  3. Left atrial size was moderately dilated.  4. Right atrial size was moderately dilated.  5. The mitral valve is grossly normal. Moderate mitral valve  regurgitation.  6. Tricuspid valve regurgitation is moderate.  7. The aortic valve is grossly normal. Aortic valve  regurgitation is not  visualized.    Vitals:   06/19/19 0254 06/19/19 0825 06/19/19 1036 06/19/19 1120  BP: 123/70 118/62 114/60 131/76  Pulse: 96 79  (!) 111  Resp:  18  19  Temp: 98.3 F (36.8 C) 98 F (36.7 C)  (!) 97.5 F (36.4 C)  TempSrc: Oral Oral  Oral  SpO2: 98% 97% 99% 98%  Weight: 66.7 kg     Height:         Intake/Output Summary (Last 24 hours) at 06/19/2019 1603 Last data filed at 06/19/2019 1357 Gross per 24 hour  Intake 850 ml  Output 1700 ml  Net -850 ml      PHYSICAL EXAM  General: Well developed, well nourished, in no acute distress HEENT:  Normocephalic and atramatic Neck:  No JVD, symmetrical  Lungs: Clear bilaterally to auscultation Heart: irregular HR and rhythm . Normal S1 and S2 without gallops or murmurs.  Abdomen: Bowel sounds are positive, abdomen soft and non-tender  Msk:  UTA; patient reports to be steady on her feet  Extremities: No clubbing, cyanosis or edema.   Neuro: Alert and oriented X 3. Psych:  Good affect, responds appropriately   LABS: Basic Metabolic Panel: Recent Labs    06/18/19 0627 06/19/19 0548  NA 138 142  K 3.8 3.4*  CL 99 103  CO2 29 30  GLUCOSE 97 86  BUN 33* 30*  CREATININE 1.83* 1.50*  CALCIUM 8.9 9.0  MG 1.7 1.6*   Liver Function Tests: No results for input(s): AST, ALT, ALKPHOS, BILITOT, PROT, ALBUMIN in the last 72 hours. No results for input(s): LIPASE, AMYLASE in the last 72 hours. CBC: Recent Labs    06/18/19 0627 06/19/19 0548  WBC 6.9 6.9  HGB 9.1* 9.0*  HCT 27.2* 27.1*  MCV 104.6* 104.6*  PLT 121* 122*   Cardiac Enzymes: No results for input(s): CKTOTAL, CKMB, CKMBINDEX, TROPONINI in the last 72 hours. BNP: Invalid input(s): POCBNP D-Dimer: No results for input(s): DDIMER in the last 72 hours. Hemoglobin A1C: No results for input(s): HGBA1C in the last 72 hours. Fasting Lipid Panel: No results for input(s): CHOL, HDL, LDLCALC, TRIG, CHOLHDL, LDLDIRECT in the last 72 hours.  Thyroid Function Tests: No results for input(s): TSH, T4TOTAL, T3FREE, THYROIDAB in the last 72 hours.  Invalid input(s): FREET3 Anemia Panel: Recent Labs    06/16/19 1711  VITAMINB12 1,689*  FOLATE 22.0  FERRITIN 238  TIBC 200*  IRON 61    US Abdomen Limited  Result Date: 06/19/2019 CLINICAL DATA:  Renal failure, history of abdominal ascites EXAM: LIMITED ABDOMEN ULTRASOUND FOR ASCITES TECHNIQUE: Limited ultrasound survey for ascites was performed in all four abdominal quadrants. COMPARISON:  06/16/2019 FINDINGS: Survey ultrasound revealed only a small volume of ascites in the left lower quadrant. No large pocket for therapeutic paracentesis which was deferred after discussion with the patient, as there was no request for diagnostic labs from the fluid. IMPRESSION: 1. Small volume ascites.  Therapeutic paracentesis deferred. Electronically Signed   By: Lucrezia Europe M.D.   On: 06/19/2019 13:12     Echo: IMPRESSIONS  1. Left ventricular ejection fraction, by estimation, is 20 to 25%. The  left ventricle has severely decreased function. The left ventricle  demonstrates regional wall motion abnormalities (see scoring  diagram/findings for description). The left  ventricular internal cavity size was mildly to moderately dilated. Left  ventricular diastolic parameters were normal.  2. Right ventricular systolic function is mildly reduced. The right  ventricular size is mildly enlarged. There is mildly elevated pulmonary  artery systolic pressure.  3. Left atrial size was moderately dilated.  4. Right atrial size was moderately dilated.  5. The mitral valve is grossly normal. Moderate mitral valve  regurgitation.  6. Tricuspid valve regurgitation is moderate.  7. The aortic valve is grossly normal. Aortic valve regurgitation is not  visualized.    TELEMETRY: Atrial Fibrillation with HR=81 bpn  ASSESSMENT AND PLAN:  Principal Problem:   Acute on chronic systolic CHF  (congestive heart failure) (HCC) Active Problems:   AKI (acute kidney injury) (HCC)   AF (paroxysmal atrial fibrillation) (HCC)   Hypothyroidism   CAD (coronary artery disease)   CHF (congestive heart failure), NYHA class I, acute on chronic, systolic (HCC)   Other cirrhosis of liver (HCC)   Ascites   Anemia  Hypokalemia  PLAN: -Acute on Chronic CHF with reduced EF of 20-25%, reasonably stable, patient denies dyspnea or BLE edema at this time  -Agree with diuresis with Lasix and spirolactone   -Continue current therapy for CHF   Acute on chronic renal insufficiency.   -Agree with nephrology input  -Conitnue to monitor kidney functioning for   -Multivessel coronary artery disease with stable angina, patient denies chest pain  -Continue conservative therapy at this time  -Consider statin therapy   Chronic atrial fibrillation rapid ventricular response, poor anticoagulation candidate  - Continue rate control with digoxin and metoprolol   - Continue with cardiac monitoring continuously   Ascites, reasonably stable, therapeutic paracentesis scheduled for today was unable to be safely performed due to only a trace amount of peritoneal fluid noted on abdominal ultrasound.  -Continue current management   -Hypokalemia  -Agree with K+ replacement   -Continue to monitor  BMP daily  Generalized weakness, reasonably stable  -Continue to increase activity to help with generalized weakness   Desiree Koch, ACNPC-AG  06/19/2019 4:03 PM

## 2019-06-20 LAB — PROTEIN ELECTROPHORESIS, SERUM
A/G Ratio: 1 (ref 0.7–1.7)
Albumin ELP: 2.8 g/dL — ABNORMAL LOW (ref 2.9–4.4)
Alpha-1-Globulin: 0.2 g/dL (ref 0.0–0.4)
Alpha-2-Globulin: 0.4 g/dL (ref 0.4–1.0)
Beta Globulin: 0.6 g/dL — ABNORMAL LOW (ref 0.7–1.3)
Gamma Globulin: 1.5 g/dL (ref 0.4–1.8)
Globulin, Total: 2.7 g/dL (ref 2.2–3.9)
Total Protein ELP: 5.5 g/dL — ABNORMAL LOW (ref 6.0–8.5)

## 2019-06-20 LAB — CBC
HCT: 26.3 % — ABNORMAL LOW (ref 36.0–46.0)
Hemoglobin: 8.7 g/dL — ABNORMAL LOW (ref 12.0–15.0)
MCH: 35.4 pg — ABNORMAL HIGH (ref 26.0–34.0)
MCHC: 33.1 g/dL (ref 30.0–36.0)
MCV: 106.9 fL — ABNORMAL HIGH (ref 80.0–100.0)
Platelets: 120 10*3/uL — ABNORMAL LOW (ref 150–400)
RBC: 2.46 MIL/uL — ABNORMAL LOW (ref 3.87–5.11)
RDW: 17.1 % — ABNORMAL HIGH (ref 11.5–15.5)
WBC: 7.2 10*3/uL (ref 4.0–10.5)
nRBC: 0 % (ref 0.0–0.2)

## 2019-06-20 LAB — PROTEIN ELECTRO, RANDOM URINE
Albumin ELP, Urine: UNDETERMINED %
Alpha-1-Globulin, U: UNDETERMINED %
Alpha-2-Globulin, U: UNDETERMINED %
Beta Globulin, U: UNDETERMINED %
Gamma Globulin, U: UNDETERMINED %
M Component, Ur: UNDETERMINED %
Total Protein, Urine: 9.2 mg/dL

## 2019-06-20 LAB — BODY FLUID CULTURE: Culture: NO GROWTH

## 2019-06-20 LAB — DIGOXIN LEVEL: Digoxin Level: 0.5 ng/mL — ABNORMAL LOW (ref 0.8–2.0)

## 2019-06-20 LAB — BASIC METABOLIC PANEL
Anion gap: 9 (ref 5–15)
BUN: 26 mg/dL — ABNORMAL HIGH (ref 8–23)
CO2: 28 mmol/L (ref 22–32)
Calcium: 8.8 mg/dL — ABNORMAL LOW (ref 8.9–10.3)
Chloride: 104 mmol/L (ref 98–111)
Creatinine, Ser: 1.59 mg/dL — ABNORMAL HIGH (ref 0.44–1.00)
GFR calc Af Amer: 34 mL/min — ABNORMAL LOW (ref >60)
GFR calc non Af Amer: 29 mL/min — ABNORMAL LOW (ref >60)
Glucose, Bld: 90 mg/dL (ref 70–99)
Potassium: 3.9 mmol/L (ref 3.5–5.1)
Sodium: 141 mmol/L (ref 135–145)

## 2019-06-20 LAB — MAGNESIUM: Magnesium: 1.9 mg/dL (ref 1.7–2.4)

## 2019-06-20 MED ORDER — DIGOXIN 62.5 MCG PO TABS: 0.0625 mg | ORAL_TABLET | Freq: Every day | ORAL | 2 refills | Status: DC

## 2019-06-20 MED ORDER — FUROSEMIDE 40 MG PO TABS: 40.0000 mg | ORAL_TABLET | Freq: Every day | ORAL | 2 refills | Status: AC

## 2019-06-20 MED ORDER — METOPROLOL SUCCINATE ER 100 MG PO TB24: 100.0000 mg | ORAL_TABLET | Freq: Every day | ORAL | 2 refills | Status: AC

## 2019-06-20 MED ORDER — SPIRONOLACTONE 25 MG PO TABS: 12.5000 mg | ORAL_TABLET | Freq: Every day | ORAL | 2 refills | Status: DC

## 2019-06-20 MED ORDER — HYDRALAZINE HCL 10 MG PO TABS: 10.0000 mg | ORAL_TABLET | Freq: Two times a day (BID) | ORAL | 3 refills | Status: DC

## 2019-06-20 MED ORDER — HYDRALAZINE HCL 10 MG PO TABS
10.0000 mg | ORAL_TABLET | Freq: Two times a day (BID) | ORAL | Status: DC
  Administered 2019-06-20: 10 mg via ORAL
  Filled 2019-06-20 (×2): qty 1

## 2019-06-20 MED ORDER — ISOSORBIDE MONONITRATE ER 30 MG PO TB24
15.0000 mg | ORAL_TABLET | Freq: Every day | ORAL | Status: DC
  Administered 2019-06-20: 15 mg via ORAL
  Filled 2019-06-20: qty 1

## 2019-06-20 NOTE — Progress Notes (Addendum)
[facility] [place] Cardiology  Patient Description: Desiree Koch is a 86 year old Caucasian female with PMH significant for CAD, h/o MI, congestive heart failure with systolic dysfunction and reduced EF (20-25%), cardiomyopathy likely ischemic, left bundle branch block, atrial fibrillation, atrial flutter (poor anticoagulation candidate due to history of bleeding), CRI, heart murmur, hyperlipidemia, hypertension, cirrhosis of the liver who was admitted on 06/15/2019 for acute on chronic systolic CHF, chest pain and atrial fibrillation.   SUBJECTIVE: The patient reports to be doing well today and is without any complaints at this time. She states that she was able to sleep well on last night and is looking forward to being discharged soon. The patient expresses concerns over her discharge date/time.  The patient denies any dyspnea upon exertion, palpitations, dizziness or chest pain. The patient reports that she has been able to ambulate without assistance to the restroom throughout the night.   OBJECTIVE: The patient appears well, without any signs of acute distress and was able to answer all questions appropriately. Patient has +2 BLE edema, but lungs are clear bilaterally upon auscultation.   Cardiac Diagnostic Studies: (06/16/19) Echocardiogram 2D complete:  IMPRESSIONS  1. Left ventricular ejection fraction, by estimation, is 20 to 25%. The  left ventricle has severely decreased function. The left ventricle  demonstrates regional wall motion abnormalities (see scoring  diagram/findings for description). The left  ventricular internal cavity size was mildly to moderately dilated. Left  ventricular diastolic parameters were normal.  2. Right ventricular systolic function is mildly reduced. The right  ventricular size is mildly enlarged. There is mildly elevated pulmonary  artery systolic pressure.  3. Left atrial size was moderately dilated.  4. Right atrial size was moderately dilated.  5. The mitral valve  is grossly normal. Moderate mitral valve  regurgitation.  6. Tricuspid valve regurgitation is moderate.  7. The aortic valve is grossly normal. Aortic valve regurgitation is not  visualized.    Vitals:   06/20/19 0123 06/20/19 0347 06/20/19 0507 06/20/19 0812  BP:  113/63  (!) 109/56  Pulse:  85  89  Resp:    16  Temp:  97.9 F (36.6 C)  98.3 F (36.8 C)  TempSrc:  Oral  Oral  SpO2:  94%  95%  Weight: 67.4 kg  67.4 kg   Height:         Intake/Output Summary (Last 24 hours) at 06/20/2019 1034 Last data filed at 06/20/2019 [id number] Gross per 24 hour  Intake 689.87 ml  Output 825 ml  Net -135.13 ml      PHYSICAL EXAM  General: Well developed, well nourished, in no acute distress HEENT:  Normocephalic and atramatic Neck:  No JVD, symmetrical  Lungs: Clear bilaterally to auscultation Heart: irregular HR and rhythm . Normal S1 and S2 without gallops or murmurs.  Abdomen: Bowel sounds are positive, abdomen soft and non-tender  Msk:  UTA; patient reports to be steady on her feet  Extremities: +2BLE edema, No clubbing or cyanosis  Neuro: Alert and oriented X 3. Psych:  Good affect, responds appropriately   LABS: Basic Metabolic Panel: Recent Labs    06/19/19 0548 06/20/19 0612  NA 142 141  K 3.4* 3.9  CL 103 104  CO2 30 28  GLUCOSE 86 90  BUN 30* 26*  CREATININE 1.50* 1.59*  CALCIUM 9.0 8.8*  MG 1.6* 1.9   Liver Function Tests: No results for input(s): AST, ALT, ALKPHOS, BILITOT, PROT, ALBUMIN in the last 72 hours. No results for input(s): LIPASE, AMYLASE in  the last 72 hours. CBC: Recent Labs    06/19/19 0548 06/20/19 0612  WBC 6.9 7.2  HGB 9.0* 8.7*  HCT 27.1* 26.3*  MCV 104.6* 106.9*  PLT 122* 120*   Cardiac Enzymes: No results for input(s): CKTOTAL, CKMB, CKMBINDEX, TROPONINI in the last 72 hours. BNP: Invalid input(s): POCBNP D-Dimer: No results for input(s): DDIMER in the last 72 hours. Hemoglobin A1C: No results for input(s): HGBA1C in the  last 72 hours. Fasting Lipid Panel: No results for input(s): CHOL, HDL, LDLCALC, TRIG, CHOLHDL, LDLDIRECT in the last 72 hours. Thyroid Function Tests: No results for input(s): TSH, T4TOTAL, T3FREE, THYROIDAB in the last 72 hours.  Invalid input(s): FREET3 Anemia Panel: No results for input(s): VITAMINB12, FOLATE, FERRITIN, TIBC, IRON, RETICCTPCT in the last 72 hours.  US Abdomen Limited  Result Date: 06/19/2019 CLINICAL DATA:  Renal failure, history of abdominal ascites EXAM: LIMITED ABDOMEN ULTRASOUND FOR ASCITES TECHNIQUE: Limited ultrasound survey for ascites was performed in all four abdominal quadrants. COMPARISON:  06/16/2019 FINDINGS: Survey ultrasound revealed only a small volume of ascites in the left lower quadrant. No large pocket for therapeutic paracentesis which was deferred after discussion with the patient, as there was no request for diagnostic labs from the fluid. IMPRESSION: 1. Small volume ascites.  Therapeutic paracentesis deferred. Electronically Signed   By: Lucrezia Europe M.D.   On: 06/19/2019 13:12     Echo: IMPRESSIONS  1. Left ventricular ejection fraction, by estimation, is 20 to 25%. The  left ventricle has severely decreased function. The left ventricle  demonstrates regional wall motion abnormalities (see scoring  diagram/findings for description). The left  ventricular internal cavity size was mildly to moderately dilated. Left  ventricular diastolic parameters were normal.  2. Right ventricular systolic function is mildly reduced. The right  ventricular size is mildly enlarged. There is mildly elevated pulmonary  artery systolic pressure.  3. Left atrial size was moderately dilated.  4. Right atrial size was moderately dilated.  5. The mitral valve is grossly normal. Moderate mitral valve  regurgitation.  6. Tricuspid valve regurgitation is moderate.  7. The aortic valve is grossly normal. Aortic valve regurgitation is not  visualized.     TELEMETRY: Atrial Fibrillation with HR=94 bpn  ASSESSMENT AND PLAN:  Principal Problem:   Acute on chronic systolic CHF (congestive heart failure) (HCC) Active Problems:   AKI (acute kidney injury) (HCC)   AF (paroxysmal atrial fibrillation) (HCC)   Hypothyroidism   CAD (coronary artery disease)   CHF (congestive heart failure), NYHA class I, acute on chronic, systolic (HCC)   Other cirrhosis of liver (HCC)   Ascites   Anemia  Hypokalemia  PLAN: -Acute on Chronic CHF with reduced EF of 20-25%, reasonably stable, patient noted to have +2 BLE  -Recommend starting Hydralazine 10mg  twice daily and Imdur 15mg  once daily for HFrEF  -Continue careful monitoring of bp due to episodes of hypotension   -Agree with diuresis with Lasix and spirolactone   -Continue current therapy for CHF   -Recommend BLE elevation, low sodium diet and compression socks    Acute on chronic renal insufficiency, improving, BUN=26/creat=1.59   -Agree with nephrology input  -Conitnue to monitor kidney functioning    -Multivessel coronary artery disease with stable angina, patient denies chest pain  -Continue conservative therapy at this time  -Consider statin therapy   Chronic atrial fibrillation rapid ventricular response, poor anticoagulation candidate  - Continue rate control with digoxin and metoprolol   - Continue with cardiac monitoring  continuously   Ascites, reasonably stable, therapeutic paracentesis scheduled for today was unable to be safely performed due to only a trace amount of peritoneal fluid noted on abdominal ultrasound.  -Continue current management   -Hypokalemia, resolved, K=3.9  -Agree with K+ replacement   -Continue to monitor BMP daily   Generalized weakness, reasonably stable  -Continue to increase activity to help with generalized weakness  Discussed with Dr. Clayborn Bigness who also evaluated the patient and the plan was made in collaboration with him.     Nyko Gell,  ACNPC-AG  06/20/2019 10:34 AM

## 2019-06-20 NOTE — Discharge Instructions (Signed)
Weakness Weakness is a lack of strength. You may feel weak all over your body (generalized), or you may feel weak in one specific part of your body (focal). Common causes of weakness include:  Infection and immune system disorders.  Physical exhaustion.  Internal bleeding or other blood loss that results in a lack of red blood cells (anemia).  Dehydration.  An imbalance in mineral (electrolyte) levels, such as potassium.  Heart disease, circulation problems, or stroke. Other causes include:  Some medicines or cancer treatment.  Stress, anxiety, or depression.  Nervous system disorders.  Thyroid disorders.  Loss of muscle strength because of age or inactivity.  Poor sleep quality or sleep disorders. The cause of your weakness may not be known. Some causes of weakness can be serious, so it is important to see your health care provider. Follow these instructions at home: Activity  Rest as needed.  Try to get enough sleep. Most adults need 7-8 hours of quality sleep each night. Talk to your health care provider about how much sleep you need each night.  Do exercises, such as arm curls and leg raises, for 30 minutes at least 2 days a week or as told by your health care provider. This helps build muscle strength.  Consider working with a physical therapist or trainer who can develop an exercise plan to help you gain muscle strength. General instructions   Take over-the-counter and prescription medicines only as told by your health care provider.  Eat a healthy, well-balanced diet. This includes: ? Proteins to build muscles, such as lean meats and fish. ? Fresh fruits and vegetables. ? Carbohydrates to boost energy, such as whole grains.  Drink enough fluid to keep your urine pale yellow.  Keep all follow-up visits as told by your health care provider. This is important. Contact a health care provider if your weakness:  Does not improve or gets worse.  Affects your  ability to think clearly.  Affects your ability to do your normal daily activities. Get help right away if you:  Develop sudden weakness, especially on one side of your face or body.  Have chest pain.  Have trouble breathing or shortness of breath.  Have problems with your vision.  Have trouble talking or swallowing.  Have trouble standing or walking.  Are light-headed or lose consciousness. Summary  Weakness is a lack of strength. You may feel weak all over your body or just in one specific part of your body.  Weakness can be caused by a variety of things. In some cases, the cause may be unknown.  Rest as needed, and try to get enough sleep. Most adults need 7-8 hours of quality sleep each night.  Eat a healthy, well-balanced diet. This information is not intended to replace advice given to you by your health care provider. Make sure you discuss any questions you have with your health care provider. Document Revised: 10/20/2017 Document Reviewed: 10/20/2017 Elsevier Patient Education  2020 [employer]  

## 2019-06-20 NOTE — Progress Notes (Signed)
[facility], [place] 06/20/19  Subjective:   Hospital day # 5 Patient underwent paracentesis on Friday when 3 L of fluid was removed Doing fair today Expecting to be discharged later today    Renal: 03/22 0701 - 03/23 0700 In: 989.9 [P.O.:900; IV Piggyback:89.9] Out: 1200 [Urine:1200] Lab Results  Component Value Date   CREATININE 1.59 (H) 06/20/2019   CREATININE 1.50 (H) 06/19/2019   CREATININE 1.83 (H) 06/18/2019     Objective:  Vital signs in last 24 hours:  Temp:  [97.9 F (36.6 C)-98.3 F (36.8 C)] 98 F (36.7 C) (03/23 1217) Pulse Rate:  [60-89] 65 (03/23 1217) Resp:  [16] 16 (03/23 1217) BP: (103-114)/(47-63) 114/55 (03/23 1217) SpO2:  [94 %-99 %] 99 % (03/23 1217) Weight:  [67.4 kg] 67.4 kg (03/23 0507)  Weight change: 0.635 kg Filed Weights   06/19/19 0254 06/20/19 0123 06/20/19 0507  Weight: 66.7 kg 67.4 kg 67.4 kg    Intake/Output:    Intake/Output Summary (Last 24 hours) at 06/20/2019 1453 Last data filed at 06/20/2019 1234 Gross per 24 hour  Intake 600 ml  Output 550 ml  Net 50 ml     Physical Exam: General:  No acute distress, laying in the  HEENT  head anicteric, moist oral mucous membrane  Pulm/lungs  normal breathing effort, clear to auscultate  CVS/Heart  no rub or gallop, irregular rhythm  Abdomen:   Soft, distended, ascites  Extremities:  1+ lower extremity edema over ankles  Neurologic:  Alert, oriented  Skin:  No acute rashes    Basic Metabolic Panel:  Recent Labs  Lab 06/16/19 0553 06/16/19 0553 06/17/19 0450 06/17/19 0450 06/18/19 0627 06/19/19 0548 06/20/19 0612  NA 135  --  137  --  138 142 141  K 4.1  --  3.2*  --  3.8 3.4* 3.9  CL 95*  --  98  --  99 103 104  CO2 29  --  29  --  29 30 28   GLUCOSE 98  --  83  --  97 86 90  BUN 38*  --  35*  --  33* 30* 26*  CREATININE 2.19*  --  1.95*  --  1.83* 1.50* 1.59*  CALCIUM 8.9   < > 8.7*   < > 8.9 9.0 8.8*  MG  --   --  1.8  --  1.7 1.6*  1.9   < > = values in this interval not displayed.     CBC: Recent Labs  Lab 06/15/19 1055 06/17/19 0450 06/18/19 0627 06/19/19 0548 06/20/19 0612  WBC 7.3 6.0 6.9 6.9 7.2  HGB 11.4* 8.7* 9.1* 9.0* 8.7*  HCT 32.9* 26.0* 27.2* 27.1* 26.3*  MCV 102.2* 104.8* 104.6* 104.6* 106.9*  PLT 159 117* 121* 122* 120*      Lab Results  Component Value Date   HEPBSAG NON REACTIVE 06/17/2019   HEPBSAB NON REACTIVE 06/17/2019   HEPBIGM NON REACTIVE 06/17/2019      Microbiology:  Recent Results (from the past 240 hour(s))  Respiratory Panel by RT PCR (Flu A&B, Covid) - Nasopharyngeal Swab     Status: None   Collection Time: 06/15/19  1:05 PM   Specimen: Nasopharyngeal Swab  Result Value Ref Range Status   SARS Coronavirus 2 by RT PCR NEGATIVE NEGATIVE Final    Comment: (NOTE) SARS-CoV-2 target nucleic acids are NOT DETECTED. The SARS-CoV-2 RNA is generally detectable in upper respiratoy specimens during the acute phase of infection. The  lowest concentration of SARS-CoV-2 viral copies this assay can detect is 131 copies/mL. A negative result does not preclude SARS-Cov-2 infection and should not be used as the sole basis for treatment or other patient management decisions. A negative result may occur with  improper specimen collection/handling, submission of specimen other than nasopharyngeal swab, presence of viral mutation(s) within the areas targeted by this assay, and inadequate number of viral copies (<131 copies/mL). A negative result must be combined with clinical observations, patient history, and epidemiological information. The expected result is Negative. Fact Sheet for Patients:  PinkCheek.be Fact Sheet for Healthcare Providers:  GravelBags.it This test is not yet ap proved or cleared by the [place] FDA and  has been authorized for detection and/or diagnosis of SARS-CoV-2 by FDA under an Emergency Use  Authorization (EUA). This EUA will remain  in effect (meaning this test can be used) for the duration of the COVID-19 declaration under Section 564(b)(1) of the Act, 21 U.S.C. section 360bbb-3(b)(1), unless the authorization is terminated or revoked sooner.    Influenza A by PCR NEGATIVE NEGATIVE Final   Influenza B by PCR NEGATIVE NEGATIVE Final    Comment: (NOTE) The Xpert Xpress SARS-CoV-2/FLU/RSV assay is intended as an aid in  the diagnosis of influenza from Nasopharyngeal swab specimens and  should not be used as a sole basis for treatment. Nasal washings and  aspirates are unacceptable for Xpert Xpress SARS-CoV-2/FLU/RSV  testing. Fact Sheet for Patients: PinkCheek.be Fact Sheet for Healthcare Providers: GravelBags.it This test is not yet approved or cleared by the [place] FDA and  has been authorized for detection and/or diagnosis of SARS-CoV-2 by  FDA under an Emergency Use Authorization (EUA). This EUA will remain  in effect (meaning this test can be used) for the duration of the  Covid-19 declaration under Section 564(b)(1) of the Act, 21  U.S.C. section 360bbb-3(b)(1), unless the authorization is  terminated or revoked. Performed at [facility], [address], [place], [place] [address]   Body fluid culture     Status: None   Collection Time: 06/16/19  2:42 PM   Specimen: PATH Cytology Peritoneal fluid  Result Value Ref Range Status   Specimen Description   Final    PERITONEAL Performed at [facility], [address] [place], [place] [address]    Special Requests   Final    NONE Performed at [facility], [address] [place], [place] [address]    Gram Stain   Final    FEW WBC PRESENT, PREDOMINANTLY MONONUCLEAR NO ORGANISMS SEEN Performed at [facility], [place] [address], [place], [place] [address]    Culture NO GROWTH  Final   Report Status 06/20/2019  FINAL  Final    Coagulation Studies: No results for input(s): LABPROT, INR in the last 72 hours.  Urinalysis: No results for input(s): COLORURINE, LABSPEC, PHURINE, GLUCOSEU, HGBUR, BILIRUBINUR, KETONESUR, PROTEINUR, UROBILINOGEN, NITRITE, LEUKOCYTESUR in the last 72 hours.  Invalid input(s): APPERANCEUR    Imaging: US Abdomen Limited  Result Date: 06/19/2019 CLINICAL DATA:  Renal failure, history of abdominal ascites EXAM: LIMITED ABDOMEN ULTRASOUND FOR ASCITES TECHNIQUE: Limited ultrasound survey for ascites was performed in all four abdominal quadrants. COMPARISON:  06/16/2019 FINDINGS: Survey ultrasound revealed only a small volume of ascites in the left lower quadrant. No large pocket for therapeutic paracentesis which was deferred after discussion with the patient, as there was no request for diagnostic labs from the fluid. IMPRESSION: 1. Small volume ascites.  Therapeutic paracentesis deferred. Electronically Signed   By: Lucrezia Europe M.D.   On: 06/19/2019 13:12     Medications:   . sodium chloride     . cholecalciferol  2,000 Units Oral Daily  . digoxin  0.0625 mg Oral Daily  . feeding supplement (ENSURE ENLIVE)  237 mL Oral BID BM  . furosemide  40 mg Oral Daily  . heparin  5,000 Units Subcutaneous Q8H  . hydrALAZINE  10 mg Oral BID  . isosorbide mononitrate  15 mg Oral Daily  . levothyroxine  75 mcg Oral Daily  . loratadine  10 mg Oral Daily  . metoprolol succinate  100 mg Oral BID  . multivitamin-lutein  1 capsule Oral Daily  . potassium chloride SA  20 mEq Oral Daily  . sodium chloride flush  3 mL Intravenous Q12H  . spironolactone  12.5 mg Oral Daily   sodium chloride, acetaminophen, fluticasone, ondansetron (ZOFRAN) IV, sodium chloride flush  Assessment/ Plan:  86 y.o. female with  systolic congestive heart failure, atrial fibrillation, hypertension, hypothyroidism, hepatic cirrhosis, coronary artery disease   admitted on 06/15/2019 for  Abdominal distention  [R14.0] Anasarca [R60.1] CHF (congestive heart failure), NYHA class I, acute on chronic, systolic (HCC) [id number] Acute on chronic congestive heart failure, unspecified heart failure type ([place]) [I50.9]   1. Acute renal failure on chronic kidney disease stage IIIB: baseline creatinine of 1.4, GFR of 36 on 04/13/2019.  No IV contrast exposure Chronic Kidney Disease most likely due to hypertensive nephrosclerosis Renal U.S neg for obstruction. Stable exophytic cysts AKI likely secondary to cardiorenal syndrome. Serum creatinine is improving slowly. We will follow-up with her as outpatient in next 2 to 3 weeks  2. Acute exacerbation of ch sys CHF and LE edema Monitor volume status closely.  Avoid hypotension Patient is being discharged on furosemide 40 mg daily and Aldactone 12.5 mg daily Discussed with patient to follow low-salt diet  3. Cirrhosis with ascites 3000 cc removed with paracentesis 3/19 Avoid hypotension Continue midodrine  4.  Hypokalemia Likely induced by diuresis Potassium supplementation as needed    LOS: [place] 3/23/20212:53 PM  [place], [place]  Note: This note was prepared with Dragon dictation. Any transcription errors are unintentional

## 2019-06-20 NOTE — Discharge Summary (Signed)
Physician Discharge Summary   [name]  female DOB: [birth year]  [id number]  PCP: Adin Hector, MD  Admit date: 06/15/2019 Discharge date: 06/20/2019  Admitted From: home Disposition:  home Home Health: Yes CODE STATUS: Full code  Discharge Instructions    Diet - low sodium heart healthy   Complete by: As directed    Discharge instructions   Complete by: As directed    Please take Lasix 40 mg daily and Aldactone 12.5 mg daily to help prevent accumulation of ascites.    Since your blood pressure has been on the low side, I am decreasing your Toprol 100 mg to once a day, and you have a new Rx for digoxin which is also to control your heart rate.  Please follow up with your cardiologist as outpatient.   Dr. Enzo Bi - -   Increase activity slowly   Complete by: As directed        Hospital Course:  For full details, please see H&P, progress notes, consult notes and ancillary notes.  Briefly,  Desiree Koch a 86 y.o. Caucasianfemalewith medical history significant foratrial fibrillation not on anticoagulation due to history of bleeding,chronic systolic heart failure with last known LVEF of 20%,hypothyroidism, HTN,liver cirrhosis and coronary artery disease.Patient was brought to the emergency room by her daughter for evaluation of worsening lower extremity swelling and increasing abdominal girth.   Acute on chronic systolicCHF, LVEF [id number] Pt presented with shortness of breath with exertion, 2 pilloworthopnea and bilateral lower extremity swelling, 10 pound weight gain.  Home regimen of torsemide 40mg  daily, reportedly not producing much urine.  Patient's last 2D Echocardiogram showed an LVEF of 20%.  Repeat similar.  Pt was diuresed with Lasix 40 mg IV every 12, per nephrology, with good response.  Pt was transitioned to Lasix 40 mg and Aldactone 12.5 mg daily prior to discharge.  Pt was maintained on low sodium diet and fluid restriction.  Home  Toprol 100 mg was continued at once a day (instead of BID PTA) due to soft blood pressure.  Cardiology recommended starting Hydralazine 10mg  twice daily and Imdur 15mg  once daily on the day of discharge, however since pt's blood pressure was soft (100's-110's), and already had to be on diuretic and beta blocker, decision was made to defer starting hydralazine and Imdur later at the outpatient followup, after pt has stabilized on her new diuretic regimen.  Paroxysmal atrial fibrillation Patient is not on long-term anticoagulation due to history of bleeding.  Due to low BP, home Toprol was reduced to daily, and digoxin was added, per cards rec.  Acute renal failure on chronic kidney disease stage IIIB, improved Cr 2.38 on presentation.  baseline creatinine of 1.4.  Renal U.S neg for obstruction and stable exophytic cysts.  Not on an ARB.  Has history of ACE-I due to cough.  Nephrology was consulted.  Cr improved with diuresis and was 1.59 on the day of discharge.  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  Never had paracentesis before.  No abdominal pain, fever, or leukocytosis to suggest SBP. Paracentesis 3/19 with 3L removed (a lot more left, but want to avoid causing hepatorenal syndrome).  Pt received IV albumin 25 g BID per nephrology rec.  Another paracentesis attempted on 3/22, however, pt's ascites had apparently improved just on diuretics as there was no drainable fluid collection.  Pt was discharged on PO Lasix 40 mg and Aldactone 12.5 mg daily to prevent ascites accumulation.  Hypokalemia and hypomag Likely induced by diuresis Potassium and mag supplementation as needed.  Anemia with chronic kidney disease: macrocytic Iron studies and anemia labs all wnl.  Hypothyroidism Continued Synthroid  History of coronary artery disease, Stable Continued aspirin and beta-blocker.   Discharge Diagnoses:  Principal Problem:   Acute on chronic  systolic CHF (congestive heart failure) (HCC) Active Problems:   AKI (acute kidney injury) (HCC)   AF (paroxysmal atrial fibrillation) (HCC)   Hypothyroidism   CAD (coronary artery disease)   CHF (congestive heart failure), NYHA class I, acute on chronic, systolic (HCC)   Other cirrhosis of liver (HCC)   Ascites   Anemia    Discharge Instructions:  Allergies as of 06/20/2019      Reactions   Amlodipine Swelling   Clonidine Derivatives Swelling   Norvasc [amlodipine Besylate] Swelling   Aspirin Other (See Comments)   Blood pressure goes up   Celebrex [celecoxib] Other (See Comments)   Blood pressure goes up   Codeine Swelling   Propoxyphene Swelling   Latex Other (See Comments)   NEGATIVE RASH TEST 09/06/15   Penicillins Other (See Comments)   Patient does not want medication Did it involve swelling of the face/tongue/throat, SOB, or low BP? Unknown Did it involve sudden or severe rash/hives, skin peeling, or any reaction on the inside of your mouth or nose? Unknown Did you need to seek medical attention at a hospital or doctor's office? Unknown When did it last happen?childhood If all above answers are "NO", may proceed with cephalosporin use.   Ace Inhibitors Cough   Ciprofloxacin Palpitations   Flexeril [cyclobenzaprine] Other (See Comments)   Blood pressure goes up   Hctz [hydrochlorothiazide] Other (See Comments)   Unknown   Lisinopril Other (See Comments)   Susceptible to sun   Meperidine And Related Other (See Comments)   Unknown   Pravastatin Rash   Sulfa Antibiotics Rash      Medication List    STOP taking these medications   torsemide 20 MG tablet Commonly known as: DEMADEX     TAKE these medications   acetaminophen 325 MG tablet Commonly known as: TYLENOL Take 325-650 mg by mouth every 6 (six) hours as needed for mild pain or moderate pain.   cholecalciferol 25 MCG (1000 UNIT) tablet Commonly known as: VITAMIN D3 Take 2,000 Units by mouth  daily.   Digoxin 62.5 MCG Tabs Take 0.0625 mg by mouth daily. Start taking on: June 21, 2019   fexofenadine 180 MG tablet Commonly known as: ALLEGRA Take 180 mg by mouth at bedtime.   fluticasone 50 MCG/ACT nasal spray Commonly known as: FLONASE Place 2 sprays into both nostrils daily as needed for rhinitis.   furosemide 40 MG tablet Commonly known as: LASIX Take 1 tablet (40 mg total) by mouth daily. Start taking on: June 21, 2019   Glucosamine 500 MG Caps Take 500 mg by mouth daily.   levothyroxine 75 MCG tablet Commonly known as: SYNTHROID Take 75 mcg by mouth daily.   metoprolol succinate 100 MG 24 hr tablet Commonly known as: TOPROL-XL Take 1 tablet (100 mg total) by mouth daily. What changed: when to take this   potassium chloride SA 20 MEQ tablet Commonly known as: KLOR-CON Take 20 mEq by mouth daily.   PRESERVISION AREDS 2+MULTI VIT PO Take 2 tablets by mouth daily.   spironolactone 25 MG tablet Commonly known as: ALDACTONE Take 0.5 tablets (12.5 mg total) by mouth daily. Start taking on: June 21, 2019  Follow-up Information    [facility] Follow up on 06/27/2019.   Specialty: Cardiology Why: at 2:00pm. Enter through the [facility] entrance Contact information: [place] [phone]       Tama High III, MD. Schedule an appointment as soon as possible for a visit in 1 week(s).   Specialty: Internal Medicine Contact information: [address] [facility] [place] [address] [phone]           Allergies  Allergen Reactions  . Amlodipine Swelling  . Clonidine Derivatives Swelling  . Norvasc [Amlodipine Besylate] Swelling  . Aspirin Other (See Comments)    Blood pressure goes up  . Celebrex [Celecoxib] Other (See Comments)    Blood pressure goes up  . Codeine Swelling  . Propoxyphene Swelling  . Latex  Other (See Comments)    NEGATIVE RASH TEST 09/06/15  . Penicillins Other (See Comments)    Patient does not want medication Did it involve swelling of the face/tongue/throat, SOB, or low BP? Unknown Did it involve sudden or severe rash/hives, skin peeling, or any reaction on the inside of your mouth or nose? Unknown Did you need to seek medical attention at a hospital or doctor's office? Unknown When did it last happen?childhood If all above answers are "NO", may proceed with cephalosporin use.  . Ace Inhibitors Cough  . Ciprofloxacin Palpitations  . Flexeril [Cyclobenzaprine] Other (See Comments)    Blood pressure goes up  . Hctz [Hydrochlorothiazide] Other (See Comments)    Unknown  . Lisinopril Other (See Comments)    Susceptible to sun  . Meperidine And Related Other (See Comments)    Unknown  . Pravastatin Rash  . Sulfa Antibiotics Rash     The results of significant diagnostics from this hospitalization (including imaging, microbiology, ancillary and laboratory) are listed below for reference.   Consultations:   Procedures/Studies: US RENAL  Result Date: 06/16/2019 CLINICAL DATA:  Acute kidney failure.  Chronic kidney disease. EXAM: RENAL / URINARY TRACT ULTRASOUND COMPLETE COMPARISON:  07/29/2018, ultrasound 02/22/2017 FINDINGS: Right Kidney: Renal measurements: 9.4 x 4.4 x 4.2 centimeters = volume: 90 mL. On previous ultrasound 02/22/2017, the RIGHT kidney measured 10.1 centimeters. Two small exophytic lesions are identified in the midpole region, measuring 0.9 and 1.2 centimeters. These lesions likely represent cysts given stability since 2018. Left Kidney: Renal measurements: 8.4 x 4.7 x 4.1 centimeters = volume: 86 mL. On previous ultrasound 02/22/2017, the LEFT kidney measured 10.0 centimeters. Echogenicity within normal limits. No mass or hydronephrosis visualized. Bladder: Decompressed Other: None. IMPRESSION: Kidneys are smaller compared to prior studies.  No  hydronephrosis. Stable exophytic RIGHT renal cysts. Electronically Signed   By: Nolon Nations M.D.   On: 06/16/2019 14:45   US Abdomen Limited  Result Date: 06/19/2019 CLINICAL DATA:  Renal failure, history of abdominal ascites EXAM: LIMITED ABDOMEN ULTRASOUND FOR ASCITES TECHNIQUE: Limited ultrasound survey for ascites was performed in all four abdominal quadrants. COMPARISON:  06/16/2019 FINDINGS: Survey ultrasound revealed only a small volume of ascites in the left lower quadrant. No large pocket for therapeutic paracentesis which was deferred after discussion with the patient, as there was no request for diagnostic labs from the fluid. IMPRESSION: 1. Small volume ascites.  Therapeutic paracentesis deferred. Electronically Signed   By: Lucrezia Europe M.D.   On: 06/19/2019 13:12   US Paracentesis  Result Date: 06/16/2019 INDICATION: Acute renal failure with ascites. EXAM: ULTRASOUND GUIDED  PARACENTESIS MEDICATIONS: None. COMPLICATIONS: None immediate. PROCEDURE: Informed written consent was obtained from the patient after a discussion of the risks, benefits and alternatives to treatment. A timeout was performed prior to the initiation of the procedure. Initial ultrasound scanning demonstrates a large amount of ascites within the left lower abdominal quadrant. The left lower abdomen was prepped and draped in the usual sterile fashion. 1% lidocaine was used for local anesthesia. Following this, a 6 Fr Safe-T-Centesis catheter was introduced. An ultrasound image was saved for documentation purposes. The paracentesis was performed. The catheter was removed and a dressing was applied. The patient tolerated the procedure well without immediate post procedural complication. FINDINGS: A total of approximately 3 L of pink colored fluid was removed. Samples were sent to the laboratory as requested by the clinical team. Max volume to be removed was 3 L and there was residual fluid after the paracentesis. IMPRESSION:  Successful ultrasound-guided paracentesis yielding 3 liters of peritoneal fluid. Electronically Signed   By: Markus Daft M.D.   On: 06/16/2019 14:51   DG Chest Portable 1 View  Result Date: 06/15/2019 CLINICAL DATA:  Shortness of breath. EXAM: PORTABLE CHEST 1 VIEW COMPARISON:  June 16, 2017. FINDINGS: Stable cardiomegaly. No pneumothorax or significant pleural effusion is noted. Stable mild interstitial densities are noted throughout both lungs which may represent scarring, but acute superimposed edema or inflammation cannot be excluded. Bony thorax is unremarkable. IMPRESSION: Stable mild interstitial densities are noted throughout both lungs which may represent scarring, but acute superimposed edema or inflammation cannot be excluded. Electronically Signed   By: Marijo Conception M.D.   On: 06/15/2019 11:53   ECHOCARDIOGRAM COMPLETE  Result Date: 06/16/2019    ECHOCARDIOGRAM REPORT   Patient Name:   Desiree Koch Date of Exam: 06/16/2019 Medical Rec #:  [id number]       Height:       61.0 in Accession #:    [id number]      Weight:       153.0 lb Date of Birth:  March 07, 1931      BSA:          1.686 m Patient Age:    6 years        BP:           108/70 mmHg Patient Gender: F               HR:           99 bpm. Exam Location:  [facility] Procedure: 2D Echo, Color Doppler and Cardiac Doppler Indications:     CHF- acute diastolic [id number]  History:         Patient has prior history of Echocardiogram examinations, most                  recent 06/17/2017. CAD; Risk Factors:Hypertension. Pneumonia.  Sonographer:     Sherrie Sport RDCS (AE) Referring Phys:  [id number] Collier Bullock Diagnosing Phys: Yolonda Kida MD IMPRESSIONS  1. Left ventricular ejection fraction, by estimation, is 20 to 25%. The left ventricle has severely decreased function. The left ventricle demonstrates regional wall motion abnormalities (see scoring diagram/findings for description). The left ventricular internal cavity size was mildly to moderately  dilated. Left ventricular diastolic parameters were normal.  2. Right ventricular systolic function is mildly reduced. The right ventricular size is mildly enlarged. There is mildly elevated pulmonary artery systolic pressure.  3. Left atrial size was moderately dilated.  4. Right atrial size was moderately  dilated.  5. The mitral valve is grossly normal. Moderate mitral valve regurgitation.  6. Tricuspid valve regurgitation is moderate.  7. The aortic valve is grossly normal. Aortic valve regurgitation is not visualized. FINDINGS  Left Ventricle: Left ventricular ejection fraction, by estimation, is 20 to 25%. The left ventricle has severely decreased function. The left ventricle demonstrates regional wall motion abnormalities. The left ventricular internal cavity size was mildly  to moderately dilated. There is no left ventricular hypertrophy. Abnormal (paradoxical) septal motion, consistent with left bundle branch block. Left ventricular diastolic parameters were normal. Right Ventricle: The right ventricular size is mildly enlarged. No increase in right ventricular wall thickness. Right ventricular systolic function is mildly reduced. There is mildly elevated pulmonary artery systolic pressure. The tricuspid regurgitant  velocity is 2.61 m/s, and with an assumed right atrial pressure of 10 mmHg, the estimated right ventricular systolic pressure is 0000000 mmHg. Left Atrium: Left atrial size was moderately dilated. Right Atrium: Right atrial size was moderately dilated. Pericardium: There is no evidence of pericardial effusion. Mitral Valve: The mitral valve is grossly normal. Moderate mitral valve regurgitation. Tricuspid Valve: The tricuspid valve is grossly normal. Tricuspid valve regurgitation is moderate. Aortic Valve: The aortic valve is grossly normal. Aortic valve regurgitation is not visualized. Aortic valve mean gradient measures 2.2 mmHg. Aortic valve peak gradient measures 3.6 mmHg. Aortic valve area, by  VTI measures 1.92 cm. Pulmonic Valve: The pulmonic valve was normal in structure. Pulmonic valve regurgitation is not visualized. Aorta: The aortic root is normal in size and structure. IAS/Shunts: No atrial level shunt detected by color flow Doppler.  LEFT VENTRICLE PLAX 2D LVIDd:         3.62 cm LVIDs:         3.33 cm LV PW:         1.16 cm LV IVS:        1.33 cm LVOT diam:     2.00 cm LV SV:         28 LV SV Index:   17 LVOT Area:     3.14 cm  LV Volumes (MOD) LV vol d, MOD A2C: 71.3 ml LV vol d, MOD A4C: 74.8 ml LV vol s, MOD A2C: 47.9 ml LV vol s, MOD A4C: 58.6 ml LV SV MOD A2C:     23.4 ml LV SV MOD A4C:     74.8 ml LV SV MOD BP:      20.8 ml RIGHT VENTRICLE RV S prime:     12.60 cm/s TAPSE (M-mode): 2.7 cm LEFT ATRIUM             Index       RIGHT ATRIUM           Index LA diam:        4.90 cm 2.91 cm/m  RA Area:     19.20 cm LA Vol (A2C):   72.8 ml 43.19 ml/m RA Volume:   51.30 ml  30.44 ml/m LA Vol (A4C):   81.9 ml 48.59 ml/m LA Biplane Vol: 83.1 ml 49.30 ml/m  AORTIC VALVE                   PULMONIC VALVE AV Area (Vmax):    1.81 cm    PV Vmax:        0.49 m/s AV Area (Vmean):   1.65 cm    PV Peak grad:   1.0 mmHg AV Area (VTI):     1.92 cm  RVOT Peak grad: 2 mmHg AV Vmax:           95.05 cm/s AV Vmean:          69.900 cm/s AV VTI:            0.147 m AV Peak Grad:      3.6 mmHg AV Mean Grad:      2.2 mmHg LVOT Vmax:         54.70 cm/s LVOT Vmean:        36.800 cm/s LVOT VTI:          0.090 m LVOT/AV VTI ratio: 0.61  AORTA Ao Root diam: 2.50 cm MITRAL VALVE               TRICUSPID VALVE MV Area (PHT): 5.13 cm    TR Peak grad:   27.2 mmHg MV Decel Time: 148 msec    TR Vmax:        261.00 cm/s MV E velocity: 92.20 cm/s                            SHUNTS                            Systemic VTI:  0.09 m                            Systemic Diam: 2.00 cm Dwayne Prince Rome MD Electronically signed by Yolonda Kida MD Signature Date/Time: 06/16/2019/1:26:31 PM    Final    [place] ASCITES (ABDOMEN  LIMITED)  Result Date: 06/15/2019 CLINICAL DATA:  Abdominal distension for 1 month EXAM: LIMITED ABDOMEN ULTRASOUND FOR ASCITES TECHNIQUE: Limited ultrasound survey for ascites was performed in all four abdominal quadrants. COMPARISON:  07/29/2018 FINDINGS: Scattered ascites identified in all 4 quadrants, greater in lower quadrants. RIGHT pleural effusion also identified. IMPRESSION: Ascites throughout abdomen as well as RIGHT pleural effusion. Electronically Signed   By: Lavonia Dana M.D.   On: 06/15/2019 15:19      Labs: BNP (last 3 results) Recent Labs    06/15/19 1055  BNP [id number]*   Basic Metabolic Panel: Recent Labs  Lab 06/16/19 0553 06/17/19 0450 06/18/19 0627 06/19/19 0548 06/20/19 0612  NA 135 137 138 142 141  K 4.1 3.2* 3.8 3.4* 3.9  CL 95* 98 99 103 104  CO2 29 29 29 30 28   GLUCOSE 98 83 97 86 90  BUN 38* 35* 33* 30* 26*  CREATININE 2.19* 1.95* 1.83* 1.50* 1.59*  CALCIUM 8.9 8.7* 8.9 9.0 8.8*  MG  --  1.8 1.7 1.6* 1.9   Liver Function Tests: Recent Labs  Lab 06/15/19 1055  AST 54*  ALT 14  ALKPHOS 79  BILITOT 2.7*  PROT 6.2*  ALBUMIN 2.5*   No results for input(s): LIPASE, AMYLASE in the last 168 hours. No results for input(s): AMMONIA in the last 168 hours. CBC: Recent Labs  Lab 06/15/19 1055 06/17/19 0450 06/18/19 0627 06/19/19 0548 06/20/19 0612  WBC 7.3 6.0 6.9 6.9 7.2  HGB 11.4* 8.7* 9.1* 9.0* 8.7*  HCT 32.9* 26.0* 27.2* 27.1* 26.3*  MCV 102.2* 104.8* 104.6* 104.6* 106.9*  PLT 159 117* 121* 122* 120*   Cardiac Enzymes: No results for input(s): CKTOTAL, CKMB, CKMBINDEX, TROPONINI in the last 168 hours. BNP: Invalid input(s): POCBNP CBG: No results for input(s): GLUCAP in the last  168 hours. D-Dimer No results for input(s): DDIMER in the last 72 hours. Hgb A1c No results for input(s): HGBA1C in the last 72 hours. Lipid Profile No results for input(s): CHOL, HDL, LDLCALC, TRIG, CHOLHDL, LDLDIRECT in the last 72 hours. Thyroid function  studies No results for input(s): TSH, T4TOTAL, T3FREE, THYROIDAB in the last 72 hours.  Invalid input(s): FREET3 Anemia work up No results for input(s): VITAMINB12, FOLATE, FERRITIN, TIBC, IRON, RETICCTPCT in the last 72 hours. Urinalysis    Component Value Date/Time   COLORURINE YELLOW (A) 06/16/2017 1930   APPEARANCEUR CLOUDY (A) 06/16/2017 1930   LABSPEC 1.004 (L) 06/16/2017 1930   PHURINE 5.0 06/16/2017 1930   GLUCOSEU NEGATIVE 06/16/2017 1930   HGBUR NEGATIVE 06/16/2017 1930   BILIRUBINUR NEGATIVE 06/16/2017 1930   KETONESUR NEGATIVE 06/16/2017 1930   PROTEINUR NEGATIVE 06/16/2017 1930   NITRITE NEGATIVE 06/16/2017 1930   LEUKOCYTESUR LARGE (A) 06/16/2017 1930   Sepsis Labs Invalid input(s): PROCALCITONIN,  WBC,  LACTICIDVEN Microbiology Recent Results (from the past 240 hour(s))  Respiratory Panel by RT PCR (Flu A&B, Covid) - Nasopharyngeal Swab     Status: None   Collection Time: 06/15/19  1:05 PM   Specimen: Nasopharyngeal Swab  Result Value Ref Range Status   SARS Coronavirus 2 by RT PCR NEGATIVE NEGATIVE Final    Comment: (NOTE) SARS-CoV-2 target nucleic acids are NOT DETECTED. The SARS-CoV-2 RNA is generally detectable in upper respiratoy specimens during the acute phase of infection. The lowest concentration of SARS-CoV-2 viral copies this assay can detect is 131 copies/mL. A negative result does not preclude SARS-Cov-2 infection and should not be used as the sole basis for treatment or other patient management decisions. A negative result may occur with  improper specimen collection/handling, submission of specimen other than nasopharyngeal swab, presence of viral mutation(s) within the areas targeted by this assay, and inadequate number of viral copies (<131 copies/mL). A negative result must be combined with clinical observations, patient history, and epidemiological information. The expected result is Negative. Fact Sheet for Patients:    PinkCheek.be Fact Sheet for Healthcare Providers:  GravelBags.it This test is not yet ap proved or cleared by the [place] FDA and  has been authorized for detection and/or diagnosis of SARS-CoV-2 by FDA under an Emergency Use Authorization (EUA). This EUA will remain  in effect (meaning this test can be used) for the duration of the COVID-19 declaration under Section 564(b)(1) of the Act, 21 U.S.C. section 360bbb-3(b)(1), unless the authorization is terminated or revoked sooner.    Influenza A by PCR NEGATIVE NEGATIVE Final   Influenza B by PCR NEGATIVE NEGATIVE Final    Comment: (NOTE) The Xpert Xpress SARS-CoV-2/FLU/RSV assay is intended as an aid in  the diagnosis of influenza from Nasopharyngeal swab specimens and  should not be used as a sole basis for treatment. Nasal washings and  aspirates are unacceptable for Xpert Xpress SARS-CoV-2/FLU/RSV  testing. Fact Sheet for Patients: PinkCheek.be Fact Sheet for Healthcare Providers: GravelBags.it This test is not yet approved or cleared by the [place] FDA and  has been authorized for detection and/or diagnosis of SARS-CoV-2 by  FDA under an Emergency Use Authorization (EUA). This EUA will remain  in effect (meaning this test can be used) for the duration of the  Covid-19 declaration under Section 564(b)(1) of the Act, 21  U.S.C. section 360bbb-3(b)(1), unless the authorization is  terminated or revoked. Performed at [facility], [address], [place], [place] [address]   Body  fluid culture     Status: None   Collection Time: 06/16/19  2:42 PM   Specimen: PATH Cytology Peritoneal fluid  Result Value Ref Range Status   Specimen Description   Final    PERITONEAL Performed at [facility], [address], [place], [place] [address]    Special Requests   Final    NONE Performed at  [facility], [address] [place], [place] [address]    Gram Stain   Final    FEW WBC PRESENT, PREDOMINANTLY MONONUCLEAR NO ORGANISMS SEEN Performed at [facility], [place] [address], [place], [place] [address]    Culture NO GROWTH  Final   Report Status 06/20/2019 FINAL  Final     Total time spend on discharging this patient, including the last patient exam, discussing the hospital stay, instructions for ongoing care as it relates to all pertinent caregivers, as well as preparing the medical discharge records, prescriptions, and/or referrals as applicable, is 45 minutes.    Enzo Bi, MD  [facility] 06/20/2019, 10:47 AM  If 7PM-7AM, please contact night-coverage

## 2019-06-20 NOTE — Progress Notes (Signed)
Discharging physician did not agree with recommendations to start Hydralazine and Imdur upon discharge at this time. I have personally called the patient's pharmacy [facility]) to cancel these prescriptions and it was verified by [facility], [facility].

## 2019-06-23 ENCOUNTER — Telehealth: Payer: Self-pay | Admitting: Family

## 2019-06-23 NOTE — Telephone Encounter (Signed)
LVM with patient regarding new patient CHF Clinic appointment scheduled for 3/30 and to see how patient is doing since she got out of hospital.    Alyse Low, NT

## 2019-06-27 ENCOUNTER — Other Ambulatory Visit: Payer: Self-pay | Admitting: Internal Medicine

## 2019-06-27 ENCOUNTER — Ambulatory Visit: Payer: Medicare HMO | Admitting: Family

## 2019-06-27 ENCOUNTER — Ambulatory Visit
Admission: RE | Admit: 2019-06-27 | Discharge: 2019-06-27 | Disposition: A | Discharge status: Home / Self Care | Payer: Medicare HMO | Source: Ambulatory Visit | Attending: Internal Medicine | Admitting: Internal Medicine

## 2019-06-27 ENCOUNTER — Other Ambulatory Visit: Payer: Self-pay

## 2019-06-27 DIAGNOSIS — N320 Bladder-neck obstruction: Secondary | ICD-10-CM

## 2019-06-27 DIAGNOSIS — I5022 Chronic systolic (congestive) heart failure: Secondary | ICD-10-CM

## 2019-06-27 DIAGNOSIS — R188 Other ascites: Secondary | ICD-10-CM | POA: Diagnosis present

## 2019-06-27 NOTE — Procedures (Signed)
PROCEDURE SUMMARY:  Successful image-guided paracentesis from the left lower abdomen.  Yielded 4.0 liters of hazy pink fluid which was the requested maximum - small amount of residual fluid remains on post procedure US examination.  No immediate complications.  EBL: zero Patient tolerated well.   Specimen was not sent for labs.  Please see imaging section of Epic for full dictation.  Joaquim Nam PA-C 06/27/2019 1:30 PM

## 2019-07-04 ENCOUNTER — Emergency Department: Payer: Medicare HMO

## 2019-07-04 ENCOUNTER — Encounter: Payer: Self-pay | Admitting: Emergency Medicine

## 2019-07-04 ENCOUNTER — Other Ambulatory Visit: Payer: Self-pay

## 2019-07-04 ENCOUNTER — Inpatient Hospital Stay
Admission: EM | Admit: 2019-07-04 | Discharge: 2019-07-13 | DRG: 640 | Disposition: A | Discharge status: Hospice | Payer: Medicare HMO | Attending: Internal Medicine | Admitting: Internal Medicine

## 2019-07-04 DIAGNOSIS — E890 Postprocedural hypothyroidism: Secondary | ICD-10-CM | POA: Diagnosis present

## 2019-07-04 DIAGNOSIS — I447 Left bundle-branch block, unspecified: Secondary | ICD-10-CM | POA: Diagnosis present

## 2019-07-04 DIAGNOSIS — I130 Hypertensive heart and chronic kidney disease with heart failure and stage 1 through stage 4 chronic kidney disease, or unspecified chronic kidney disease: Secondary | ICD-10-CM | POA: Diagnosis present

## 2019-07-04 DIAGNOSIS — N189 Chronic kidney disease, unspecified: Secondary | ICD-10-CM

## 2019-07-04 DIAGNOSIS — E039 Hypothyroidism, unspecified: Secondary | ICD-10-CM | POA: Diagnosis present

## 2019-07-04 DIAGNOSIS — Z9842 Cataract extraction status, left eye: Secondary | ICD-10-CM

## 2019-07-04 DIAGNOSIS — Z961 Presence of intraocular lens: Secondary | ICD-10-CM | POA: Diagnosis present

## 2019-07-04 DIAGNOSIS — N1832 Chronic kidney disease, stage 3b: Secondary | ICD-10-CM | POA: Diagnosis present

## 2019-07-04 DIAGNOSIS — I4892 Unspecified atrial flutter: Secondary | ICD-10-CM | POA: Diagnosis present

## 2019-07-04 DIAGNOSIS — I472 Ventricular tachycardia: Secondary | ICD-10-CM | POA: Diagnosis not present

## 2019-07-04 DIAGNOSIS — Z9049 Acquired absence of other specified parts of digestive tract: Secondary | ICD-10-CM

## 2019-07-04 DIAGNOSIS — D539 Nutritional anemia, unspecified: Secondary | ICD-10-CM | POA: Diagnosis present

## 2019-07-04 DIAGNOSIS — I493 Ventricular premature depolarization: Secondary | ICD-10-CM

## 2019-07-04 DIAGNOSIS — I480 Paroxysmal atrial fibrillation: Secondary | ICD-10-CM | POA: Diagnosis present

## 2019-07-04 DIAGNOSIS — Z515 Encounter for palliative care: Secondary | ICD-10-CM | POA: Diagnosis not present

## 2019-07-04 DIAGNOSIS — E871 Hypo-osmolality and hyponatremia: Secondary | ICD-10-CM | POA: Diagnosis not present

## 2019-07-04 DIAGNOSIS — K767 Hepatorenal syndrome: Secondary | ICD-10-CM | POA: Diagnosis present

## 2019-07-04 DIAGNOSIS — R140 Abdominal distension (gaseous): Secondary | ICD-10-CM

## 2019-07-04 DIAGNOSIS — Z20822 Contact with and (suspected) exposure to covid-19: Secondary | ICD-10-CM | POA: Diagnosis present

## 2019-07-04 DIAGNOSIS — E875 Hyperkalemia: Secondary | ICD-10-CM | POA: Diagnosis not present

## 2019-07-04 DIAGNOSIS — I272 Pulmonary hypertension, unspecified: Secondary | ICD-10-CM | POA: Diagnosis present

## 2019-07-04 DIAGNOSIS — Z888 Allergy status to other drugs, medicaments and biological substances status: Secondary | ICD-10-CM

## 2019-07-04 DIAGNOSIS — Z91040 Latex allergy status: Secondary | ICD-10-CM

## 2019-07-04 DIAGNOSIS — N170 Acute kidney failure with tubular necrosis: Secondary | ICD-10-CM | POA: Diagnosis present

## 2019-07-04 DIAGNOSIS — D6959 Other secondary thrombocytopenia: Secondary | ICD-10-CM | POA: Diagnosis not present

## 2019-07-04 DIAGNOSIS — Z79899 Other long term (current) drug therapy: Secondary | ICD-10-CM

## 2019-07-04 DIAGNOSIS — I2510 Atherosclerotic heart disease of native coronary artery without angina pectoris: Secondary | ICD-10-CM | POA: Diagnosis present

## 2019-07-04 DIAGNOSIS — I10 Essential (primary) hypertension: Secondary | ICD-10-CM

## 2019-07-04 DIAGNOSIS — N179 Acute kidney failure, unspecified: Secondary | ICD-10-CM

## 2019-07-04 DIAGNOSIS — H9190 Unspecified hearing loss, unspecified ear: Secondary | ICD-10-CM | POA: Diagnosis present

## 2019-07-04 DIAGNOSIS — Z85828 Personal history of other malignant neoplasm of skin: Secondary | ICD-10-CM

## 2019-07-04 DIAGNOSIS — I5043 Acute on chronic combined systolic (congestive) and diastolic (congestive) heart failure: Secondary | ICD-10-CM | POA: Diagnosis present

## 2019-07-04 DIAGNOSIS — Z886 Allergy status to analgesic agent status: Secondary | ICD-10-CM

## 2019-07-04 DIAGNOSIS — T502X5A Adverse effect of carbonic-anhydrase inhibitors, benzothiadiazides and other diuretics, initial encounter: Secondary | ICD-10-CM | POA: Diagnosis not present

## 2019-07-04 DIAGNOSIS — I252 Old myocardial infarction: Secondary | ICD-10-CM

## 2019-07-04 DIAGNOSIS — R110 Nausea: Secondary | ICD-10-CM | POA: Diagnosis not present

## 2019-07-04 DIAGNOSIS — Z885 Allergy status to narcotic agent status: Secondary | ICD-10-CM

## 2019-07-04 DIAGNOSIS — Z79890 Hormone replacement therapy (postmenopausal): Secondary | ICD-10-CM

## 2019-07-04 DIAGNOSIS — R188 Other ascites: Secondary | ICD-10-CM | POA: Diagnosis present

## 2019-07-04 DIAGNOSIS — Z8249 Family history of ischemic heart disease and other diseases of the circulatory system: Secondary | ICD-10-CM

## 2019-07-04 DIAGNOSIS — K7469 Other cirrhosis of liver: Secondary | ICD-10-CM | POA: Diagnosis present

## 2019-07-04 DIAGNOSIS — E7800 Pure hypercholesterolemia, unspecified: Secondary | ICD-10-CM | POA: Diagnosis present

## 2019-07-04 DIAGNOSIS — Z882 Allergy status to sulfonamides status: Secondary | ICD-10-CM

## 2019-07-04 DIAGNOSIS — Z9841 Cataract extraction status, right eye: Secondary | ICD-10-CM

## 2019-07-04 DIAGNOSIS — I5022 Chronic systolic (congestive) heart failure: Secondary | ICD-10-CM

## 2019-07-04 DIAGNOSIS — M810 Age-related osteoporosis without current pathological fracture: Secondary | ICD-10-CM | POA: Diagnosis present

## 2019-07-04 DIAGNOSIS — Z66 Do not resuscitate: Secondary | ICD-10-CM | POA: Diagnosis present

## 2019-07-04 LAB — CBC
HCT: 36.4 % (ref 36.0–46.0)
Hemoglobin: 11.8 g/dL — ABNORMAL LOW (ref 12.0–15.0)
MCH: 35.3 pg — ABNORMAL HIGH (ref 26.0–34.0)
MCHC: 32.4 g/dL (ref 30.0–36.0)
MCV: 109 fL — ABNORMAL HIGH (ref 80.0–100.0)
Platelets: 214 10*3/uL (ref 150–400)
RBC: 3.34 MIL/uL — ABNORMAL LOW (ref 3.87–5.11)
RDW: 17 % — ABNORMAL HIGH (ref 11.5–15.5)
WBC: 8.7 10*3/uL (ref 4.0–10.5)
nRBC: 0.3 % — ABNORMAL HIGH (ref 0.0–0.2)

## 2019-07-04 LAB — URINALYSIS, COMPLETE (UACMP) WITH MICROSCOPIC
Bilirubin Urine: NEGATIVE
Glucose, UA: NEGATIVE mg/dL
Hgb urine dipstick: NEGATIVE
Ketones, ur: NEGATIVE mg/dL
Nitrite: NEGATIVE
Protein, ur: NEGATIVE mg/dL
Specific Gravity, Urine: 1.016 (ref 1.005–1.030)
pH: 5 (ref 5.0–8.0)

## 2019-07-04 LAB — COMPREHENSIVE METABOLIC PANEL
ALT: 15 U/L (ref 0–44)
AST: 50 U/L — ABNORMAL HIGH (ref 15–41)
Albumin: 3.2 g/dL — ABNORMAL LOW (ref 3.5–5.0)
Alkaline Phosphatase: 87 U/L (ref 38–126)
Anion gap: 8 (ref 5–15)
BUN: 35 mg/dL — ABNORMAL HIGH (ref 8–23)
CO2: 23 mmol/L (ref 22–32)
Calcium: 9.8 mg/dL (ref 8.9–10.3)
Chloride: 98 mmol/L (ref 98–111)
Creatinine, Ser: 2.61 mg/dL — ABNORMAL HIGH (ref 0.44–1.00)
GFR calc Af Amer: 19 mL/min — ABNORMAL LOW (ref >60)
GFR calc non Af Amer: 16 mL/min — ABNORMAL LOW (ref >60)
Glucose, Bld: 104 mg/dL — ABNORMAL HIGH (ref 70–99)
Potassium: 6.3 mmol/L (ref 3.5–5.1)
Sodium: 129 mmol/L — ABNORMAL LOW (ref 135–145)
Total Bilirubin: 3.1 mg/dL — ABNORMAL HIGH (ref 0.3–1.2)
Total Protein: 6.4 g/dL — ABNORMAL LOW (ref 6.5–8.1)

## 2019-07-04 LAB — SODIUM, URINE, RANDOM: Sodium, Ur: <10 mmol/L

## 2019-07-04 LAB — CREATININE, URINE, RANDOM: Creatinine, Urine: 198 mg/dL

## 2019-07-04 LAB — DIGOXIN LEVEL: Digoxin Level: 1.3 ng/mL (ref 0.8–2.0)

## 2019-07-04 LAB — GLUCOSE, CAPILLARY: Glucose-Capillary: 81 mg/dL (ref 70–99)

## 2019-07-04 LAB — OSMOLALITY: Osmolality: 288 mOsm/kg (ref 275–295)

## 2019-07-04 LAB — BRAIN NATRIURETIC PEPTIDE: B Natriuretic Peptide: 792 pg/mL — ABNORMAL HIGH (ref 0.0–100.0)

## 2019-07-04 LAB — OSMOLALITY, URINE: Osmolality, Ur: 421 mOsm/kg (ref 300–900)

## 2019-07-04 LAB — MAGNESIUM: Magnesium: 2.2 mg/dL (ref 1.7–2.4)

## 2019-07-04 LAB — POTASSIUM: Potassium: 5.2 mmol/L — ABNORMAL HIGH (ref 3.5–5.1)

## 2019-07-04 LAB — LIPASE, BLOOD: Lipase: 75 U/L — ABNORMAL HIGH (ref 11–51)

## 2019-07-04 MED ORDER — HEPARIN SODIUM (PORCINE) 5000 UNIT/ML IJ SOLN
5000.0000 [IU] | Freq: Three times a day (TID) | INTRAMUSCULAR | Status: DC
  Administered 2019-07-04 – 2019-07-13 (×26): 5000 [IU] via SUBCUTANEOUS
  Filled 2019-07-04 (×26): qty 1

## 2019-07-04 MED ORDER — SODIUM BICARBONATE 8.4 % IV SOLN
25.0000 meq | Freq: Once | INTRAVENOUS | Status: AC
  Administered 2019-07-04: 25 meq via INTRAVENOUS
  Filled 2019-07-04: qty 50

## 2019-07-04 MED ORDER — ACETAMINOPHEN 325 MG PO TABS
650.0000 mg | ORAL_TABLET | Freq: Four times a day (QID) | ORAL | Status: DC | PRN
  Administered 2019-07-06 – 2019-07-09 (×3): 650 mg via ORAL
  Filled 2019-07-04 (×3): qty 2

## 2019-07-04 MED ORDER — CALCIUM GLUCONATE-NACL 1-0.675 GM/50ML-% IV SOLN
1.0000 g | Freq: Once | INTRAVENOUS | Status: AC
  Administered 2019-07-04: 1000 mg via INTRAVENOUS
  Filled 2019-07-04: qty 50

## 2019-07-04 MED ORDER — SODIUM CHLORIDE 0.9 % IV SOLN: 1.0000 g | Freq: Once | INTRAVENOUS | Status: DC

## 2019-07-04 MED ORDER — INSULIN ASPART 100 UNIT/ML ~~LOC~~ SOLN
5.0000 [IU] | Freq: Once | SUBCUTANEOUS | Status: AC
  Administered 2019-07-04: 5 [IU] via INTRAVENOUS
  Filled 2019-07-04: qty 1

## 2019-07-04 MED ORDER — ONDANSETRON HCL 4 MG PO TABS: 4.0000 mg | ORAL_TABLET | Freq: Four times a day (QID) | ORAL | Status: DC | PRN

## 2019-07-04 MED ORDER — METOPROLOL SUCCINATE ER 50 MG PO TB24: 100.0000 mg | ORAL_TABLET | Freq: Every day | ORAL | Status: DC

## 2019-07-04 MED ORDER — LEVOTHYROXINE SODIUM 50 MCG PO TABS
75.0000 ug | ORAL_TABLET | Freq: Every day | ORAL | Status: DC
  Administered 2019-07-05 – 2019-07-13 (×9): 75 ug via ORAL
  Filled 2019-07-04 (×9): qty 2

## 2019-07-04 MED ORDER — SODIUM POLYSTYRENE SULFONATE 15 GM/60ML PO SUSP
15.0000 g | Freq: Once | ORAL | Status: AC
  Administered 2019-07-04: 15 g via ORAL
  Filled 2019-07-04: qty 60

## 2019-07-04 MED ORDER — SODIUM CHLORIDE 0.9 % IV SOLN: INTRAVENOUS | Status: AC

## 2019-07-04 MED ORDER — SODIUM CHLORIDE 0.9 % IV BOLUS
250.0000 mL | Freq: Once | INTRAVENOUS | Status: AC
  Administered 2019-07-04: 250 mL via INTRAVENOUS

## 2019-07-04 MED ORDER — DEXTROSE 50 % IV SOLN
25.0000 mL | Freq: Once | INTRAVENOUS | Status: AC
  Administered 2019-07-04: 25 mL via INTRAVENOUS
  Filled 2019-07-04: qty 50

## 2019-07-04 MED ORDER — ONDANSETRON HCL 4 MG/2ML IJ SOLN
4.0000 mg | Freq: Four times a day (QID) | INTRAMUSCULAR | Status: DC | PRN
  Administered 2019-07-08 – 2019-07-13 (×5): 4 mg via INTRAVENOUS
  Filled 2019-07-04 (×6): qty 2

## 2019-07-04 MED ORDER — ACETAMINOPHEN 650 MG RE SUPP
650.0000 mg | Freq: Four times a day (QID) | RECTAL | Status: DC | PRN
  Filled 2019-07-04: qty 1

## 2019-07-04 NOTE — ED Triage Notes (Addendum)
Pt presents via [facility] from home with c/o fatigue. Pt states symptoms have been getting progressively worse over the past few days. Pt baseline walks with cane at home. Pt has known hx of afib. Pt currently alert and oriented x4

## 2019-07-04 NOTE — H&P (Addendum)
History and Physical    Desiree Koch GMW:[id number] DOB: 1930/08/29 DOA: 07/04/2019  PCP: Adin Hector, MD  Patient coming from: Home via EMS  I have personally briefly reviewed patient's old medical records in [place]  Chief Complaint: Weakness  HPI: Desiree Koch is a 86 y.o. female with medical history significant for paroxysmal A. fib/flutter not on anticoagulation due to history of bleeding, CAD, CKD stage III, chronic systolic CHF (EF 44-03% by TTE 06/16/2019), LBBB, hepatic cirrhosis, hypertension, hypothyroidism who presents to the ED for evaluation of generalized weakness.  Patient was recently hospitalized from 06/15/2019-06/20/2019 for acute on chronic CHF exacerbation and decompensated cirrhosis with ascites.  She was diuresed with IV Lasix and underwent paracentesis on 06/16/2019 with removal of 3 L peritoneal fluid.  Ascites fluid did not show sign of infection or malignant cells.  She was discharged on diuretic therapy of Lasix 40 mg daily and Aldactone 12.5 mg daily.  She was also started on digoxin for her paroxysmal atrial fibrillation.  She was seen in follow-up by her PCP on 06/26/2019.  She was noted to be rapidly accumulating fluid.  Her Lasix was increased to 80 mg daily.  She underwent repeat ultrasound paracentesis on 06/27/2019 with removal of 4.0 L of peritoneal fluid.  Patient states that she has been feeling overall weak from the last couple days particularly with muscle weakness as well as cramping sensation in both of her calves.  She normally walks with the use of a cane but has been having difficulty doing this.  She has not fallen and denies any loss of consciousness.  She states she has had some increased swelling in her abdomen but not as much as prior to her most recent paracentesis.  She does report some shortness of breath the last couple days.  She denies any chest pain, palpitations, abdominal pain, dysuria, nausea, vomiting, or  diarrhea.   ED Course:  Initial vitals showed BP 123/97, pulse 114, RR 16, temp 98.2 Fahrenheit, SPO2 9 9% room air.  Labs are notable for potassium 6.3, sodium 129, bicarb 23, BUN 35, creatinine 2.61 (creatinine 1.59 on 06/20/2019), AST 50, ALT 15, alk phos 87, total bilirubin 3.1, WBC 8.7, hemoglobin 11.8, platelets 214,000, BNP 792, lipase 75, magnesium 2.2.  Urinalysis showed negative nitrites, large leukocytes, 6-10 RBC/hpf, 21-50 WBC/hpf, rare bacteria microscopy.  SARS-CoV-2 PCR test is obtained and pending.  2 view chest x-ray shows cardiomegaly, stable blunting of the right costophrenic angle, and no evidence of acute infiltrate.  Patient was given IV calcium gluconate 1 g, Kayexalate, 250 cc normal saline.  EDP discussed case with on-call nephrology who recommended also given insulin with D50 and 1 amp of bicarbonate.  The hospitalist service was consulted to admit for further evaluation and management.  Review of Systems: All systems reviewed and are negative except as documented in history of present illness above.   Past Medical History:  Diagnosis Date  . Arthritis   . Cancer ([place]) 2018   skin ca  . Cardiac arrhythmia   . Coronary artery disease   . CRD (chronic renal disease)   . Dysrhythmia   . Heart murmur   . HOH (hard of hearing)   . Hypercholesteremia   . Hypertension   . Hypothyroidism   . Leaky heart valve   . Liver damage   . Lower extremity edema   . Multiple allergies   . Myocardial infarction ([place])   . Osteoporosis   .  Pneumonia     Past Surgical History:  Procedure Laterality Date  . ABDOMINAL HYSTERECTOMY    . CATARACT EXTRACTION W/PHACO Left 09/12/2015   Procedure: CATARACT EXTRACTION PHACO AND INTRAOCULAR LENS PLACEMENT (IOC);  Surgeon: Eulogio Bear, MD;  Location: [facility] ORS;  Service: Ophthalmology;  Laterality: Left;  Lot# [id number] H [place]: 01:02.0 AP%:15.3 CDE: 9.45  . CATARACT EXTRACTION W/PHACO Right 10/31/2015   Procedure: CATARACT  EXTRACTION PHACO AND INTRAOCULAR LENS PLACEMENT (IOC);  Surgeon: Eulogio Bear, MD;  Location: [facility] ORS;  Service: Ophthalmology;  Laterality: Right;  [place] 01:16.2 AP% 14.8 CDE 11.27 Fluid Pack Lot # [id number]  . CESAREAN SECTION    . CHOLECYSTECTOMY    . KNEE ARTHROSCOPY    . KYPHOPLASTY N/A 03/17/2019   Procedure: KYPHOPLASTY L2;  Surgeon: Hessie Knows, MD;  Location: [facility] ORS;  Service: Orthopedics;  Laterality: N/A;  . THYROIDECTOMY      Social History:  reports that she has never smoked. She has never used smokeless tobacco. She reports that she does not drink alcohol or use drugs.  Allergies  Allergen Reactions  . Amlodipine Swelling  . Clonidine Derivatives Swelling  . Norvasc [Amlodipine Besylate] Swelling  . Aspirin Other (See Comments)    Blood pressure goes up  . Celebrex [Celecoxib] Other (See Comments)    Blood pressure goes up  . Codeine Swelling  . Propoxyphene Swelling  . Latex Other (See Comments)    NEGATIVE RASH TEST 09/06/15  . Penicillins Other (See Comments)    Patient does not want medication Did it involve swelling of the face/tongue/throat, SOB, or low BP? Unknown Did it involve sudden or severe rash/hives, skin peeling, or any reaction on the inside of your mouth or nose? Unknown Did you need to seek medical attention at a hospital or doctor's office? Unknown When did it last happen?childhood If all above answers are "NO", may proceed with cephalosporin use.  . Ace Inhibitors Cough  . Ciprofloxacin Palpitations  . Flexeril [Cyclobenzaprine] Other (See Comments)    Blood pressure goes up  . Hctz [Hydrochlorothiazide] Other (See Comments)    Unknown  . Lisinopril Other (See Comments)    Susceptible to sun  . Meperidine And Related Other (See Comments)    Unknown  . Pravastatin Rash  . Sulfa Antibiotics Rash    Family History  Problem Relation Age of Onset  . Heart disease Father   . Stroke Sister   . Cancer Brother   . Breast cancer  Neg Hx      Prior to Admission medications   Medication Sig Start Date End Date Taking? Authorizing Provider  acetaminophen (TYLENOL) 325 MG tablet Take 325-650 mg by mouth every 6 (six) hours as needed for mild pain or moderate pain.    Yes [provider]  furosemide (LASIX) 40 MG tablet Take 1 tablet (40 mg total) by mouth daily. Patient taking differently: Take 80 mg by mouth daily.  06/21/19 09/19/19 Yes Enzo Bi, MD  cholecalciferol (VITAMIN D3) 25 MCG (1000 UT) tablet Take 2,000 Units by mouth daily.     [provider]  digoxin (LANOXIN) 0.125 MG tablet  06/20/19   [provider]  digoxin 62.5 MCG TABS Take 0.0625 mg by mouth daily. 06/21/19 09/19/19  Enzo Bi, MD  fexofenadine (ALLEGRA) 180 MG tablet Take 180 mg by mouth at bedtime.     [provider]  fluticasone (FLONASE) 50 MCG/ACT nasal spray Place 2 sprays into both nostrils daily as needed for rhinitis.  [provider]  Glucosamine 500 MG CAPS Take 500 mg by mouth daily.     [provider]  levothyroxine (SYNTHROID, LEVOTHROID) 75 MCG tablet Take 75 mcg by mouth daily.    [provider]  metoprolol succinate (TOPROL-XL) 100 MG 24 hr tablet Take 1 tablet (100 mg total) by mouth daily. 06/20/19 09/18/19  Enzo Bi, MD  metoprolol tartrate (LOPRESSOR) 50 MG tablet Take 50 mg by mouth 2 (two) times daily. 06/26/19   [provider]  Multiple Vitamins-Minerals (PRESERVISION AREDS 2+MULTI VIT PO) Take 2 tablets by mouth daily.    [provider]  potassium chloride SA (KLOR-CON) 20 MEQ tablet Take 20 mEq by mouth daily. 06/14/19   [provider]  spironolactone (ALDACTONE) 25 MG tablet Take 0.5 tablets (12.5 mg total) by mouth daily. 06/21/19 09/19/19  Enzo Bi, MD    Physical Exam: Vitals:   07/04/19 1620  BP: (!) 123/97  Pulse: (!) 114  Resp: 16  Temp: 98.2 F (36.8 C)  TempSrc: Oral  SpO2: 99%  Weight: 67.4 kg  Height: 5' 1" (1.549  m)   Constitutional: Elderly woman resting supine in bed, NAD, calm, comfortable Eyes: PERRL, lids and conjunctivae normal ENMT: Mucous membranes are moist. Posterior pharynx clear of any exudate or lesions.Normal dentition.  Somewhat hard of hearing. Neck: normal, supple, no masses. Respiratory: clear to auscultation bilaterally, no wheezing, no crackles. Normal respiratory effort. No accessory muscle use.  Cardiovascular: Irregularly irregular, no murmurs / rubs / gallops.  +1 bilateral lower extremity edema. 2+ pedal pulses. Abdomen: Slightly distended but not tense abdomen, no tenderness, no masses palpated.Bowel sounds positive.  Musculoskeletal: no clubbing / cyanosis. No joint deformity upper and lower extremities. Good ROM, no contractures. Normal muscle tone.  Skin: no rashes, lesions, ulcers. No induration Neurologic: CN 2-12 grossly intact. Sensation intact, Strength 5/5 in all 4.  Psychiatric: Normal judgment and insight. Alert and oriented x 3. Normal mood.   Labs on Admission: I have personally reviewed following labs and imaging studies  CBC: Recent Labs  Lab 07/04/19 1612  WBC 8.7  HGB 11.8*  HCT 36.4  MCV 109.0*  PLT 829   Basic Metabolic Panel: Recent Labs  Lab 07/04/19 1736  NA 129*  K 6.3*  CL 98  CO2 23  GLUCOSE 104*  BUN 35*  CREATININE 2.61*  CALCIUM 9.8  MG 2.2   GFR: Estimated Creatinine Clearance: 13.6 mL/min (A) (by C-G formula based on SCr of 2.61 mg/dL (H)). Liver Function Tests: Recent Labs  Lab 07/04/19 1736  AST 50*  ALT 15  ALKPHOS 87  BILITOT 3.1*  PROT 6.4*  ALBUMIN 3.2*   Recent Labs  Lab 07/04/19 1736  LIPASE 75*   No results for input(s): AMMONIA in the last 168 hours. Coagulation Profile: No results for input(s): INR, PROTIME in the last 168 hours. Cardiac Enzymes: No results for input(s): CKTOTAL, CKMB, CKMBINDEX, TROPONINI in the last 168 hours. BNP (last 3 results) No results for input(s): PROBNP in the last  8760 hours. HbA1C: No results for input(s): HGBA1C in the last 72 hours. CBG: Recent Labs  Lab 07/04/19 1842  GLUCAP 81   Lipid Profile: No results for input(s): CHOL, HDL, LDLCALC, TRIG, CHOLHDL, LDLDIRECT in the last 72 hours. Thyroid Function Tests: No results for input(s): TSH, T4TOTAL, FREET4, T3FREE, THYROIDAB in the last 72 hours. Anemia Panel: No results for input(s): VITAMINB12, FOLATE, FERRITIN, TIBC, IRON, RETICCTPCT in the last 72 hours. Urine analysis:  Component Value Date/Time   COLORURINE AMBER (A) 07/04/2019 1832   APPEARANCEUR CLOUDY (A) 07/04/2019 1832   LABSPEC 1.016 07/04/2019 1832   PHURINE 5.0 07/04/2019 1832   GLUCOSEU NEGATIVE 07/04/2019 1832   HGBUR NEGATIVE 07/04/2019 1832   BILIRUBINUR NEGATIVE 07/04/2019 [place] 07/04/2019 1832   PROTEINUR NEGATIVE 07/04/2019 1832   NITRITE NEGATIVE 07/04/2019 1832   LEUKOCYTESUR LARGE (A) 07/04/2019 1832    Radiological Exams on Admission: DG Chest 2 View  Result Date: 07/04/2019 CLINICAL DATA:  Fatigue for 1 week EXAM: CHEST - 2 VIEW COMPARISON:  06/15/2019 FINDINGS: Cardiomegaly is again identified. The lungs are well aerated bilaterally. Blunting of the right costophrenic angle is noted stable in appearance from the prior exam. No acute infiltrate is seen. No bony abnormality is noted. Degenerative changes in the thoracic spine are seen. IMPRESSION: No active cardiopulmonary disease. Electronically Signed   By: Inez Catalina M.D.   On: 07/04/2019 16:40    EKG: Independently reviewed.  Afib with frequent PVCs, LBBB, low voltage.  Rate is slower and PVCs are new when compared to prior.  Assessment/Plan Principal Problem:   Hyperkalemia Active Problems:   Acute kidney injury superimposed on chronic kidney disease (HCC)   AF (paroxysmal atrial fibrillation) (HCC)   Hypothyroidism   CAD (coronary artery disease)   Chronic systolic CHF (congestive heart failure) (HCC)   Other cirrhosis of  liver (HCC)   Essential hypertension  Desiree Koch is a 86 y.o. female with medical history significant for paroxysmal A. fib/flutter not on anticoagulation due to history of bleeding, CAD, CKD stage III, chronic systolic CHF (EF 15-05% by TTE 06/16/2019), LBBB, hepatic cirrhosis, hypertension, hypothyroidism who is admitted with hyperkalemia and AKI on CKD stage III.  Hyperkalemia: In setting of spironolactone and potassium supplementation use on top of acute kidney injury.  EKG has new frequent PVCs when compared to prior. -S/p IV calcium gluconate, Kayexalate, NovoLog 5 units/D50, 1 amp bicarb -Hold spironolactone and potassium supplement -Recheck potassium level tonight and treat further if remains elevated -Continue telemetry  AKI on CKD stage III: Creatinine 2.61 compared to 1.59 on 06/20/2019.  Suspect medication effect in setting of spironolactone and increased Lasix dosing however component of cardiorenal or hepatorenal syndrome also possible.  On exam she might have some mild ascites but overall appears intravascularly volume depleted. -Hold Lasix and spironolactone -Status post 250 cc normal saline in the ED, will give gentle IV fluid hydration with NS @ 75 mL/hr x 10 hrs -Obtain urine studies -Monitor strict I/O's -Repeat labs in a.m.  Paroxysmal atrial fibrillation/flutter: Remains in atrial fibrillation with rate controlled and frequent PVCs.  Not on anticoagulation due to prior history of bleeding.  Recently started on digoxin. -Holding metoprolol due to soft blood pressure -Hold digoxin for now, check digoxin level  Chronic systolic CHF: EF 69-79% by TTE 06/16/2019.  As above appears overall intravascularly volume depleted.  Giving gentle IV fluids tonight, monitor strict I/O's, repeat labs in a.m. and resume Lasix as soon as possible.  Cirrhosis: Felt secondary to CHF and/or amiodarone use in the past.  Had recent paracentesis on 06/27/2019 with removal of 4 L peritoneal  fluid.  Does not appear to have large volume ascites on exam at time of admission.  Holding Lasix and spironolactone as above.  Need to monitor volume status closely.  Hypertension: Holding metoprolol due to soft blood pressure.  Holding Lasix for now as above.  CAD: Chronic and stable, denies any chest  pain.   Hypothyroidism: Continue Synthroid.  DVT prophylaxis: Subcutaneous heparin Code Status: Full code, confirmed with patient Family Communication: Discussed with patient, she has discussed with family Disposition Plan: From home, likely discharge home pending improvement in hyperkalemia and renal function. Consults called: None Admission status: Observation   Zada Finders MD [facility]  If 7PM-7AM, please contact night-coverage [email or web address]  07/04/2019, 8:06 PM

## 2019-07-04 NOTE — ED Provider Notes (Signed)
[facility] Emergency Department Provider Note   ____________________________________________   First MD Initiated Contact with Patient 07/04/19 1608     (approximate)  I have reviewed the triage vital signs and the nursing notes.   HISTORY  Chief Complaint Fatigue for 2 weeks    HPI ENAS SUERO is a 86 y.o. female here for evaluation of feeling weak.  Over the last 2 weeks she has had episodes where she will feel very fatigued low on energy.  She reports slightly diminished diet.  She is on fluid pills and takes potassium.  She does have a history of A. fib but does not take any blood thinners any longer  She reports has had fluid drawn off her abdomen couple times over the last 2 weeks, and that the swelling is not new for her.  No abdominal pain no fevers or chills.  No known Covid exposure  No chest pain no shortness of breath.  Keeps a little bit of fluid and swelling around her ankles but reports this is not new and has been chronic  She reports that she will just have episodes where she will does feel very fatigued sometimes lasting half a day.  No pain or burning with urination   Past Medical History:  Diagnosis Date  . Arthritis   . Cancer ([place]) 2018   skin ca  . Cardiac arrhythmia   . Coronary artery disease   . CRD (chronic renal disease)   . Dysrhythmia   . Heart murmur   . HOH (hard of hearing)   . Hypercholesteremia   . Hypertension   . Hypothyroidism   . Leaky heart valve   . Liver damage   . Lower extremity edema   . Multiple allergies   . Myocardial infarction ([place])   . Osteoporosis   . Pneumonia     Patient Active Problem List   Diagnosis Date Noted  . Hyperkalemia 07/04/2019  . Essential hypertension 07/04/2019  . Other cirrhosis of liver ([place]) 06/18/2019  . Ascites 06/18/2019  . Anemia 06/18/2019  . Acute on chronic systolic CHF (congestive heart failure) ([place]) 06/15/2019  . Acute kidney injury superimposed  on chronic kidney disease ([place]) 06/15/2019  . AF (paroxysmal atrial fibrillation) ([place]) 06/15/2019  . Hypothyroidism 06/15/2019  . CAD (coronary artery disease) 06/15/2019  . Chronic systolic CHF (congestive heart failure) ([place]) 06/15/2019  . Bradycardia 06/16/2017    Past Surgical History:  Procedure Laterality Date  . ABDOMINAL HYSTERECTOMY    . CATARACT EXTRACTION W/PHACO Left 09/12/2015   Procedure: CATARACT EXTRACTION PHACO AND INTRAOCULAR LENS PLACEMENT (IOC);  Surgeon: Eulogio Bear, MD;  Location: [facility] ORS;  Service: Ophthalmology;  Laterality: Left;  Lot# [id number] H [place]: 01:02.0 AP%:15.3 CDE: 9.45  . CATARACT EXTRACTION W/PHACO Right 10/31/2015   Procedure: CATARACT EXTRACTION PHACO AND INTRAOCULAR LENS PLACEMENT (IOC);  Surgeon: Eulogio Bear, MD;  Location: [facility] ORS;  Service: Ophthalmology;  Laterality: Right;  [place] 01:16.2 AP% 14.8 CDE 11.27 Fluid Pack Lot # [id number]  . CESAREAN SECTION    . CHOLECYSTECTOMY    . KNEE ARTHROSCOPY    . KYPHOPLASTY N/A 03/17/2019   Procedure: KYPHOPLASTY L2;  Surgeon: Hessie Knows, MD;  Location: [facility] ORS;  Service: Orthopedics;  Laterality: N/A;  . THYROIDECTOMY      Prior to Admission medications   Medication Sig Start Date End Date Taking? Authorizing Provider  acetaminophen (TYLENOL) 325 MG tablet Take 325-650 mg by mouth every 6 (six) hours as needed  for mild pain or moderate pain.    Yes [provider]  cholecalciferol (VITAMIN D3) 25 MCG (1000 UT) tablet Take 2,000 Units by mouth daily.     [provider]  digoxin (LANOXIN) 0.125 MG tablet  06/20/19   [provider]  digoxin 62.5 MCG TABS Take 0.0625 mg by mouth daily. 06/21/19 09/19/19  Enzo Bi, MD  fexofenadine (ALLEGRA) 180 MG tablet Take 180 mg by mouth at bedtime.     [provider]  fluticasone (FLONASE) 50 MCG/ACT nasal spray Place 2 sprays into both nostrils daily as needed for rhinitis.     [provider]  furosemide  (LASIX) 40 MG tablet Take 1 tablet (40 mg total) by mouth daily. Patient taking differently: Take 80 mg by mouth daily.  06/21/19 09/19/19  Enzo Bi, MD  Glucosamine 500 MG CAPS Take 500 mg by mouth daily.     [provider]  levothyroxine (SYNTHROID, LEVOTHROID) 75 MCG tablet Take 75 mcg by mouth daily.    [provider]  metoprolol succinate (TOPROL-XL) 100 MG 24 hr tablet Take 1 tablet (100 mg total) by mouth daily. 06/20/19 09/18/19  Enzo Bi, MD  metoprolol tartrate (LOPRESSOR) 50 MG tablet Take 50 mg by mouth 2 (two) times daily. 06/26/19   [provider]  Multiple Vitamins-Minerals (PRESERVISION AREDS 2+MULTI VIT PO) Take 2 tablets by mouth daily.    [provider]  potassium chloride SA (KLOR-CON) 20 MEQ tablet Take 20 mEq by mouth daily. 06/14/19   [provider]  spironolactone (ALDACTONE) 25 MG tablet Take 0.5 tablets (12.5 mg total) by mouth daily. 06/21/19 09/19/19  Enzo Bi, MD    Allergies Amlodipine, Clonidine derivatives, Norvasc [amlodipine besylate], Aspirin, Celebrex [celecoxib], Codeine, Propoxyphene, Latex, Penicillins, Ace inhibitors, Ciprofloxacin, Flexeril [cyclobenzaprine], Hctz [hydrochlorothiazide], Lisinopril, Meperidine and related, Pravastatin, and Sulfa antibiotics  Family History  Problem Relation Age of Onset  . Heart disease Father   . Stroke Sister   . Cancer Brother   . Breast cancer Neg Hx     Social History Social History   Tobacco Use  . Smoking status: Never Smoker  . Smokeless tobacco: Never Used  Substance Use Topics  . Alcohol use: No  . Drug use: No    Review of Systems Constitutional: No fever/chills but feels fatigued just generally low on energy Eyes: No visual changes. ENT: No sore throat. Cardiovascular: Denies chest pain. Respiratory: Denies shortness of breath. Gastrointestinal: No abdominal pain.  Moving bowels normally.  No vomiting.  No nausea.  Hungry right now.  Abdomen is  slightly swollen but she reports this is normal for her and she is had fluid taken off of it in the past.  There is no pain. Genitourinary: Negative for dysuria. Musculoskeletal: Negative for back pain. Skin: Negative for rash. Neurological: Negative for headaches, areas of focal weakness or numbness.    ____________________________________________   PHYSICAL EXAM:  VITAL SIGNS: ED Triage Vitals  Enc Vitals Group     BP      Pulse      Resp      Temp      Temp src      SpO2      Weight      Height      Head Circumference      Peak Flow      Pain Score      Pain Loc      Pain Edu?      Excl.  in [place]?     Constitutional: Alert and oriented. Well appearing and in no acute distress. Eyes: Conjunctivae are normal. Head: Atraumatic. Nose: No congestion/rhinnorhea. Mouth/Throat: Mucous membranes are moist. Neck: No stridor.  Cardiovascular: Normal rate, irregular rhythm. Grossly normal heart sounds.  Good peripheral circulation. Respiratory: Normal respiratory effort.  No retractions. Lungs CTAB. Gastrointestinal: Soft and nontender.  Moderately distended with fluid wave.  Suspect ascites.  No pain in any quadrant.  No pain to percussion.  No peritonitis denoted. Musculoskeletal: No lower extremity tenderness trace bilateral lower extremity edema. Neurologic:  Normal speech and language. No gross focal neurologic deficits are appreciated.  Skin:  Skin is warm, dry and intact. No rash noted. Psychiatric: Mood and affect are normal. Speech and behavior are normal.  ____________________________________________   LABS (all labs ordered are listed, but only abnormal results are displayed)  Labs Reviewed  CBC - Abnormal; Notable for the following components:      Result Value   RBC 3.34 (*)    Hemoglobin 11.8 (*)    MCV 109.0 (*)    MCH 35.3 (*)    RDW 17.0 (*)    nRBC 0.3 (*)    All other components within normal limits  URINALYSIS, COMPLETE (UACMP) WITH MICROSCOPIC -  Abnormal; Notable for the following components:   Color, Urine AMBER (*)    APPearance CLOUDY (*)    Leukocytes,Ua LARGE (*)    Bacteria, UA RARE (*)    Non Squamous Epithelial PRESENT (*)    All other components within normal limits  BRAIN NATRIURETIC PEPTIDE - Abnormal; Notable for the following components:   B Natriuretic Peptide 792.0 (*)    All other components within normal limits  COMPREHENSIVE METABOLIC PANEL - Abnormal; Notable for the following components:   Sodium 129 (*)    Potassium 6.3 (*)    Glucose, Bld 104 (*)    BUN 35 (*)    Creatinine, Ser 2.61 (*)    Total Protein 6.4 (*)    Albumin 3.2 (*)    AST 50 (*)    Total Bilirubin 3.1 (*)    GFR calc non Af Amer 16 (*)    GFR calc Af Amer 19 (*)    All other components within normal limits  LIPASE, BLOOD - Abnormal; Notable for the following components:   Lipase 75 (*)    All other components within normal limits  SARS CORONAVIRUS 2 (TAT 6-24 HRS)  MAGNESIUM  GLUCOSE, CAPILLARY   ____________________________________________  EKG  Reviewed inter by at 1615 Heart rate 90 QRS 140 QTc 480 Probable underlying slow atrial rhythm, possibly A. fib.  Multiple various ventricular complexes are observed including PVCs versus left bundle branch block and frequent right bundle branch block appearance morphologies No STEMI.  Nonspecific T wave abnormalities.  ____________________________________________  [id number]  DG Chest 2 View  Result Date: 07/04/2019 CLINICAL DATA:  Fatigue for 1 week EXAM: CHEST - 2 VIEW COMPARISON:  06/15/2019 FINDINGS: Cardiomegaly is again identified. The lungs are well aerated bilaterally. Blunting of the right costophrenic angle is noted stable in appearance from the prior exam. No acute infiltrate is seen. No bony abnormality is noted. Degenerative changes in the thoracic spine are seen. IMPRESSION: No active cardiopulmonary disease. Electronically Signed   By: Inez Catalina M.D.   On:  07/04/2019 16:40    Chest x-ray clear.  No signs of evidence of CHF ____________________________________________   PROCEDURES  Procedure(s) performed: 3-lead  .1-3 Lead EKG Interpretation Performed by:  Delman Kitten, MD Authorized by: Delman Kitten, MD     Interpretation: abnormal     ECG rate:  77   ECG rate assessment: tachycardic     Rhythm: atrial fibrillation     Ectopy: PVCs     Conduction: abnormal      Critical Care performed: Yes, see critical care note(s)  CRITICAL CARE Performed by: Delman Kitten   Total critical care time: 25 minutes  Critical care time was exclusive of separately billable procedures and treating other patients.  Critical care was necessary to treat or prevent imminent or life-threatening deterioration.  Critical care was time spent personally by me on the following activities: development of treatment plan with patient and/or surrogate as well as nursing, discussions with consultants, evaluation of patient's response to treatment, examination of patient, obtaining history from patient or surrogate, ordering and performing treatments and interventions, ordering and review of laboratory studies, ordering and review of radiographic studies, pulse oximetry and re-evaluation of patient's condition.  ____________________________________________   INITIAL IMPRESSION / ASSESSMENT AND PLAN / ED COURSE  Pertinent labs & imaging results that were available during my care of the patient were reviewed by me and considered in my medical decision making (see chart for details).   Patient presents with symptoms of intermittent fatigue and malaise over the last 2 weeks.  Recently admitted and discharged with CHF exacerbation also had paracentesis due to sepsis cirrhosis suspected to be due to amiodarone use  She currently hemodynamically reassuring, nontoxic well-appearing fully alert without distress but does have an abnormal ventricular conduction.  Awaiting  consult from cardiology, paced and reviewed by Dr. Saralyn Pilar of [place] to check labs including electrolytes, chest x-ray, BNP, etc.    Clinical Course as of Jul 04 1902  Tue Jul 04, 2019  [facility] cardiology team advises EKG demonstrates atrial fibrillation with left bundle branch block and frequent PVCs   [MQ]  1816 Patient resting comfortably in no distress.  Updated her on her chest x-ray and blood count.  Awaiting remaining lab testing.  She is alert, in no distress.   [MQ]    Clinical Course User Index [MQ] Delman Kitten, MD    ----------------------------------------- 6:36 PM on 07/04/2019 -----------------------------------------  On labs concerning with critically elevated potassium.  Associated with worsening renal clearance.  Have ordered calcium gluconate, Kayexalate, small fluid bolus.  Discussed with Dr. Juleen China who also recommends treatment with insulin dextrose and bicarb.  Patient will be admitted to the hospitalist service for further care and management.  Monitoring closely for worsening arrhythmias or cardiac abnormalities.  Discussed with the patient, she is understandable agreeable with plan for admission.  Also placed consultation to Dr. Josefa Half of cardiology ____________________________________________  ----------------------------------------- 7:04 PM on 07/04/2019 -----------------------------------------  Urine sample appears dirty, patient denied urinary symptoms.  Will add urine culture.  Patient be admitted to the hospital, Dr. Posey Pronto hospitalist discussed case with me   FINAL CLINICAL IMPRESSION(S) / ED DIAGNOSES  Final diagnoses:  Hyperkalemia  Acute renal failure superimposed on chronic kidney disease, unspecified CKD stage, unspecified acute renal failure type [facility])  Ventricular ectopy        Note:  This document was prepared using Dragon voice recognition software and may include unintentional dictation errors       Delman Kitten, MD 07/04/19 1905

## 2019-07-05 DIAGNOSIS — I5041 Acute combined systolic (congestive) and diastolic (congestive) heart failure: Secondary | ICD-10-CM | POA: Diagnosis not present

## 2019-07-05 DIAGNOSIS — Z515 Encounter for palliative care: Secondary | ICD-10-CM | POA: Diagnosis not present

## 2019-07-05 DIAGNOSIS — R188 Other ascites: Secondary | ICD-10-CM | POA: Diagnosis present

## 2019-07-05 DIAGNOSIS — Z7189 Other specified counseling: Secondary | ICD-10-CM | POA: Diagnosis not present

## 2019-07-05 DIAGNOSIS — Z66 Do not resuscitate: Secondary | ICD-10-CM | POA: Diagnosis present

## 2019-07-05 DIAGNOSIS — I10 Essential (primary) hypertension: Secondary | ICD-10-CM | POA: Diagnosis not present

## 2019-07-05 DIAGNOSIS — E871 Hypo-osmolality and hyponatremia: Secondary | ICD-10-CM | POA: Diagnosis not present

## 2019-07-05 DIAGNOSIS — Z20822 Contact with and (suspected) exposure to covid-19: Secondary | ICD-10-CM | POA: Diagnosis present

## 2019-07-05 DIAGNOSIS — N189 Chronic kidney disease, unspecified: Secondary | ICD-10-CM | POA: Diagnosis not present

## 2019-07-05 DIAGNOSIS — E890 Postprocedural hypothyroidism: Secondary | ICD-10-CM | POA: Diagnosis present

## 2019-07-05 DIAGNOSIS — I272 Pulmonary hypertension, unspecified: Secondary | ICD-10-CM | POA: Diagnosis present

## 2019-07-05 DIAGNOSIS — E7800 Pure hypercholesterolemia, unspecified: Secondary | ICD-10-CM | POA: Diagnosis present

## 2019-07-05 DIAGNOSIS — Z961 Presence of intraocular lens: Secondary | ICD-10-CM | POA: Diagnosis present

## 2019-07-05 DIAGNOSIS — M810 Age-related osteoporosis without current pathological fracture: Secondary | ICD-10-CM | POA: Diagnosis present

## 2019-07-05 DIAGNOSIS — I447 Left bundle-branch block, unspecified: Secondary | ICD-10-CM | POA: Diagnosis present

## 2019-07-05 DIAGNOSIS — D539 Nutritional anemia, unspecified: Secondary | ICD-10-CM | POA: Diagnosis present

## 2019-07-05 DIAGNOSIS — I472 Ventricular tachycardia: Secondary | ICD-10-CM | POA: Diagnosis not present

## 2019-07-05 DIAGNOSIS — I130 Hypertensive heart and chronic kidney disease with heart failure and stage 1 through stage 4 chronic kidney disease, or unspecified chronic kidney disease: Secondary | ICD-10-CM | POA: Diagnosis present

## 2019-07-05 DIAGNOSIS — K767 Hepatorenal syndrome: Secondary | ICD-10-CM | POA: Diagnosis present

## 2019-07-05 DIAGNOSIS — R140 Abdominal distension (gaseous): Secondary | ICD-10-CM | POA: Diagnosis not present

## 2019-07-05 DIAGNOSIS — N1832 Chronic kidney disease, stage 3b: Secondary | ICD-10-CM | POA: Diagnosis present

## 2019-07-05 DIAGNOSIS — I4892 Unspecified atrial flutter: Secondary | ICD-10-CM | POA: Diagnosis present

## 2019-07-05 DIAGNOSIS — N170 Acute kidney failure with tubular necrosis: Secondary | ICD-10-CM | POA: Diagnosis present

## 2019-07-05 DIAGNOSIS — I2510 Atherosclerotic heart disease of native coronary artery without angina pectoris: Secondary | ICD-10-CM | POA: Diagnosis present

## 2019-07-05 DIAGNOSIS — N179 Acute kidney failure, unspecified: Secondary | ICD-10-CM | POA: Diagnosis not present

## 2019-07-05 DIAGNOSIS — H9190 Unspecified hearing loss, unspecified ear: Secondary | ICD-10-CM | POA: Diagnosis present

## 2019-07-05 DIAGNOSIS — K7469 Other cirrhosis of liver: Secondary | ICD-10-CM | POA: Diagnosis present

## 2019-07-05 DIAGNOSIS — I5043 Acute on chronic combined systolic (congestive) and diastolic (congestive) heart failure: Secondary | ICD-10-CM | POA: Diagnosis present

## 2019-07-05 DIAGNOSIS — E875 Hyperkalemia: Secondary | ICD-10-CM | POA: Diagnosis present

## 2019-07-05 DIAGNOSIS — I480 Paroxysmal atrial fibrillation: Secondary | ICD-10-CM | POA: Diagnosis present

## 2019-07-05 DIAGNOSIS — I5022 Chronic systolic (congestive) heart failure: Secondary | ICD-10-CM | POA: Diagnosis not present

## 2019-07-05 LAB — CBC
HCT: 30 % — ABNORMAL LOW (ref 36.0–46.0)
Hemoglobin: 10.3 g/dL — ABNORMAL LOW (ref 12.0–15.0)
MCH: 35.9 pg — ABNORMAL HIGH (ref 26.0–34.0)
MCHC: 34.3 g/dL (ref 30.0–36.0)
MCV: 104.5 fL — ABNORMAL HIGH (ref 80.0–100.0)
Platelets: 212 10*3/uL (ref 150–400)
RBC: 2.87 MIL/uL — ABNORMAL LOW (ref 3.87–5.11)
RDW: 17.1 % — ABNORMAL HIGH (ref 11.5–15.5)
WBC: 8.6 10*3/uL (ref 4.0–10.5)
nRBC: 0.2 % (ref 0.0–0.2)

## 2019-07-05 LAB — BASIC METABOLIC PANEL
Anion gap: 10 (ref 5–15)
BUN: 36 mg/dL — ABNORMAL HIGH (ref 8–23)
CO2: 22 mmol/L (ref 22–32)
Calcium: 9.4 mg/dL (ref 8.9–10.3)
Chloride: 99 mmol/L (ref 98–111)
Creatinine, Ser: 2.6 mg/dL — ABNORMAL HIGH (ref 0.44–1.00)
GFR calc Af Amer: 19 mL/min — ABNORMAL LOW (ref >60)
GFR calc non Af Amer: 16 mL/min — ABNORMAL LOW (ref >60)
Glucose, Bld: 89 mg/dL (ref 70–99)
Potassium: 5.2 mmol/L — ABNORMAL HIGH (ref 3.5–5.1)
Sodium: 131 mmol/L — ABNORMAL LOW (ref 135–145)

## 2019-07-05 LAB — MAGNESIUM: Magnesium: 2.1 mg/dL (ref 1.7–2.4)

## 2019-07-05 LAB — SARS CORONAVIRUS 2 (TAT 6-24 HRS): SARS Coronavirus 2: NEGATIVE

## 2019-07-05 LAB — POTASSIUM: Potassium: 5.5 mmol/L — ABNORMAL HIGH (ref 3.5–5.1)

## 2019-07-05 MED ORDER — SODIUM ZIRCONIUM CYCLOSILICATE 10 G PO PACK
10.0000 g | PACK | Freq: Once | ORAL | Status: AC
  Administered 2019-07-05: 10 g via ORAL
  Filled 2019-07-05: qty 1

## 2019-07-05 MED ORDER — PANTOPRAZOLE SODIUM 20 MG PO TBEC
20.0000 mg | DELAYED_RELEASE_TABLET | Freq: Every day | ORAL | Status: DC
  Administered 2019-07-06 – 2019-07-13 (×8): 20 mg via ORAL
  Filled 2019-07-05 (×9): qty 1

## 2019-07-05 NOTE — Progress Notes (Signed)
[facility]  ROUNDING NOTE   Subjective:   [name] is admitted to [facility] on 07/04/2019 for Hyperkalemia [E87.5] Ventricular ectopy [I49.3] Acute renal failure superimposed on chronic kidney disease, unspecified CKD stage, unspecified acute renal failure type ([place]) [N17.9, N18.9]  Found to have acute renal failure with hyperkalemia. Thought to be secondary to potassium chloride, spironolactone  Objective:  Vital signs in last 24 hours:  Temp:  [97.6 F (36.4 C)-98.4 F (36.9 C)] 98 F (36.7 C) (04/07 1458) Pulse Rate:  [40-99] 46 (04/07 1500) Resp:  [14-24] 16 (04/07 0612) BP: (93-138)/(40-119) 101/56 (04/07 1500) SpO2:  [98 %-100 %] 100 % (04/07 1458) Weight:  [67 kg-70.1 kg] 70.1 kg (04/07 0500)  Weight change:  Filed Weights   07/04/19 1620 07/04/19 2156 07/05/19 0500  Weight: 67.4 kg 67 kg 70.1 kg    Intake/Output: I/O last 3 completed shifts: In: 1004 [P.O.:240; I.V.:464; IV Piggyback:300] Out: 200 [Urine:200]   Intake/Output this shift:  No intake/output data recorded.  Physical Exam: General: NAD,   Head: Normocephalic, atraumatic. Moist oral mucosal membranes  Eyes: Anicteric, PERRL  Neck: Supple, trachea midline  Lungs:  Clear to auscultation  Heart: Regular rate and rhythm  Abdomen:  +ascites  Extremities: no peripheral edema.  Neurologic: Nonfocal, moving all four extremities  Skin: No lesions        Basic Metabolic Panel: Recent Labs  Lab 07/04/19 1736 07/04/19 2145 07/05/19 0500  NA 129*  --  131*  K 6.3* 5.2* 5.2*  CL 98  --  99  CO2 23  --  22  GLUCOSE 104*  --  89  BUN 35*  --  36*  CREATININE 2.61*  --  2.60*  CALCIUM 9.8  --  9.4  MG 2.2  --  2.1    Liver Function Tests: Recent Labs  Lab 07/04/19 1736  AST 50*  ALT 15  ALKPHOS 87  BILITOT 3.1*  PROT 6.4*  ALBUMIN 3.2*   Recent Labs  Lab 07/04/19 1736  LIPASE 75*   No results for input(s): AMMONIA in the last 168 hours.  CBC: Recent Labs  Lab  07/04/19 1612 07/05/19 0500  WBC 8.7 8.6  HGB 11.8* 10.3*  HCT 36.4 30.0*  MCV 109.0* 104.5*  PLT 214 212    Cardiac Enzymes: No results for input(s): CKTOTAL, CKMB, CKMBINDEX, TROPONINI in the last 168 hours.  BNP: Invalid input(s): POCBNP  CBG: Recent Labs  Lab 07/04/19 1842  GLUCAP 56    Microbiology: Results for orders placed or performed during the hospital encounter of 07/04/19  SARS CORONAVIRUS 2 (TAT 6-24 HRS) Nasopharyngeal Nasopharyngeal Swab     Status: None   Collection Time: 07/04/19  4:43 PM   Specimen: Nasopharyngeal Swab  Result Value Ref Range Status   SARS Coronavirus 2 NEGATIVE NEGATIVE Final    Comment: (NOTE) SARS-CoV-2 target nucleic acids are NOT DETECTED. The SARS-CoV-2 RNA is generally detectable in upper and lower respiratory specimens during the acute phase of infection. Negative results do not preclude SARS-CoV-2 infection, do not rule out co-infections with other pathogens, and should not be used as the sole basis for treatment or other patient management decisions. Negative results must be combined with clinical observations, patient history, and epidemiological information. The expected result is Negative. Fact Sheet for Patients: SugarRoll.be Fact Sheet for Healthcare Providers: https://www.woods-mathews.com/ This test is not yet approved or cleared by the [place] FDA and  has been authorized for detection and/or diagnosis of  SARS-CoV-2 by FDA under an Emergency Use Authorization (EUA). This EUA will remain  in effect (meaning this test can be used) for the duration of the COVID-19 declaration under Section 56 4(b)(1) of the Act, 21 U.S.C. section 360bbb-3(b)(1), unless the authorization is terminated or revoked sooner. Performed at [facility], [place] [address], [place], [place] [address]   Urine Culture     Status: None (Preliminary result)   Collection Time: 07/04/19  6:32 PM    Specimen: Urine, Clean Catch  Result Value Ref Range Status   Specimen Description   Final    URINE, CLEAN CATCH Performed at [facility], [address], [place], [place] [address]    Special Requests   Final    NONE Performed at [facility], [place] [address], [place], [place] [address]    Culture PENDING  Incomplete   Report Status PENDING  Incomplete    Coagulation Studies: No results for input(s): LABPROT, INR in the last 72 hours.  Urinalysis: Recent Labs    07/04/19 1832  COLORURINE AMBER*  LABSPEC 1.016  PHURINE 5.0  GLUCOSEU NEGATIVE  HGBUR NEGATIVE  BILIRUBINUR NEGATIVE  KETONESUR NEGATIVE  PROTEINUR NEGATIVE  NITRITE NEGATIVE  LEUKOCYTESUR LARGE*      Imaging: DG Chest 2 View  Result Date: 07/04/2019 CLINICAL DATA:  Fatigue for 1 week EXAM: CHEST - 2 VIEW COMPARISON:  06/15/2019 FINDINGS: Cardiomegaly is again identified. The lungs are well aerated bilaterally. Blunting of the right costophrenic angle is noted stable in appearance from the prior exam. No acute infiltrate is seen. No bony abnormality is noted. Degenerative changes in the thoracic spine are seen. IMPRESSION: No active cardiopulmonary disease. Electronically Signed   By: Inez Catalina M.D.   On: 07/04/2019 16:40     Medications:    . heparin  5,000 Units Subcutaneous Q8H  . levothyroxine  75 mcg Oral Daily   acetaminophen **OR** acetaminophen, ondansetron **OR** ondansetron (ZOFRAN) IV  Assessment/ Plan:  [name] is a 86 y.o. white female withsystolic congestive heart failure, atrial fibrillation, hypertension, hypothyroidism, hepatic cirrhosis, coronary artery diseaseadmitted on 07/04/2019 for Hyperkalemia [E87.5] Ventricular ectopy [I49.3] Acute renal failure superimposed on chronic kidney disease, unspecified CKD stage, unspecified acute renal failure type ([place]) [N17.9, N18.9]  1. Acute renal failure with hyperkalemia on chronic kidney disease stage IIIB:  baseline creatinine of 1.4, GFR of 36 on 04/13/2019.  Chronic Kidney Disease most likely due to hypertensive nephrosclerosis Acute renal failure secondary to over diuresis. Continue to hold diuretics  2. Hypotension: blood pressure is low. On midodrine at home. Will monitor before restarting.   3. Cirrhosis with ascites:  Paracentesis as per hospitalist.  If paracentesis is scheduled, please administer IV albumin     LOS: 0 [place] Shaler 4/7/20215:07 PM

## 2019-07-05 NOTE — Progress Notes (Signed)
PROGRESS NOTE    Desiree Koch  [id number] DOB: 15-Nov-1930 DOA: 07/04/2019 PCP: Adin Hector, MD      Assessment & Plan:   Principal Problem:   Hyperkalemia Active Problems:   Acute kidney injury superimposed on chronic kidney disease (HCC)   AF (paroxysmal atrial fibrillation) (HCC)   Hypothyroidism   CAD (coronary artery disease)   Chronic systolic CHF (congestive heart failure) (HCC)   Other cirrhosis of liver (HCC)   Essential hypertension   Hyperkalemia: in setting of spironolactone and potassium supplementation use & AKI.  EKG has new frequent PVCs when compared to prior. S/p IV calcium gluconate, Kayexalate, NovoLog 5 units/D50, 1 amp bicarb & lokelma. Repeat potassium level ordered. Continue to hold spironolactone. Continue on tele  AKI on CKD stage III: Cr is basically stable from day prior. Likely secondary to medication effect in setting of spironolactone and increased lasix dosing. Continue to hold lasix and spironolactone. Monitor I/Os  Paroxysmal atrial fibrillation/flutter: rate controlled and frequent PVCs.  Not on anticoagulation due to prior history of bleeding. Continue to hold metoprolol due to soft blood pressure. Recently started on digoxin   Chronic systolic CHF: EF 0000000 by TTE 06/16/2019. Appears overall intravascularly volume depleted. Will continue on hold lasix   Cirrhosis: had recent paracentesis on 06/27/2019 with removal of 4 L peritoneal fluid.  Does not appear to have large volume ascites on exam at time of admission.  Holding lasix and spironolactone as above.   Transaminitis: likely secondary to cirrhosis. AST is elevated slightly and ALT is WNL  Hypertension: continue to hold metoprolol,lasix due to soft blood pressure.   CAD: chronic and stable.  Denies any chest pain.   Hypothyroidism: continue Synthroid.   DVT prophylaxis: heparin SQ Code Status: full  Family Communication: discussed pt's care with pt's daughter, Desiree Koch,  and answered her questions Disposition Plan: depends on PT/OT recs   Consultants:      Procedures:    Antimicrobials:    Subjective: Pt c/o malaise  Objective: Vitals:   07/05/19 0052 07/05/19 0500 07/05/19 0612 07/05/19 0812  BP: (!) 108/40  (!) 110/47 115/65  Pulse: 77  99 (!) 44  Resp: (!) 24  16   Temp: 97.8 F (36.6 C)  98.4 F (36.9 C) 97.6 F (36.4 C)  TempSrc: Oral  Oral Oral  SpO2: 99%  98% 100%  Weight:  70.1 kg    Height:        Intake/Output Summary (Last 24 hours) at 07/05/2019 0815 Last data filed at 07/05/2019 0331 Gross per 24 hour  Intake 1003.98 ml  Output 200 ml  Net 803.98 ml   Filed Weights   07/04/19 1620 07/04/19 2156 07/05/19 0500  Weight: 67.4 kg 67 kg 70.1 kg    Examination:  General exam: Appears calm and comfortable  Respiratory system: Clear to auscultation. Respiratory effort normal. Cardiovascular system: irregularly irregular. No rubs, gallops or clicks.  Gastrointestinal system: Abdomen is nondistended, soft and nontender. Normal bowel sounds heard. Central nervous system: Alert and oriented. Moves all 4 extremities Psychiatry: Judgement and insight appear normal. Flat mood and affect    Data Reviewed: I have personally reviewed following labs and imaging studies  CBC: Recent Labs  Lab 07/04/19 1612 07/05/19 0500  WBC 8.7 8.6  HGB 11.8* 10.3*  HCT 36.4 30.0*  MCV 109.0* 104.5*  PLT 214 [id number]   Basic Metabolic Panel: Recent Labs  Lab 07/04/19 1736 07/04/19 2145 07/05/19 0500  NA 129*  --  131*  K 6.3* 5.2* 5.2*  CL 98  --  99  CO2 23  --  22  GLUCOSE 104*  --  89  BUN 35*  --  36*  CREATININE 2.61*  --  2.60*  CALCIUM 9.8  --  9.4  MG 2.2  --  2.1   GFR: Estimated Creatinine Clearance: 13.9 mL/min (A) (by C-G formula based on SCr of 2.6 mg/dL (H)). Liver Function Tests: Recent Labs  Lab 07/04/19 1736  AST 50*  ALT 15  ALKPHOS 87  BILITOT 3.1*  PROT 6.4*  ALBUMIN 3.2*   Recent Labs  Lab  07/04/19 1736  LIPASE 75*   No results for input(s): AMMONIA in the last 168 hours. Coagulation Profile: No results for input(s): INR, PROTIME in the last 168 hours. Cardiac Enzymes: No results for input(s): CKTOTAL, CKMB, CKMBINDEX, TROPONINI in the last 168 hours. BNP (last 3 results) No results for input(s): PROBNP in the last 8760 hours. HbA1C: No results for input(s): HGBA1C in the last 72 hours. CBG: Recent Labs  Lab 07/04/19 1842  GLUCAP 81   Lipid Profile: No results for input(s): CHOL, HDL, LDLCALC, TRIG, CHOLHDL, LDLDIRECT in the last 72 hours. Thyroid Function Tests: No results for input(s): TSH, T4TOTAL, FREET4, T3FREE, THYROIDAB in the last 72 hours. Anemia Panel: No results for input(s): VITAMINB12, FOLATE, FERRITIN, TIBC, IRON, RETICCTPCT in the last 72 hours. Sepsis Labs: No results for input(s): PROCALCITON, LATICACIDVEN in the last 168 hours.  Recent Results (from the past 240 hour(s))  SARS CORONAVIRUS 2 (TAT 6-24 HRS) Nasopharyngeal Nasopharyngeal Swab     Status: None   Collection Time: 07/04/19  4:43 PM   Specimen: Nasopharyngeal Swab  Result Value Ref Range Status   SARS Coronavirus 2 NEGATIVE NEGATIVE Final    Comment: (NOTE) SARS-CoV-2 target nucleic acids are NOT DETECTED. The SARS-CoV-2 RNA is generally detectable in upper and lower respiratory specimens during the acute phase of infection. Negative results do not preclude SARS-CoV-2 infection, do not rule out co-infections with other pathogens, and should not be used as the sole basis for treatment or other patient management decisions. Negative results must be combined with clinical observations, patient history, and epidemiological information. The expected result is Negative. Fact Sheet for Patients: SugarRoll.be Fact Sheet for Healthcare Providers: https://www.woods-mathews.com/ This test is not yet approved or cleared by the [place] FDA and    has been authorized for detection and/or diagnosis of SARS-CoV-2 by FDA under an Emergency Use Authorization (EUA). This EUA will remain  in effect (meaning this test can be used) for the duration of the COVID-19 declaration under Section 56 4(b)(1) of the Act, 21 U.S.C. section 360bbb-3(b)(1), unless the authorization is terminated or revoked sooner. Performed at [facility], [place] [address], [place], [place] [address]   Urine Culture     Status: None (Preliminary result)   Collection Time: 07/04/19  6:32 PM   Specimen: Urine, Clean Catch  Result Value Ref Range Status   Specimen Description   Final    URINE, CLEAN CATCH Performed at [facility], [address] [place], [place] [address]    Special Requests   Final    NONE Performed at [facility], [place] [address], [place], [place] [address]    Culture PENDING  Incomplete   Report Status PENDING  Incomplete         Radiology Studies: DG Chest 2 View  Result Date: 07/04/2019 CLINICAL DATA:  Fatigue for  1 week EXAM: CHEST - 2 VIEW COMPARISON:  06/15/2019 FINDINGS: Cardiomegaly is again identified. The lungs are well aerated bilaterally. Blunting of the right costophrenic angle is noted stable in appearance from the prior exam. No acute infiltrate is seen. No bony abnormality is noted. Degenerative changes in the thoracic spine are seen. IMPRESSION: No active cardiopulmonary disease. Electronically Signed   By: Inez Catalina M.D.   On: 07/04/2019 16:40        Scheduled Meds: . heparin  5,000 Units Subcutaneous Q8H  . levothyroxine  75 mcg Oral Daily   Continuous Infusions:   LOS: 0 days    Time spent: 33 mins    Wyvonnia Dusky, MD [facility] Pager [phone]  If 7PM-7AM, please contact night-coverage [email or web address] 07/05/2019, 8:15 AM

## 2019-07-06 DIAGNOSIS — K7469 Other cirrhosis of liver: Secondary | ICD-10-CM | POA: Diagnosis not present

## 2019-07-06 DIAGNOSIS — N179 Acute kidney failure, unspecified: Secondary | ICD-10-CM | POA: Diagnosis not present

## 2019-07-06 DIAGNOSIS — N189 Chronic kidney disease, unspecified: Secondary | ICD-10-CM | POA: Diagnosis not present

## 2019-07-06 DIAGNOSIS — D539 Nutritional anemia, unspecified: Secondary | ICD-10-CM

## 2019-07-06 LAB — UREA NITROGEN, URINE: Urea Nitrogen, Ur: 658 mg/dL

## 2019-07-06 LAB — URINE CULTURE: Culture: 80000 — AB

## 2019-07-06 LAB — COMPREHENSIVE METABOLIC PANEL
ALT: 13 U/L (ref 0–44)
AST: 43 U/L — ABNORMAL HIGH (ref 15–41)
Albumin: 2.7 g/dL — ABNORMAL LOW (ref 3.5–5.0)
Alkaline Phosphatase: 80 U/L (ref 38–126)
Anion gap: 9 (ref 5–15)
BUN: 41 mg/dL — ABNORMAL HIGH (ref 8–23)
CO2: 22 mmol/L (ref 22–32)
Calcium: 9.1 mg/dL (ref 8.9–10.3)
Chloride: 100 mmol/L (ref 98–111)
Creatinine, Ser: 2.81 mg/dL — ABNORMAL HIGH (ref 0.44–1.00)
GFR calc Af Amer: 17 mL/min — ABNORMAL LOW (ref >60)
GFR calc non Af Amer: 15 mL/min — ABNORMAL LOW (ref >60)
Glucose, Bld: 92 mg/dL (ref 70–99)
Potassium: 5 mmol/L (ref 3.5–5.1)
Sodium: 131 mmol/L — ABNORMAL LOW (ref 135–145)
Total Bilirubin: 2.8 mg/dL — ABNORMAL HIGH (ref 0.3–1.2)
Total Protein: 5.6 g/dL — ABNORMAL LOW (ref 6.5–8.1)

## 2019-07-06 LAB — CBC
HCT: 28.8 % — ABNORMAL LOW (ref 36.0–46.0)
Hemoglobin: 9.8 g/dL — ABNORMAL LOW (ref 12.0–15.0)
MCH: 36.3 pg — ABNORMAL HIGH (ref 26.0–34.0)
MCHC: 34 g/dL (ref 30.0–36.0)
MCV: 106.7 fL — ABNORMAL HIGH (ref 80.0–100.0)
Platelets: 199 10*3/uL (ref 150–400)
RBC: 2.7 MIL/uL — ABNORMAL LOW (ref 3.87–5.11)
RDW: 17.2 % — ABNORMAL HIGH (ref 11.5–15.5)
WBC: 7.9 10*3/uL (ref 4.0–10.5)
nRBC: 0.3 % — ABNORMAL HIGH (ref 0.0–0.2)

## 2019-07-06 MED ORDER — ALBUMIN HUMAN 25 % IV SOLN
50.0000 g | Freq: Once | INTRAVENOUS | Status: AC
  Administered 2019-07-07: 50 g via INTRAVENOUS
  Filled 2019-07-06 (×2): qty 200
  Filled 2019-07-06: qty 100

## 2019-07-06 MED ORDER — DOCUSATE SODIUM 100 MG PO CAPS
200.0000 mg | ORAL_CAPSULE | Freq: Two times a day (BID) | ORAL | Status: DC
  Administered 2019-07-06 – 2019-07-12 (×13): 200 mg via ORAL
  Filled 2019-07-06 (×15): qty 2

## 2019-07-06 NOTE — Evaluation (Signed)
Occupational Therapy Evaluation Patient Details Name: Desiree Koch MRN: [id number] DOB: 04-04-1930 Today's Date: 07/06/2019    History of Present Illness Pt is 86 y/o F with PMH: paroxysmal A. fib/flutter not on AC due to history of bleeding, CAD, CKD stage III, chronic systolic CHF (EF 0000000),  hepatic cirrhosis, HTN, hypothyroidism. Was here 2 WA for LE swelling r/t CHF exacerbation and decompensated cirrhosis with ascites. Presents on this admission d/t generalized weakness. Pt  was found to be hyperkalemic.   Clinical Impression   Pt was seen for OT evaluation this date. Prior to hospital admission, pt was Indep with self care ADLs and MOD I with ADL mobility in community with SPC. Pt lives alone in [facility] with 2 STE. Currently pt demonstrates impairments as described below (See OT problem list) which functionally limit her ability to perform ADL/self-care tasks. Pt currently requires CGA for ADL transfers/mobility with RW d/t decreased standing tolerance and required MOD A for LB ADLs d/t increased abdominal circumference and pain related to tightness at this time.  Pt would benefit from skilled OT to address noted impairments and functional limitations (see below for any additional details) in order to maximize safety and independence while minimizing falls risk and caregiver burden. Upon hospital discharge, recommend HHOT to maximize pt safety and return to functional independence during meaningful occupations of daily life.     Follow Up Recommendations  Home health OT    Equipment Recommendations  3 in 1 bedside commode    Recommendations for Other Services       Precautions / Restrictions Precautions Precautions: Fall Restrictions Weight Bearing Restrictions: No      Mobility Bed Mobility Overal bed mobility: Modified Independent Bed Mobility: Supine to Sit     Supine to sit: Supervision        Transfers Overall transfer level: Needs assistance Equipment used:  Rolling walker (2 wheeled) Transfers: Sit to/from Stand Sit to Stand: Min guard              Balance Overall balance assessment: Modified Independent                                         ADL either performed or assessed with clinical judgement   ADL                                         General ADL Comments: Pt able to perform seated UB ADLs with setup, requires MOD A for LB ADLs at this time d/t decreased ability to bend at the waist d/t abdominal distention/discomfort.     Vision Patient Visual Report: No change from baseline       Perception     Praxis      Pertinent Vitals/Pain Pain Assessment: Faces Faces Pain Scale: Hurts whole lot Pain Location: pt c/o abodminal pain/tightness Pain Descriptors / Indicators: Discomfort;Tightness;Tender Pain Intervention(s): Limited activity within patient's tolerance;Monitored during session;Repositioned;Patient requesting pain meds-RN notified     Hand Dominance     Extremity/Trunk Assessment Upper Extremity Assessment Upper Extremity Assessment: Generalized weakness   Lower Extremity Assessment Lower Extremity Assessment: Generalized weakness       Communication Communication Communication: No difficulties   Cognition Arousal/Alertness: Awake/alert Behavior During Therapy: WFL for tasks assessed/performed Overall Cognitive Status: Within Functional Limits for  tasks assessed                                     General Comments       Exercises Other Exercises Other Exercises: OT facilitates education with pt re: importance of OOB activity and pt verbalized good understanding. Other Exercises: OT facilitates education re: self-advocating when speaking to her care team as pt expresses concerns re: not understanding her diagnosis and the disease process. Pt's concerns include: why she gathers fluid in the abdomen and will it continue to happen. OT refers pt to  write these questions down and inquire with physician.   Shoulder Instructions      Home Living Family/patient expects to be discharged to:: Private residence Living Arrangements: Alone;Other (Comment)(family was apparently going to bring in [facility] aide after last hospitalization, unsure of follow through with this.) Available Help at Discharge: Family;Available PRN/intermittently Type of Home: House Home Access: Stairs to enter [employer] of Steps: 2 Entrance Stairs-Rails: Right Home Layout: One level     Bathroom Shower/Tub: Teacher, early years/pre: Handicapped height     Home Equipment: Environmental consultant - 2 wheels;Cane - single point;Shower seat - built in          Prior Functioning/Environment Level of Independence: Independent        Comments: Pt reports being completely indep with ADLs/IADLs including driving until most recent hospitalization (2WA). MOD I for fxl mobility with SPC use in community at baseline, no AD for home fxl mobility.        OT Problem List: Decreased strength;Decreased activity tolerance;Impaired balance (sitting and/or standing);Decreased knowledge of use of DME or AE;Pain      OT Treatment/Interventions: Self-care/ADL training;Therapeutic exercise;Energy conservation;DME and/or AE instruction;Therapeutic activities;Patient/family education;Balance training    OT Goals(Current goals can be found in the care plan section) Acute Rehab OT Goals Patient Stated Goal: To be able to stay out of the hospital OT Goal Formulation: With patient Time For Goal Achievement: 07/20/19 Potential to Achieve Goals: Good  OT Frequency: Min 2X/week   Barriers to D/C:            Co-evaluation              AM-PAC OT "6 Clicks" Daily Activity     Outcome Measure Help from another person eating meals?: None Help from another person taking care of personal grooming?: A Little Help from another person toileting, which includes using toliet,  bedpan, or urinal?: A Little Help from another person bathing (including washing, rinsing, drying)?: A Lot Help from another person to put on and taking off regular upper body clothing?: A Little Help from another person to put on and taking off regular lower body clothing?: A Lot 6 Click Score: 17   End of Session Equipment Utilized During Treatment: Gait belt;Rolling walker Nurse Communication: Mobility status  Activity Tolerance: Patient tolerated treatment well Patient left: in chair;with call bell/phone within reach;with chair alarm set  OT Visit Diagnosis: Muscle weakness (generalized) (M62.81);Pain Pain - part of body: (abdomen)                Time: [id number] OT Time Calculation (min): 53 min Charges:  OT General Charges $OT Visit: 1 Visit OT Evaluation $OT Eval Moderate Complexity: 1 Mod OT Treatments $Self Care/Home Management : 23-37 mins $Therapeutic Activity: 8-22 mins  Gerrianne Scale, MS, OTR/L ascom [phone] 07/06/19, 3:52 PM

## 2019-07-06 NOTE — Progress Notes (Signed)
[facility]  ROUNDING NOTE   Subjective:   Patient is pleasantly confused.   Objective:  Vital signs in last 24 hours:  Temp:  [98.2 F (36.8 C)-98.3 F (36.8 C)] 98.2 F (36.8 C) (04/08 0404) Pulse Rate:  [41-111] 41 (04/08 0404) Resp:  [16-20] 16 (04/08 0404) BP: (122-129)/(40-64) 122/40 (04/08 0404) SpO2:  [98 %] 98 % (04/08 0404)  Weight change:  Filed Weights   07/04/19 1620 07/04/19 2156 07/05/19 0500  Weight: 67.4 kg 67 kg 70.1 kg    Intake/Output: I/O last 3 completed shifts: In: 1004 [P.O.:240; I.V.:464; IV Piggyback:300] Out: 200 [Urine:200]   Intake/Output this shift:  Total I/O In: 240 [P.O.:240] Out: 100 [Urine:100]  Physical Exam: General: NAD,   Head: Normocephalic, atraumatic. Moist oral mucosal membranes  Eyes: Anicteric, PERRL  Neck: Supple, trachea midline  Lungs:  Clear to auscultation  Heart: Regular rate and rhythm  Abdomen:  +ascites  Extremities: no peripheral edema.  Neurologic: Nonfocal, moving all four extremities  Skin: No lesions        Basic Metabolic Panel: Recent Labs  Lab 07/04/19 1736 07/04/19 2145 07/05/19 0500 07/05/19 1746 07/06/19 0548  NA 129*  --  131*  --  131*  K 6.3* 5.2* 5.2* 5.5* 5.0  CL 98  --  99  --  100  CO2 23  --  22  --  22  GLUCOSE 104*  --  89  --  92  BUN 35*  --  36*  --  41*  CREATININE 2.61*  --  2.60*  --  2.81*  CALCIUM 9.8  --  9.4  --  9.1  MG 2.2  --  2.1  --   --     Liver Function Tests: Recent Labs  Lab 07/04/19 1736 07/06/19 0548  AST 50* 43*  ALT 15 13  ALKPHOS 87 80  BILITOT 3.1* 2.8*  PROT 6.4* 5.6*  ALBUMIN 3.2* 2.7*   Recent Labs  Lab 07/04/19 1736  LIPASE 75*   No results for input(s): AMMONIA in the last 168 hours.  CBC: Recent Labs  Lab 07/04/19 1612 07/05/19 0500 07/06/19 0548  WBC 8.7 8.6 7.9  HGB 11.8* 10.3* 9.8*  HCT 36.4 30.0* 28.8*  MCV 109.0* 104.5* 106.7*  PLT 214 212 199    Cardiac Enzymes: No results for input(s):  CKTOTAL, CKMB, CKMBINDEX, TROPONINI in the last 168 hours.  BNP: Invalid input(s): POCBNP  CBG: Recent Labs  Lab 07/04/19 1842  GLUCAP 64    Microbiology: Results for orders placed or performed during the hospital encounter of 07/04/19  SARS CORONAVIRUS 2 (TAT 6-24 HRS) Nasopharyngeal Nasopharyngeal Swab     Status: None   Collection Time: 07/04/19  4:43 PM   Specimen: Nasopharyngeal Swab  Result Value Ref Range Status   SARS Coronavirus 2 NEGATIVE NEGATIVE Final    Comment: (NOTE) SARS-CoV-2 target nucleic acids are NOT DETECTED. The SARS-CoV-2 RNA is generally detectable in upper and lower respiratory specimens during the acute phase of infection. Negative results do not preclude SARS-CoV-2 infection, do not rule out co-infections with other pathogens, and should not be used as the sole basis for treatment or other patient management decisions. Negative results must be combined with clinical observations, patient history, and epidemiological information. The expected result is Negative. Fact Sheet for Patients: SugarRoll.be Fact Sheet for Healthcare Providers: https://www.woods-mathews.com/ This test is not yet approved or cleared by the [place] FDA and  has been authorized for detection and/or  diagnosis of SARS-CoV-2 by FDA under an Emergency Use Authorization (EUA). This EUA will remain  in effect (meaning this test can be used) for the duration of the COVID-19 declaration under Section 56 4(b)(1) of the Act, 21 U.S.C. section 360bbb-3(b)(1), unless the authorization is terminated or revoked sooner. Performed at [facility], [place] [address], [place], [place] [address]   Urine Culture     Status: Abnormal   Collection Time: 07/04/19  6:32 PM   Specimen: Urine, Clean Catch  Result Value Ref Range Status   Specimen Description   Final    URINE, CLEAN CATCH Performed at [facility], [address],  [place], [place] [address]    Special Requests   Final    NONE Performed at [facility], [place] [address], [place], [place] [address]    Culture (A)  Final    80,000 COLONIES/mL MULTIPLE SPECIES PRESENT, SUGGEST RECOLLECTION   Report Status 07/06/2019 FINAL  Final    Coagulation Studies: No results for input(s): LABPROT, INR in the last 72 hours.  Urinalysis: Recent Labs    07/04/19 1832  COLORURINE AMBER*  LABSPEC 1.016  PHURINE 5.0  GLUCOSEU NEGATIVE  HGBUR NEGATIVE  BILIRUBINUR NEGATIVE  KETONESUR NEGATIVE  PROTEINUR NEGATIVE  NITRITE NEGATIVE  LEUKOCYTESUR LARGE*      Imaging: DG Chest 2 View  Result Date: 07/04/2019 CLINICAL DATA:  Fatigue for 1 week EXAM: CHEST - 2 VIEW COMPARISON:  06/15/2019 FINDINGS: Cardiomegaly is again identified. The lungs are well aerated bilaterally. Blunting of the right costophrenic angle is noted stable in appearance from the prior exam. No acute infiltrate is seen. No bony abnormality is noted. Degenerative changes in the thoracic spine are seen. IMPRESSION: No active cardiopulmonary disease. Electronically Signed   By: Inez Catalina M.D.   On: 07/04/2019 16:40     Medications:   . albumin human     . docusate sodium  200 mg Oral BID  . heparin  5,000 Units Subcutaneous Q8H  . levothyroxine  75 mcg Oral Daily  . pantoprazole  20 mg Oral Daily   acetaminophen **OR** acetaminophen, ondansetron **OR** ondansetron (ZOFRAN) IV  Assessment/ Plan:  Desiree Koch is a 86 y.o. white female withsystolic congestive heart failure, atrial fibrillation, hypertension, hypothyroidism, hepatic cirrhosis, coronary artery diseaseadmitted on 07/04/2019 for Hyperkalemia [E87.5] Ventricular ectopy [I49.3] Acute renal failure superimposed on chronic kidney disease, unspecified CKD stage, unspecified acute renal failure type ([place]) [N17.9, N18.9]  1. Acute renal failure with hyperkalemia on chronic kidney disease stage IIIB: baseline creatinine of  1.4, GFR of 36 on 04/13/2019.  Chronic Kidney Disease most likely due to hypertensive nephrosclerosis Acute renal failure secondary to over diuresis.  Continue to hold diuretics  2. Hypotension: blood pressure is at goal. On midodrine at home. Hold for now. Midodrine can also help patient's ascites  3. Cirrhosis with ascites:  Paracentesis for today   LOS: 1 Kursten Kruk 4/8/20214:32 PM

## 2019-07-06 NOTE — Evaluation (Signed)
Physical Therapy Evaluation Patient Details Name: Desiree Koch MRN: [id number] DOB: 05/07/1930 Today's Date: 07/06/2019   History of Present Illness  Pt is 86 y/o F with PMH: paroxysmal A. fib/flutter not on AC due to history of bleeding, CAD, CKD stage III, chronic systolic CHF (EF 0000000),  hepatic cirrhosis, HTN, hypothyroidism. Was here 2 WA for LE swelling r/t CHF exacerbation and decompensated cirrhosis with ascites. Presents on this admission d/t generalized weakness. Pt  was found to be hyperkalemic.  Clinical Impression  Patient resting in bed upon arrival to room; easily awakens to voice and light touch.  Alert and oriented to basic information; mild STM deficits evident, with patient asking questions/retelling same story multiple times (without awareness) during session.  Denies pain, but does endorse generalized discomfort/heaviness in abdomen due to fluid accumulation.  Bilat UE/LE strength and ROM grossly symmetrical and WFL; no focal weakness appreciated.  Able to complete bed mobility with mod indep; sit/stand, basic transfers and gait (200') with RW, cga/close sup.  Demonstrates reciprocal stepping pattern with fair gait speed; steady, without buckling or LOB.  Does benefit from use of RW for optimal support and overall energy conservation. Mild SOB with exertion; sats 96% RA, HR 120s; recovers 100-110s with seated rest. Would benefit from skilled PT to address above deficits and promote optimal return to PLOF.; will maintain on caseload remaining stay to ensure progressive mobility, but anticipate no formal PT needs upon discharge.    Follow Up Recommendations (anticipate no formal PT needs upon discharge)    Equipment Recommendations  Rolling walker with 5" wheels    Recommendations for Other Services       Precautions / Restrictions Precautions Precautions: Fall Restrictions Weight Bearing Restrictions: No      Mobility  Bed Mobility Overal bed mobility: Modified  Independent Bed Mobility: Supine to Sit     Supine to sit: Supervision        Transfers Overall transfer level: Needs assistance Equipment used: Rolling walker (2 wheeled) Transfers: Sit to/from Stand Sit to Stand: Min guard;Supervision         General transfer comment: cuing for UE placement to prevent pulling on RW  Ambulation/Gait Ambulation/Gait assistance: Supervision Gait Distance (Feet): 200 Feet Assistive device: Rolling walker (2 wheeled)   Gait velocity: 10' walk time, 11 seconds   General Gait Details: reciprocal stepping pattern with fair gait speed; steady, without buckling or LOB.  Does benefit from use of RW for optimal support and overall energy conservation. Mild SOB with exertion; sats 96% RA, HR 120s  Stairs            Wheelchair Mobility    Modified Rankin (Stroke Patients Only)       Balance Overall balance assessment: Needs assistance Sitting-balance support: No upper extremity supported;Feet supported Sitting balance-Leahy Scale: Good     Standing balance support: Bilateral upper extremity supported Standing balance-Leahy Scale: Fair                               Pertinent Vitals/Pain Pain Assessment: No/denies pain Faces Pain Scale: Hurts whole lot Pain Location: denies pain, does report discomfort/heaviness due to abdominal fluid Pain Descriptors / Indicators: Discomfort;Tightness;Tender Pain Intervention(s): Limited activity within patient's tolerance;Monitored during session;Repositioned;Patient requesting pain meds-RN notified    Home Living Family/patient expects to be discharged to:: Private residence Living Arrangements: Alone;Other (Comment) Available Help at Discharge: Family;Available PRN/intermittently Type of Home: House Home Access: Stairs to  enter Entrance Stairs-Rails: Right Entrance Stairs-Number of Steps: 2 Home Layout: One level Home Equipment: Walker - 2 wheels;Cane - single point;Shower seat  - built in      Prior Function Level of Independence: Independent         Comments: Pt reports being completely indep with ADLs/IADLs including driving until most recent hospitalization (2WA). MOD I for fxl mobility with SPC use in community at baseline, no AD for home fxl mobility.     Hand Dominance        Extremity/Trunk Assessment   Upper Extremity Assessment Upper Extremity Assessment: Overall WFL for tasks assessed(grossly 4/5 throughout)    Lower Extremity Assessment Lower Extremity Assessment: Overall WFL for tasks assessed(grossly 4/5 throughout)       Communication   Communication: No difficulties  Cognition Arousal/Alertness: Awake/alert Behavior During Therapy: WFL for tasks assessed/performed Overall Cognitive Status: Within Functional Limits for tasks assessed                                 General Comments: decreased STM, as patient asks similar questions, repeats stories multiple times during session      General Comments      Exercises Other Exercises Other Exercises: OT facilitates education with pt re: importance of OOB activity and pt verbalized good understanding. Other Exercises: OT facilitates education re: self-advocating when speaking to her care team as pt expresses concerns re: not understanding her diagnosis and the disease process. Pt's concerns include: why she gathers fluid in the abdomen and will it continue to happen. OT refers pt to write these questions down and inquire with physician.   Assessment/Plan    PT Assessment Patient needs continued PT services  PT Problem List Decreased strength;Decreased mobility;Cardiopulmonary status limiting activity;Decreased activity tolerance;Decreased balance       PT Treatment Interventions DME instruction;Therapeutic exercise;Balance training;Gait training;Stair training;Neuromuscular re-education;Therapeutic activities;Patient/family education    PT Goals (Current goals can  be found in the Care Plan section)  Acute Rehab PT Goals Patient Stated Goal: to figure out why all this fluid keeps coming back PT Goal Formulation: With patient Time For Goal Achievement: 07/20/19 Potential to Achieve Goals: Good    Frequency Min 2X/week   Barriers to discharge        Co-evaluation               AM-PAC PT "6 Clicks" Mobility  Outcome Measure Help needed turning from your back to your side while in a flat bed without using bedrails?: None Help needed moving from lying on your back to sitting on the side of a flat bed without using bedrails?: None Help needed moving to and from a bed to a chair (including a wheelchair)?: None Help needed standing up from a chair using your arms (e.g., wheelchair or bedside chair)?: None Help needed to walk in hospital room?: A Little Help needed climbing 3-5 steps with a railing? : A Little 6 Click Score: 22    End of Session Equipment Utilized During Treatment: Gait belt Activity Tolerance: Patient tolerated treatment well Patient left: in bed;with call bell/phone within reach;with bed alarm set Nurse Communication: Mobility status PT Visit Diagnosis: Muscle weakness (generalized) (M62.81)    Time: [id number] PT Time Calculation (min) (ACUTE ONLY): 27 min   Charges:   PT Evaluation $PT Eval Moderate Complexity: 1 Mod          Ember Gottwald H. Owens Shark, PT, DPT, NCS 07/06/19,  4:43 PM [phone]

## 2019-07-06 NOTE — Progress Notes (Signed)
PROGRESS NOTE    Desiree Koch  [id number] DOB: 06-16-1930 DOA: 07/04/2019 PCP: Adin Hector, MD      Assessment & Plan:   Principal Problem:   Hyperkalemia Active Problems:   Acute kidney injury superimposed on chronic kidney disease (HCC)   AF (paroxysmal atrial fibrillation) (HCC)   Hypothyroidism   CAD (coronary artery disease)   Chronic systolic CHF (congestive heart failure) (HCC)   Other cirrhosis of liver (HCC)   Essential hypertension   Hyperkalemia: in setting of spironolactone and potassium supplementation use & AKI.  EKG has new frequent PVCs when compared to prior. S/p IV calcium gluconate, Kayexalate, NovoLog 5 units/D50, 1 amp bicarb & lokelma.  Continue to hold spironolactone. Continue on tele. Resolved  AKI on CKD stage III: Cr is trending up from day prior. Likely secondary to medication effect in setting of spironolactone and increased lasix dosing. Continue to hold lasix and spironolactone. Monitor I/Os. Nephro following and recs apprec  Paroxysmal atrial fibrillation/flutter: rate controlled and frequent PVCs.  Not on anticoagulation due to prior history of bleeding. Continue to hold metoprolol due to soft blood pressure. Recently started on digoxin   Chronic systolic CHF: EF 0000000 by TTE 06/16/2019. Appears overall intravascularly volume depleted. Will continue on hold lasix   Cirrhosis: had recent paracentesis on 06/27/2019 with removal of 4 L peritoneal fluid. Abd pain today. Ordered a paracentesis today and will give albumin after. Holding lasix and spironolactone as above.   Transaminitis: likely secondary to cirrhosis. AST is elevated slightly but trending down today and ALT is WNL  Macrocytic anemia: likely secondary to cirrhosis. No need for a transfusion at this time   Hypertension: continue to hold metoprolol ,lasix due to low normal blood pressure.   CAD: chronic and stable.  Denies any chest pain.   Hypothyroidism: continue  synthroid.   DVT prophylaxis: heparin SQ Code Status: full  Family Communication: discussed pt's care with pt's daughter, [name], and answered her questions Disposition Plan: depends on PT/OT recs   Consultants:      Procedures:    Antimicrobials:    Subjective: Pt c/o abd pain  Objective: Vitals:   07/05/19 1500 07/05/19 2006 07/05/19 2007 07/06/19 0404  BP: (!) 101/56 129/64  (!) 122/40  Pulse: (!) 46 (!) 111 70 (!) 41  Resp:  20  16  Temp:  98.3 F (36.8 C)  98.2 F (36.8 C)  TempSrc:  Oral  Oral  SpO2:  98% 98% 98%  Weight:      Height:       No intake or output data in the 24 hours ending 07/06/19 0755 Filed Weights   07/04/19 1620 07/04/19 2156 07/05/19 0500  Weight: 67.4 kg 67 kg 70.1 kg    Examination:  General exam: Appears calm and comfortable  Respiratory system: Clear to auscultation. No wheezes, rales Cardiovascular system: irregularly irregular. No rubs, gallops or clicks.  Gastrointestinal system: Abdomen is distended, soft and nontender. Hypoactive bowel sounds heard. Central nervous system: Alert and oriented. Moves all 4 extremities Psychiatry: Judgement and insight appear abnormal. Flat mood and affect    Data Reviewed: I have personally reviewed following labs and imaging studies  CBC: Recent Labs  Lab 07/04/19 1612 07/05/19 0500 07/06/19 0548  WBC 8.7 8.6 7.9  HGB 11.8* 10.3* 9.8*  HCT 36.4 30.0* 28.8*  MCV 109.0* 104.5* 106.7*  PLT 214 212 [id number]   Basic Metabolic Panel: Recent Labs  Lab 07/04/19 1736 07/04/19 2145 07/05/19 0500  07/05/19 1746 07/06/19 0548  NA 129*  --  131*  --  131*  K 6.3* 5.2* 5.2* 5.5* 5.0  CL 98  --  99  --  100  CO2 23  --  22  --  22  GLUCOSE 104*  --  89  --  92  BUN 35*  --  36*  --  41*  CREATININE 2.61*  --  2.60*  --  2.81*  CALCIUM 9.8  --  9.4  --  9.1  MG 2.2  --  2.1  --   --    GFR: Estimated Creatinine Clearance: 12.9 mL/min (A) (by C-G formula based on SCr of 2.81 mg/dL  (H)). Liver Function Tests: Recent Labs  Lab 07/04/19 1736 07/06/19 0548  AST 50* 43*  ALT 15 13  ALKPHOS 87 80  BILITOT 3.1* 2.8*  PROT 6.4* 5.6*  ALBUMIN 3.2* 2.7*   Recent Labs  Lab 07/04/19 1736  LIPASE 75*   No results for input(s): AMMONIA in the last 168 hours. Coagulation Profile: No results for input(s): INR, PROTIME in the last 168 hours. Cardiac Enzymes: No results for input(s): CKTOTAL, CKMB, CKMBINDEX, TROPONINI in the last 168 hours. BNP (last 3 results) No results for input(s): PROBNP in the last 8760 hours. HbA1C: No results for input(s): HGBA1C in the last 72 hours. CBG: Recent Labs  Lab 07/04/19 1842  GLUCAP 81   Lipid Profile: No results for input(s): CHOL, HDL, LDLCALC, TRIG, CHOLHDL, LDLDIRECT in the last 72 hours. Thyroid Function Tests: No results for input(s): TSH, T4TOTAL, FREET4, T3FREE, THYROIDAB in the last 72 hours. Anemia Panel: No results for input(s): VITAMINB12, FOLATE, FERRITIN, TIBC, IRON, RETICCTPCT in the last 72 hours. Sepsis Labs: No results for input(s): PROCALCITON, LATICACIDVEN in the last 168 hours.  Recent Results (from the past 240 hour(s))  SARS CORONAVIRUS 2 (TAT 6-24 HRS) Nasopharyngeal Nasopharyngeal Swab     Status: None   Collection Time: 07/04/19  4:43 PM   Specimen: Nasopharyngeal Swab  Result Value Ref Range Status   SARS Coronavirus 2 NEGATIVE NEGATIVE Final    Comment: (NOTE) SARS-CoV-2 target nucleic acids are NOT DETECTED. The SARS-CoV-2 RNA is generally detectable in upper and lower respiratory specimens during the acute phase of infection. Negative results do not preclude SARS-CoV-2 infection, do not rule out co-infections with other pathogens, and should not be used as the sole basis for treatment or other patient management decisions. Negative results must be combined with clinical observations, patient history, and epidemiological information. The expected result is Negative. Fact Sheet for  Patients: SugarRoll.be Fact Sheet for Healthcare Providers: https://www.woods-mathews.com/ This test is not yet approved or cleared by the [place] FDA and  has been authorized for detection and/or diagnosis of SARS-CoV-2 by FDA under an Emergency Use Authorization (EUA). This EUA will remain  in effect (meaning this test can be used) for the duration of the COVID-19 declaration under Section 56 4(b)(1) of the Act, 21 U.S.C. section 360bbb-3(b)(1), unless the authorization is terminated or revoked sooner. Performed at [facility], [place] [address], [place], [place] [address]   Urine Culture     Status: None (Preliminary result)   Collection Time: 07/04/19  6:32 PM   Specimen: Urine, Clean Catch  Result Value Ref Range Status   Specimen Description   Final    URINE, CLEAN CATCH Performed at [facility], [address], [place], [place] [address]    Special Requests   Final  NONE Performed at [facility], [place] [address], [place], [place] [address]    Culture PENDING  Incomplete   Report Status PENDING  Incomplete         Radiology Studies: DG Chest 2 View  Result Date: 07/04/2019 CLINICAL DATA:  Fatigue for 1 week EXAM: CHEST - 2 VIEW COMPARISON:  06/15/2019 FINDINGS: Cardiomegaly is again identified. The lungs are well aerated bilaterally. Blunting of the right costophrenic angle is noted stable in appearance from the prior exam. No acute infiltrate is seen. No bony abnormality is noted. Degenerative changes in the thoracic spine are seen. IMPRESSION: No active cardiopulmonary disease. Electronically Signed   By: Inez Catalina M.D.   On: 07/04/2019 16:40        Scheduled Meds: . heparin  5,000 Units Subcutaneous Q8H  . levothyroxine  75 mcg Oral Daily  . pantoprazole  20 mg Oral Daily   Continuous Infusions:   LOS: 1 day    Time spent: 35 mins    Wyvonnia Dusky, MD [facility] Pager [phone]  If 7PM-7AM, please contact night-coverage [email or web address] 07/06/2019, 7:55 AM

## 2019-07-07 ENCOUNTER — Inpatient Hospital Stay: Payer: Medicare HMO

## 2019-07-07 DIAGNOSIS — E875 Hyperkalemia: Secondary | ICD-10-CM | POA: Diagnosis not present

## 2019-07-07 LAB — CBC
HCT: 30.5 % — ABNORMAL LOW (ref 36.0–46.0)
Hemoglobin: 10.6 g/dL — ABNORMAL LOW (ref 12.0–15.0)
MCH: 35.6 pg — ABNORMAL HIGH (ref 26.0–34.0)
MCHC: 34.8 g/dL (ref 30.0–36.0)
MCV: 102.3 fL — ABNORMAL HIGH (ref 80.0–100.0)
Platelets: 206 10*3/uL (ref 150–400)
RBC: 2.98 MIL/uL — ABNORMAL LOW (ref 3.87–5.11)
RDW: 16.7 % — ABNORMAL HIGH (ref 11.5–15.5)
WBC: 8.2 10*3/uL (ref 4.0–10.5)
nRBC: 0 % (ref 0.0–0.2)

## 2019-07-07 LAB — COMPREHENSIVE METABOLIC PANEL
ALT: 13 U/L (ref 0–44)
AST: 43 U/L — ABNORMAL HIGH (ref 15–41)
Albumin: 2.8 g/dL — ABNORMAL LOW (ref 3.5–5.0)
Alkaline Phosphatase: 83 U/L (ref 38–126)
Anion gap: 10 (ref 5–15)
BUN: 43 mg/dL — ABNORMAL HIGH (ref 8–23)
CO2: 20 mmol/L — ABNORMAL LOW (ref 22–32)
Calcium: 9 mg/dL (ref 8.9–10.3)
Chloride: 99 mmol/L (ref 98–111)
Creatinine, Ser: 2.9 mg/dL — ABNORMAL HIGH (ref 0.44–1.00)
GFR calc Af Amer: 16 mL/min — ABNORMAL LOW (ref >60)
GFR calc non Af Amer: 14 mL/min — ABNORMAL LOW (ref >60)
Glucose, Bld: 99 mg/dL (ref 70–99)
Potassium: 4.9 mmol/L (ref 3.5–5.1)
Sodium: 129 mmol/L — ABNORMAL LOW (ref 135–145)
Total Bilirubin: 2.9 mg/dL — ABNORMAL HIGH (ref 0.3–1.2)
Total Protein: 5.8 g/dL — ABNORMAL LOW (ref 6.5–8.1)

## 2019-07-07 MED ORDER — LACTULOSE 10 GM/15ML PO SOLN: 30.0000 g | Freq: Two times a day (BID) | ORAL | Status: DC | PRN

## 2019-07-07 NOTE — Progress Notes (Signed)
PROGRESS NOTE    Desiree Koch  [id number] DOB: 09/18/1930 DOA: 07/04/2019 PCP: Adin Hector, MD      Assessment & Plan:   Principal Problem:   Hyperkalemia Active Problems:   Acute kidney injury superimposed on chronic kidney disease (HCC)   AF (paroxysmal atrial fibrillation) (HCC)   Hypothyroidism   CAD (coronary artery disease)   Chronic systolic CHF (congestive heart failure) (HCC)   Other cirrhosis of liver (HCC)   Essential hypertension   Hyperkalemia: in setting of spironolactone and potassium supplementation use & AKI.  EKG has new frequent PVCs when compared to prior. S/p IV calcium gluconate, Kayexalate, NovoLog 5 units/D50, 1 amp bicarb & lokelma.  Continue to hold spironolactone. Continue on tele. Resolved  AKI on CKD stage III: Cr is trending up from day prior. Likely secondary to medication effect in setting of spironolactone and increased lasix dosing. Continue to hold lasix and spironolactone. Monitor I/Os. Nephro following and recs apprec  Paroxysmal atrial fibrillation/flutter: rate controlled and frequent PVCs.  Not on anticoagulation due to prior history of bleeding. Continue to hold metoprolol due to soft blood pressure. Recently started on digoxin   Chronic systolic CHF: EF 0000000 by TTE 06/16/2019. Appears overall intravascularly volume depleted. Will continue on hold lasix   Cirrhosis: had recent paracentesis on 06/27/2019 with removal of 4 L peritoneal fluid. Abd pain today. S/p paracentesis 07/07/19 w/ 4L and will give albumin after. Holding lasix and spironolactone as above.   Transaminitis: likely secondary to cirrhosis. AST is stable from day prior and ALT is WNL  Hyponatremia: likely secondary to cirrhosis. Will continue to monitor   Macrocytic anemia: likely secondary to cirrhosis. No need for a transfusion at this time   Hypertension: continue to hold metoprolol ,lasix due to low normal blood pressure.   CAD: chronic and stable.   Denies any chest pain.   Hypothyroidism: continue synthroid.   DVT prophylaxis: heparin SQ Code Status: full  Family Communication: discussed pt's care with pt's daughter, [name], and answered her questions Disposition Plan: will likely d/c w/ home health. Home health orders will be placed. Unlikely no barriers to d/c   Consultants:      Procedures:    Antimicrobials:    Subjective: Pt c/o abd pain  Objective: Vitals:   07/06/19 0404 07/06/19 1711 07/06/19 1924 07/07/19 0420  BP: (!) 122/40 (!) 131/47 (!) 121/47 (!) 134/57  Pulse: (!) 41 60 61 (!) 59  Resp: 16  16 18   Temp: 98.2 F (36.8 C)  (!) 97.3 F (36.3 C) 97.7 F (36.5 C)  TempSrc: Oral  Oral Oral  SpO2: 98% 100% 99% 97%  Weight:    67.8 kg  Height:        Intake/Output Summary (Last 24 hours) at 07/07/2019 0749 Last data filed at 07/06/2019 1904 Gross per 24 hour  Intake 480 ml  Output 100 ml  Net 380 ml   Filed Weights   07/04/19 2156 07/05/19 0500 07/07/19 0420  Weight: 67 kg 70.1 kg 67.8 kg    Examination:  General exam: Appears calm and comfortable  Respiratory system: Clear to auscultation. No accessory muscle use Cardiovascular system: irregularly irregular. No rubs, gallops or clicks.  Gastrointestinal system: Abdomen is distended, soft and nontender. Hypoactive bowel sounds heard. Central nervous system: Alert and oriented. Moves all 4 extremities Psychiatry: Judgement and insight appear abnormal. Flat mood and affect    Data Reviewed: I have personally reviewed following labs and imaging studies  CBC: Recent Labs  Lab 07/04/19 1612 07/05/19 0500 07/06/19 0548 07/07/19 0446  WBC 8.7 8.6 7.9 8.2  HGB 11.8* 10.3* 9.8* 10.6*  HCT 36.4 30.0* 28.8* 30.5*  MCV 109.0* 104.5* 106.7* 102.3*  PLT 214 212 199 [id number]   Basic Metabolic Panel: Recent Labs  Lab 07/04/19 1736 07/04/19 1736 07/04/19 2145 07/05/19 0500 07/05/19 1746 07/06/19 0548 07/07/19 0446  NA 129*  --   --  131*  --   131* 129*  K 6.3*   < > 5.2* 5.2* 5.5* 5.0 4.9  CL 98  --   --  99  --  100 99  CO2 23  --   --  22  --  22 20*  GLUCOSE 104*  --   --  89  --  92 99  BUN 35*  --   --  36*  --  41* 43*  CREATININE 2.61*  --   --  2.60*  --  2.81* 2.90*  CALCIUM 9.8  --   --  9.4  --  9.1 9.0  MG 2.2  --   --  2.1  --   --   --    < > = values in this interval not displayed.   GFR: Estimated Creatinine Clearance: 12.3 mL/min (A) (by C-G formula based on SCr of 2.9 mg/dL (H)). Liver Function Tests: Recent Labs  Lab 07/04/19 1736 07/06/19 0548 07/07/19 0446  AST 50* 43* 43*  ALT 15 13 13   ALKPHOS 87 80 83  BILITOT 3.1* 2.8* 2.9*  PROT 6.4* 5.6* 5.8*  ALBUMIN 3.2* 2.7* 2.8*   Recent Labs  Lab 07/04/19 1736  LIPASE 75*   No results for input(s): AMMONIA in the last 168 hours. Coagulation Profile: No results for input(s): INR, PROTIME in the last 168 hours. Cardiac Enzymes: No results for input(s): CKTOTAL, CKMB, CKMBINDEX, TROPONINI in the last 168 hours. BNP (last 3 results) No results for input(s): PROBNP in the last 8760 hours. HbA1C: No results for input(s): HGBA1C in the last 72 hours. CBG: Recent Labs  Lab 07/04/19 1842  GLUCAP 81   Lipid Profile: No results for input(s): CHOL, HDL, LDLCALC, TRIG, CHOLHDL, LDLDIRECT in the last 72 hours. Thyroid Function Tests: No results for input(s): TSH, T4TOTAL, FREET4, T3FREE, THYROIDAB in the last 72 hours. Anemia Panel: No results for input(s): VITAMINB12, FOLATE, FERRITIN, TIBC, IRON, RETICCTPCT in the last 72 hours. Sepsis Labs: No results for input(s): PROCALCITON, LATICACIDVEN in the last 168 hours.  Recent Results (from the past 240 hour(s))  SARS CORONAVIRUS 2 (TAT 6-24 HRS) Nasopharyngeal Nasopharyngeal Swab     Status: None   Collection Time: 07/04/19  4:43 PM   Specimen: Nasopharyngeal Swab  Result Value Ref Range Status   SARS Coronavirus 2 NEGATIVE NEGATIVE Final    Comment: (NOTE) SARS-CoV-2 target nucleic acids are  NOT DETECTED. The SARS-CoV-2 RNA is generally detectable in upper and lower respiratory specimens during the acute phase of infection. Negative results do not preclude SARS-CoV-2 infection, do not rule out co-infections with other pathogens, and should not be used as the sole basis for treatment or other patient management decisions. Negative results must be combined with clinical observations, patient history, and epidemiological information. The expected result is Negative. Fact Sheet for Patients: SugarRoll.be Fact Sheet for Healthcare Providers: https://www.woods-mathews.com/ This test is not yet approved or cleared by the [place] FDA and  has been authorized for detection and/or diagnosis of SARS-CoV-2 by FDA under an Emergency Use  Authorization (EUA). This EUA will remain  in effect (meaning this test can be used) for the duration of the COVID-19 declaration under Section 56 4(b)(1) of the Act, 21 U.S.C. section 360bbb-3(b)(1), unless the authorization is terminated or revoked sooner. Performed at [facility], [place] [address] [place], [place] [address]   Urine Culture     Status: Abnormal   Collection Time: 07/04/19  6:32 PM   Specimen: Urine, Clean Catch  Result Value Ref Range Status   Specimen Description   Final    URINE, CLEAN CATCH Performed at [facility], [address], [place], [place] [address]    Special Requests   Final    NONE Performed at [facility], [place] [address] [place],  [address]    Culture (A)  Final    80,000 COLONIES/mL MULTIPLE SPECIES PRESENT, SUGGEST RECOLLECTION   Report Status 07/06/2019 FINAL  Final         Radiology Studies: No results found.      Scheduled Meds: . docusate sodium  200 mg Oral BID  . heparin  5,000 Units Subcutaneous Q8H  . levothyroxine  75 mcg Oral Daily  . pantoprazole  20 mg Oral Daily   Continuous Infusions: . albumin human        LOS: 2 days    Time spent: 35 mins    Wyvonnia Dusky, MD [facility] Pager [phone]  If 7PM-7AM, please contact night-coverage [email or web address] 07/07/2019, 7:49 AM

## 2019-07-07 NOTE — Progress Notes (Signed)
[facility]  ROUNDING NOTE   Subjective:   Large volume paracentesis 4 liters removed.   Objective:  Vital signs in last 24 hours:  Temp:  [97.3 F (36.3 C)-97.7 F (36.5 C)] 97.7 F (36.5 C) (04/09 0420) Pulse Rate:  [59-61] 59 (04/09 0420) Resp:  [16-18] 18 (04/09 0420) BP: (121-137)/(47-63) 131/58 (04/09 1119) SpO2:  [96 %-100 %] 96 % (04/09 1119) Weight:  [67.8 kg] 67.8 kg (04/09 0420)  Weight change:  Filed Weights   07/04/19 2156 07/05/19 0500 07/07/19 0420  Weight: 67 kg 70.1 kg 67.8 kg    Intake/Output: I/O last 3 completed shifts: In: 480 [P.O.:480] Out: 100 [Urine:100]   Intake/Output this shift:  No intake/output data recorded.  Physical Exam: General: NAD,   Head: Normocephalic, atraumatic. Moist oral mucosal membranes  Eyes: Anicteric, PERRL  Neck: Supple, trachea midline  Lungs:  Clear to auscultation  Heart: Regular rate and rhythm  Abdomen:  +ascites  Extremities: no peripheral edema.  Neurologic: Nonfocal, moving all four extremities  Skin: No lesions        Basic Metabolic Panel: Recent Labs  Lab 07/04/19 1736 07/04/19 1736 07/04/19 2145 07/05/19 0500 07/05/19 1746 07/06/19 0548 07/07/19 0446  NA 129*  --   --  131*  --  131* 129*  K 6.3*   < > 5.2* 5.2* 5.5* 5.0 4.9  CL 98  --   --  99  --  100 99  CO2 23  --   --  22  --  22 20*  GLUCOSE 104*  --   --  89  --  92 99  BUN 35*  --   --  36*  --  41* 43*  CREATININE 2.61*  --   --  2.60*  --  2.81* 2.90*  CALCIUM 9.8   < >  --  9.4  --  9.1 9.0  MG 2.2  --   --  2.1  --   --   --    < > = values in this interval not displayed.    Liver Function Tests: Recent Labs  Lab 07/04/19 1736 07/06/19 0548 07/07/19 0446  AST 50* 43* 43*  ALT 15 13 13   ALKPHOS 87 80 83  BILITOT 3.1* 2.8* 2.9*  PROT 6.4* 5.6* 5.8*  ALBUMIN 3.2* 2.7* 2.8*   Recent Labs  Lab 07/04/19 1736  LIPASE 75*   No results for input(s): AMMONIA in the last 168 hours.  CBC: Recent Labs   Lab 07/04/19 1612 07/05/19 0500 07/06/19 0548 07/07/19 0446  WBC 8.7 8.6 7.9 8.2  HGB 11.8* 10.3* 9.8* 10.6*  HCT 36.4 30.0* 28.8* 30.5*  MCV 109.0* 104.5* 106.7* 102.3*  PLT 214 212 199 206    Cardiac Enzymes: No results for input(s): CKTOTAL, CKMB, CKMBINDEX, TROPONINI in the last 168 hours.  BNP: Invalid input(s): POCBNP  CBG: Recent Labs  Lab 07/04/19 1842  GLUCAP 19    Microbiology: Results for orders placed or performed during the hospital encounter of 07/04/19  SARS CORONAVIRUS 2 (TAT 6-24 HRS) Nasopharyngeal Nasopharyngeal Swab     Status: None   Collection Time: 07/04/19  4:43 PM   Specimen: Nasopharyngeal Swab  Result Value Ref Range Status   SARS Coronavirus 2 NEGATIVE NEGATIVE Final    Comment: (NOTE) SARS-CoV-2 target nucleic acids are NOT DETECTED. The SARS-CoV-2 RNA is generally detectable in upper and lower respiratory specimens during the acute phase of infection. Negative results do not preclude SARS-CoV-2 infection,  do not rule out co-infections with other pathogens, and should not be used as the sole basis for treatment or other patient management decisions. Negative results must be combined with clinical observations, patient history, and epidemiological information. The expected result is Negative. Fact Sheet for Patients: SugarRoll.be Fact Sheet for Healthcare Providers: https://www.woods-mathews.com/ This test is not yet approved or cleared by the [place] FDA and  has been authorized for detection and/or diagnosis of SARS-CoV-2 by FDA under an Emergency Use Authorization (EUA). This EUA will remain  in effect (meaning this test can be used) for the duration of the COVID-19 declaration under Section 56 4(b)(1) of the Act, 21 U.S.C. section 360bbb-3(b)(1), unless the authorization is terminated or revoked sooner. Performed at [facility], [place] [address] [place], [place] [address]    Urine Culture     Status: Abnormal   Collection Time: 07/04/19  6:32 PM   Specimen: Urine, Clean Catch  Result Value Ref Range Status   Specimen Description   Final    URINE, CLEAN CATCH Performed at [facility], [address], [place], [place] [address]    Special Requests   Final    NONE Performed at [facility], [place] [address] [place], [place] [address]    Culture (A)  Final    80,000 COLONIES/mL MULTIPLE SPECIES PRESENT, SUGGEST RECOLLECTION   Report Status 07/06/2019 FINAL  Final    Coagulation Studies: No results for input(s): LABPROT, INR in the last 72 hours.  Urinalysis: Recent Labs    07/04/19 1832  COLORURINE AMBER*  LABSPEC 1.016  PHURINE 5.0  GLUCOSEU NEGATIVE  HGBUR NEGATIVE  BILIRUBINUR NEGATIVE  KETONESUR NEGATIVE  PROTEINUR NEGATIVE  NITRITE NEGATIVE  LEUKOCYTESUR LARGE*      Imaging: US Paracentesis  Result Date: 07/07/2019 INDICATION: Ascites. EXAM: ULTRASOUND GUIDED  PARACENTESIS MEDICATIONS: None. COMPLICATIONS: None immediate. PROCEDURE: Informed written consent was obtained from the patient after a discussion of the risks, benefits and alternatives to treatment. A timeout was performed prior to the initiation of the procedure. Initial ultrasound scanning demonstrates a large amount of ascites within the right lower abdominal quadrant. The right lower abdomen was prepped and draped in the usual sterile fashion. 1% lidocaine was used for local anesthesia. Following this, a 6 French catheter was introduced. An ultrasound image was saved for documentation purposes. The paracentesis was performed. The catheter was removed and a dressing was applied. The patient tolerated the procedure well without immediate post procedural complication. Patient received post-procedure intravenous albumin; see nursing notes for details. FINDINGS: A total of approximately 4 L of serosanguineous fluid was removed. IMPRESSION: Successful ultrasound-guided  paracentesis yielding 4 liters of peritoneal fluid. Electronically Signed   By: Marcello Moores  Register   On: 07/07/2019 11:48     Medications:    . docusate sodium  200 mg Oral BID  . heparin  5,000 Units Subcutaneous Q8H  . levothyroxine  75 mcg Oral Daily  . pantoprazole  20 mg Oral Daily   acetaminophen **OR** acetaminophen, lactulose, ondansetron **OR** ondansetron (ZOFRAN) IV  Assessment/ Plan:  [name] is a 86 y.o. white female withsystolic congestive heart failure, atrial fibrillation, hypertension, hypothyroidism, hepatic cirrhosis, coronary artery diseaseadmitted on 07/04/2019 for Hyperkalemia [E87.5] Ventricular ectopy [I49.3] Acute renal failure superimposed on chronic kidney disease, unspecified CKD stage, unspecified acute renal failure type ([place]) [N17.9, N18.9]  1. Acute renal failure with hyperkalemia on chronic kidney disease stage IIIB: baseline creatinine of 1.4, GFR of 36 on  04/13/2019.  Chronic Kidney Disease most likely due to hypertensive nephrosclerosis Acute renal failure secondary to over diuresis.  Continue to hold diuretics  2. Hypotension: blood pressure is at goal. On midodrine at home. Holding currently  3. Cirrhosis with ascites: 4 liters.  Paracentesis today Goal is to be able to restart furosemide. Most likely will hold spironolactone due to hyperkalemia.   LOS: 2 [place] 4/9/20211:56 PM

## 2019-07-07 NOTE — Care Management Important Message (Signed)
Important Message  Patient Details  Name: Desiree Koch MRN: [id number] Date of Birth: 02/19/1931   Medicare Important Message Given:  Yes     Dannette Barbara 07/07/2019, 10:38 AM

## 2019-07-08 ENCOUNTER — Inpatient Hospital Stay: Payer: Medicare HMO

## 2019-07-08 DIAGNOSIS — E875 Hyperkalemia: Secondary | ICD-10-CM | POA: Diagnosis not present

## 2019-07-08 LAB — CBC
HCT: 25.6 % — ABNORMAL LOW (ref 36.0–46.0)
Hemoglobin: 8.9 g/dL — ABNORMAL LOW (ref 12.0–15.0)
MCH: 35.9 pg — ABNORMAL HIGH (ref 26.0–34.0)
MCHC: 34.8 g/dL (ref 30.0–36.0)
MCV: 103.2 fL — ABNORMAL HIGH (ref 80.0–100.0)
Platelets: 142 10*3/uL — ABNORMAL LOW (ref 150–400)
RBC: 2.48 MIL/uL — ABNORMAL LOW (ref 3.87–5.11)
RDW: 16.5 % — ABNORMAL HIGH (ref 11.5–15.5)
WBC: 6.2 10*3/uL (ref 4.0–10.5)
nRBC: 0 % (ref 0.0–0.2)

## 2019-07-08 LAB — COMPREHENSIVE METABOLIC PANEL
ALT: 11 U/L (ref 0–44)
AST: 34 U/L (ref 15–41)
Albumin: 3.4 g/dL — ABNORMAL LOW (ref 3.5–5.0)
Alkaline Phosphatase: 57 U/L (ref 38–126)
Anion gap: 8 (ref 5–15)
BUN: 41 mg/dL — ABNORMAL HIGH (ref 8–23)
CO2: 22 mmol/L (ref 22–32)
Calcium: 8.8 mg/dL — ABNORMAL LOW (ref 8.9–10.3)
Chloride: 99 mmol/L (ref 98–111)
Creatinine, Ser: 3.02 mg/dL — ABNORMAL HIGH (ref 0.44–1.00)
GFR calc Af Amer: 16 mL/min — ABNORMAL LOW (ref >60)
GFR calc non Af Amer: 13 mL/min — ABNORMAL LOW (ref >60)
Glucose, Bld: 79 mg/dL (ref 70–99)
Potassium: 5.3 mmol/L — ABNORMAL HIGH (ref 3.5–5.1)
Sodium: 129 mmol/L — ABNORMAL LOW (ref 135–145)
Total Bilirubin: 3.1 mg/dL — ABNORMAL HIGH (ref 0.3–1.2)
Total Protein: 5.6 g/dL — ABNORMAL LOW (ref 6.5–8.1)

## 2019-07-08 MED ORDER — MIDODRINE HCL 5 MG PO TABS
10.0000 mg | ORAL_TABLET | Freq: Three times a day (TID) | ORAL | Status: DC
  Administered 2019-07-08 – 2019-07-13 (×16): 10 mg via ORAL
  Filled 2019-07-08 (×19): qty 2

## 2019-07-08 MED ORDER — PROMETHAZINE HCL 25 MG/ML IJ SOLN
12.5000 mg | Freq: Once | INTRAMUSCULAR | Status: AC
  Administered 2019-07-08: 12.5 mg via INTRAVENOUS
  Filled 2019-07-08: qty 1

## 2019-07-08 MED ORDER — METOPROLOL SUCCINATE ER 50 MG PO TB24
50.0000 mg | ORAL_TABLET | Freq: Every day | ORAL | Status: DC
  Administered 2019-07-08 – 2019-07-13 (×4): 50 mg via ORAL
  Filled 2019-07-08 (×5): qty 1

## 2019-07-08 MED ORDER — CALCIUM GLUCONATE-NACL 1-0.675 GM/50ML-% IV SOLN
1.0000 g | Freq: Once | INTRAVENOUS | Status: AC
  Administered 2019-07-08: 1000 mg via INTRAVENOUS
  Filled 2019-07-08: qty 50

## 2019-07-08 MED ORDER — SODIUM ZIRCONIUM CYCLOSILICATE 10 G PO PACK
10.0000 g | PACK | Freq: Once | ORAL | Status: AC
  Administered 2019-07-08: 10 g via ORAL
  Filled 2019-07-08: qty 1

## 2019-07-08 MED ORDER — SODIUM BICARBONATE 650 MG PO TABS
650.0000 mg | ORAL_TABLET | Freq: Two times a day (BID) | ORAL | Status: DC
  Administered 2019-07-08 – 2019-07-13 (×11): 650 mg via ORAL
  Filled 2019-07-08 (×11): qty 1

## 2019-07-08 MED ORDER — FUROSEMIDE 10 MG/ML IJ SOLN
4.0000 mg/h | INTRAVENOUS | Status: DC
  Administered 2019-07-08 – 2019-07-11 (×2): 4 mg/h via INTRAVENOUS
  Filled 2019-07-08 (×2): qty 25

## 2019-07-08 MED ORDER — SODIUM BICARBONATE 8.4 % IV SOLN
50.0000 meq | Freq: Once | INTRAVENOUS | Status: AC
  Administered 2019-07-08: 50 meq via INTRAVENOUS
  Filled 2019-07-08: qty 50

## 2019-07-08 NOTE — Progress Notes (Signed)
[facility]  ROUNDING NOTE   Subjective:   Patient is doing fair Appetite remains poor Abdomen is still distended  Objective:  Vital signs in last 24 hours:  Temp:  [97.9 F (36.6 C)-98 F (36.7 C)] 97.9 F (36.6 C) (04/10 1233) Pulse Rate:  [42-126] 42 (04/10 1233) Resp:  [20] 20 (04/10 0301) BP: (109-124)/(41-53) 109/41 (04/10 1233) SpO2:  [99 %-100 %] 99 % (04/10 1233)  Weight change:  Filed Weights   07/04/19 2156 07/05/19 0500 07/07/19 0420  Weight: 67 kg 70.1 kg 67.8 kg    Intake/Output: I/O last 3 completed shifts: In: 480 [P.O.:480] Out: 300 [Urine:300]   Intake/Output this shift:  Total I/O In: 600 [P.O.:600] Out: 400 [Urine:400]  Physical Exam: General: NAD,   Head: Normocephalic, atraumatic. Moist oral mucosal membranes  Eyes: Anicteric,  Neck: Supple,  Lungs:  Clear to auscultation  Heart: Regular rate and rhythm  Abdomen:  +ascites  Extremities: + peripheral edema.  Neurologic: Nonfocal, alert, able to answer questions appropriately  Skin: No lesions        Basic Metabolic Panel: Recent Labs  Lab 07/04/19 1736 07/04/19 2145 07/05/19 0500 07/05/19 0500 07/05/19 1746 07/06/19 0548 07/07/19 0446 07/08/19 0541  NA 129*  --  131*  --   --  131* 129* 129*  K 6.3*   < > 5.2*  --  5.5* 5.0 4.9 5.3*  CL 98  --  99  --   --  100 99 99  CO2 23  --  22  --   --  22 20* 22  GLUCOSE 104*  --  89  --   --  92 99 79  BUN 35*  --  36*  --   --  41* 43* 41*  CREATININE 2.61*  --  2.60*  --   --  2.81* 2.90* 3.02*  CALCIUM 9.8  --  9.4   < >  --  9.1 9.0 8.8*  MG 2.2  --  2.1  --   --   --   --   --    < > = values in this interval not displayed.    Liver Function Tests: Recent Labs  Lab 07/04/19 1736 07/06/19 0548 07/07/19 0446 07/08/19 0541  AST 50* 43* 43* 34  ALT 15 13 13 11   ALKPHOS 87 80 83 57  BILITOT 3.1* 2.8* 2.9* 3.1*  PROT 6.4* 5.6* 5.8* 5.6*  ALBUMIN 3.2* 2.7* 2.8* 3.4*   Recent Labs  Lab 07/04/19 1736   LIPASE 75*   No results for input(s): AMMONIA in the last 168 hours.  CBC: Recent Labs  Lab 07/04/19 1612 07/05/19 0500 07/06/19 0548 07/07/19 0446 07/08/19 0541  WBC 8.7 8.6 7.9 8.2 6.2  HGB 11.8* 10.3* 9.8* 10.6* 8.9*  HCT 36.4 30.0* 28.8* 30.5* 25.6*  MCV 109.0* 104.5* 106.7* 102.3* 103.2*  PLT 214 212 199 206 142*    Cardiac Enzymes: No results for input(s): CKTOTAL, CKMB, CKMBINDEX, TROPONINI in the last 168 hours.  BNP: Invalid input(s): POCBNP  CBG: Recent Labs  Lab 07/04/19 1842  GLUCAP 60    Microbiology: Results for orders placed or performed during the hospital encounter of 07/04/19  SARS CORONAVIRUS 2 (TAT 6-24 HRS) Nasopharyngeal Nasopharyngeal Swab     Status: None   Collection Time: 07/04/19  4:43 PM   Specimen: Nasopharyngeal Swab  Result Value Ref Range Status   SARS Coronavirus 2 NEGATIVE NEGATIVE Final    Comment: (NOTE) SARS-CoV-2 target nucleic  acids are NOT DETECTED. The SARS-CoV-2 RNA is generally detectable in upper and lower respiratory specimens during the acute phase of infection. Negative results do not preclude SARS-CoV-2 infection, do not rule out co-infections with other pathogens, and should not be used as the sole basis for treatment or other patient management decisions. Negative results must be combined with clinical observations, patient history, and epidemiological information. The expected result is Negative. Fact Sheet for Patients: SugarRoll.be Fact Sheet for Healthcare Providers: https://www.woods-mathews.com/ This test is not yet approved or cleared by the [place] FDA and  has been authorized for detection and/or diagnosis of SARS-CoV-2 by FDA under an Emergency Use Authorization (EUA). This EUA will remain  in effect (meaning this test can be used) for the duration of the COVID-19 declaration under Section 56 4(b)(1) of the Act, 21 U.S.C. section 360bbb-3(b)(1), unless  the authorization is terminated or revoked sooner. Performed at [facility], [place] [address] [place], [place] [address]   Urine Culture     Status: Abnormal   Collection Time: 07/04/19  6:32 PM   Specimen: Urine, Clean Catch  Result Value Ref Range Status   Specimen Description   Final    URINE, CLEAN CATCH Performed at [facility], [address] [place], [place] [address]    Special Requests   Final    NONE Performed at [facility], [place] [address] [place], [place] [address]    Culture (A)  Final    80,000 COLONIES/mL MULTIPLE SPECIES PRESENT, SUGGEST RECOLLECTION   Report Status 07/06/2019 FINAL  Final    Coagulation Studies: No results for input(s): LABPROT, INR in the last 72 hours.  Urinalysis: No results for input(s): COLORURINE, LABSPEC, PHURINE, GLUCOSEU, HGBUR, BILIRUBINUR, KETONESUR, PROTEINUR, UROBILINOGEN, NITRITE, LEUKOCYTESUR in the last 72 hours.  Invalid input(s): APPERANCEUR    Imaging: US Paracentesis  Result Date: 07/07/2019 INDICATION: Ascites. EXAM: ULTRASOUND GUIDED  PARACENTESIS MEDICATIONS: None. COMPLICATIONS: None immediate. PROCEDURE: Informed written consent was obtained from the patient after a discussion of the risks, benefits and alternatives to treatment. A timeout was performed prior to the initiation of the procedure. Initial ultrasound scanning demonstrates a large amount of ascites within the right lower abdominal quadrant. The right lower abdomen was prepped and draped in the usual sterile fashion. 1% lidocaine was used for local anesthesia. Following this, a 6 French catheter was introduced. An ultrasound image was saved for documentation purposes. The paracentesis was performed. The catheter was removed and a dressing was applied. The patient tolerated the procedure well without immediate post procedural complication. Patient received post-procedure intravenous albumin; see nursing notes for details. FINDINGS: A total  of approximately 4 L of serosanguineous fluid was removed. IMPRESSION: Successful ultrasound-guided paracentesis yielding 4 liters of peritoneal fluid. Electronically Signed   By: Marcello Moores  Register   On: 07/07/2019 11:48     Medications:   . furosemide (LASIX) infusion     . docusate sodium  200 mg Oral BID  . heparin  5,000 Units Subcutaneous Q8H  . levothyroxine  75 mcg Oral Daily  . metoprolol succinate  50 mg Oral Daily  . midodrine  10 mg Oral TID WC  . pantoprazole  20 mg Oral Daily   acetaminophen **OR** acetaminophen, lactulose, ondansetron **OR** ondansetron (ZOFRAN) IV  Assessment/ Plan:  [name] is a 86 y.o. white female withsystolic congestive heart failure, atrial fibrillation, hypertension, hypothyroidism, hepatic cirrhosis, coronary artery diseaseadmitted on 07/04/2019 for Hyperkalemia [E87.5] Ventricular ectopy [I49.3] Acute  renal failure superimposed on chronic kidney disease, unspecified CKD stage, unspecified acute renal failure type ([place]) [N17.9, N18.9]  June 16, 2019: 2D echo: LVEF 20 to 25%, regional wall motion abnormalities, left ventricular internal cavity moderately dilated, mildly elevated pulmonary artery systolic pressure, dilated left and right atrium  1. Acute renal failure with hyperkalemia on chronic kidney disease stage IIIB:  baseline creatinine of 1.4, GFR of 36 on 04/13/2019.  Chronic Kidney Disease most likely due to hypertensive nephrosclerosis Worsening of renal failure is likely secondary to abnormal cardiorenal hemodynamics Unfortunately patient serum creatinine is worse today at 3.02 Potassium level elevated at 5.3.  Shifting measures instituted by hospitalist team We will add midodrine to her regimen to maintain higher blood pressures.  Trial of IV furosemide to control edema and for relief of ascites  2. Cirrhosis with ascites: 4 liters.  Removed with paracentesis on April 9 Furosemide restarted as low-dose IV infusion for  sustained effect Continue to hold spironolactone due to hyperkalemia.  Case discussed by phone with patient's daughterJodie Koch [phone]   LOS: [place] 4/10/20212:31 PM

## 2019-07-08 NOTE — Progress Notes (Signed)
Dressing from paracentesis on abdomen changed twice on this shift due to being saturated with clear like, yellowish fluid. Abdominal distention noted to be increased from this am.and patient verbalizes feeling her abd tight./ denies pain  MD Williams secure texted to make aware.

## 2019-07-08 NOTE — Progress Notes (Signed)
Per telemetry, Pt had 4 run of Vtach. MD Jimmye Norman secured texted to made aware

## 2019-07-08 NOTE — Progress Notes (Signed)
PROGRESS NOTE    Desiree Koch  [id number] DOB: October 25, 1930 DOA: 07/04/2019 PCP: Adin Hector, MD      Assessment & Plan:   Principal Problem:   Hyperkalemia Active Problems:   Acute kidney injury superimposed on chronic kidney disease (HCC)   AF (paroxysmal atrial fibrillation) (HCC)   Hypothyroidism   CAD (coronary artery disease)   Chronic systolic CHF (congestive heart failure) (HCC)   Other cirrhosis of liver (HCC)   Essential hypertension   Hyperkalemia: in setting of spironolactone and potassium supplementation use & AKI.  EKG has new frequent PVCs & nonsustained runs of VT. Calcium gluconate, bicarb & lokelma ordered.  Continue to hold spironolactone. Continue on tele. Nephro following and recs apprec  AKI on CKD stage III: Cr continues to trend up. Likely secondary to medication effect in setting of spironolactone and increased lasix dosing. Continue to hold lasix and spironolactone. Monitor I/Os. Nephro following and recs apprec  Paroxysmal atrial fibrillation/flutter: rate controlled and frequent PVCs.  Not on anticoagulation due to prior history of bleeding. Restart metoprolol but a reduced dose secondary to low end of normal BP. Recently started on digoxin   Chronic systolic CHF: EF 0000000 by TTE 06/16/2019. Appears overall intravascularly volume depleted. Will continue on hold lasix   Cirrhosis: had recent paracentesis on 06/27/2019 with removal of 4 L peritoneal fluid. S/p paracentesis 07/07/19 w/ 4L and albumin given after. Holding lasix and spironolactone as above.   Thrombocytopenia: likely secondary to cirrhosis. Will continue to monitor   Transaminitis: resolved  Hyponatremia: likely secondary to cirrhosis. Will continue to monitor   Macrocytic anemia: likely secondary to cirrhosis. No need for a transfusion at this time   Hypertension: continue to hold metoprolol ,lasix due to low normal blood pressure.   CAD: chronic and stable.  Denies any  chest pain.   Hypothyroidism: continue synthroid.   DVT prophylaxis: heparin SQ Code Status: full  Family Communication: discussed pt's care with pt's daughter, [name], and answered her questions Disposition Plan: will likely d/c w/ home health. Home health orders will be placed. Unlikely no barriers to d/c   Consultants:   nepho   Procedures:    Antimicrobials:    Subjective: Pt c/o malaise and frustration with everything that is going on with her.   Objective: Vitals:   07/07/19 1119 07/07/19 1419 07/07/19 2028 07/08/19 0301  BP: (!) 131/58 111/67 (!) 116/53 (!) 124/46  Pulse:  63 61 (!) 59  Resp:  18 20 20   Temp:  97.7 F (36.5 C) 98 F (36.7 C) 97.9 F (36.6 C)  TempSrc:  Oral Oral Oral  SpO2: 96% 100% 100% 99%  Weight:      Height:        Intake/Output Summary (Last 24 hours) at 07/08/2019 0734 Last data filed at 07/08/2019 0301 Gross per 24 hour  Intake 240 ml  Output 300 ml  Net -60 ml   Filed Weights   07/04/19 2156 07/05/19 0500 07/07/19 0420  Weight: 67 kg 70.1 kg 67.8 kg    Examination:  General exam: Appears calm and comfortable  Respiratory system: Clear to auscultation. No wheezes, rales  Cardiovascular system: irregularly irregular. No rubs, gallops or clicks.  Gastrointestinal system: Abdomen is distended, soft and nontender. Normal bowel sounds heard. Central nervous system: Alert and oriented. Moves all 4 extremities Psychiatry: Judgement and insight appear abnormal. Flat mood and affect    Data Reviewed: I have personally reviewed following labs and imaging studies  CBC: Recent Labs  Lab 07/04/19 1612 07/05/19 0500 07/06/19 0548 07/07/19 0446 07/08/19 0541  WBC 8.7 8.6 7.9 8.2 6.2  HGB 11.8* 10.3* 9.8* 10.6* 8.9*  HCT 36.4 30.0* 28.8* 30.5* 25.6*  MCV 109.0* 104.5* 106.7* 102.3* 103.2*  PLT 214 212 199 206 [id number]*   Basic Metabolic Panel: Recent Labs  Lab 07/04/19 1736 07/04/19 2145 07/05/19 0500 07/05/19 1746 07/06/19  0548 07/07/19 0446 07/08/19 0541  NA 129*  --  131*  --  131* 129* 129*  K 6.3*   < > 5.2* 5.5* 5.0 4.9 5.3*  CL 98  --  99  --  100 99 99  CO2 23  --  22  --  22 20* 22  GLUCOSE 104*  --  89  --  92 99 79  BUN 35*  --  36*  --  41* 43* 41*  CREATININE 2.61*  --  2.60*  --  2.81* 2.90* 3.02*  CALCIUM 9.8  --  9.4  --  9.1 9.0 8.8*  MG 2.2  --  2.1  --   --   --   --    < > = values in this interval not displayed.   GFR: Estimated Creatinine Clearance: 11.8 mL/min (A) (by C-G formula based on SCr of 3.02 mg/dL (H)). Liver Function Tests: Recent Labs  Lab 07/04/19 1736 07/06/19 0548 07/07/19 0446 07/08/19 0541  AST 50* 43* 43* 34  ALT 15 13 13 11   ALKPHOS 87 80 83 57  BILITOT 3.1* 2.8* 2.9* 3.1*  PROT 6.4* 5.6* 5.8* 5.6*  ALBUMIN 3.2* 2.7* 2.8* 3.4*   Recent Labs  Lab 07/04/19 1736  LIPASE 75*   No results for input(s): AMMONIA in the last 168 hours. Coagulation Profile: No results for input(s): INR, PROTIME in the last 168 hours. Cardiac Enzymes: No results for input(s): CKTOTAL, CKMB, CKMBINDEX, TROPONINI in the last 168 hours. BNP (last 3 results) No results for input(s): PROBNP in the last 8760 hours. HbA1C: No results for input(s): HGBA1C in the last 72 hours. CBG: Recent Labs  Lab 07/04/19 1842  GLUCAP 81   Lipid Profile: No results for input(s): CHOL, HDL, LDLCALC, TRIG, CHOLHDL, LDLDIRECT in the last 72 hours. Thyroid Function Tests: No results for input(s): TSH, T4TOTAL, FREET4, T3FREE, THYROIDAB in the last 72 hours. Anemia Panel: No results for input(s): VITAMINB12, FOLATE, FERRITIN, TIBC, IRON, RETICCTPCT in the last 72 hours. Sepsis Labs: No results for input(s): PROCALCITON, LATICACIDVEN in the last 168 hours.  Recent Results (from the past 240 hour(s))  SARS CORONAVIRUS 2 (TAT 6-24 HRS) Nasopharyngeal Nasopharyngeal Swab     Status: None   Collection Time: 07/04/19  4:43 PM   Specimen: Nasopharyngeal Swab  Result Value Ref Range Status    SARS Coronavirus 2 NEGATIVE NEGATIVE Final    Comment: (NOTE) SARS-CoV-2 target nucleic acids are NOT DETECTED. The SARS-CoV-2 RNA is generally detectable in upper and lower respiratory specimens during the acute phase of infection. Negative results do not preclude SARS-CoV-2 infection, do not rule out co-infections with other pathogens, and should not be used as the sole basis for treatment or other patient management decisions. Negative results must be combined with clinical observations, patient history, and epidemiological information. The expected result is Negative. Fact Sheet for Patients: SugarRoll.be Fact Sheet for Healthcare Providers: https://www.woods-mathews.com/ This test is not yet approved or cleared by the [place] FDA and  has been authorized for detection and/or diagnosis of SARS-CoV-2 by FDA under an Emergency  Use Authorization (EUA). This EUA will remain  in effect (meaning this test can be used) for the duration of the COVID-19 declaration under Section 56 4(b)(1) of the Act, 21 U.S.C. section 360bbb-3(b)(1), unless the authorization is terminated or revoked sooner. Performed at [facility], [place] [address], [place], [place] [address]   Urine Culture     Status: Abnormal   Collection Time: 07/04/19  6:32 PM   Specimen: Urine, Clean Catch  Result Value Ref Range Status   Specimen Description   Final    URINE, CLEAN CATCH Performed at [facility], [address], [place], [place] [address]    Special Requests   Final    NONE Performed at [facility], [place] [address], [place], [place] [address]    Culture (A)  Final    80,000 COLONIES/mL MULTIPLE SPECIES PRESENT, SUGGEST RECOLLECTION   Report Status 07/06/2019 FINAL  Final         Radiology Studies: US Paracentesis  Result Date: 07/07/2019 INDICATION: Ascites. EXAM: ULTRASOUND GUIDED  PARACENTESIS MEDICATIONS: None. COMPLICATIONS:  None immediate. PROCEDURE: Informed written consent was obtained from the patient after a discussion of the risks, benefits and alternatives to treatment. A timeout was performed prior to the initiation of the procedure. Initial ultrasound scanning demonstrates a large amount of ascites within the right lower abdominal quadrant. The right lower abdomen was prepped and draped in the usual sterile fashion. 1% lidocaine was used for local anesthesia. Following this, a 6 French catheter was introduced. An ultrasound image was saved for documentation purposes. The paracentesis was performed. The catheter was removed and a dressing was applied. The patient tolerated the procedure well without immediate post procedural complication. Patient received post-procedure intravenous albumin; see nursing notes for details. FINDINGS: A total of approximately 4 L of serosanguineous fluid was removed. IMPRESSION: Successful ultrasound-guided paracentesis yielding 4 liters of peritoneal fluid. Electronically Signed   By: [place]   On: 07/07/2019 11:48        Scheduled Meds: . docusate sodium  200 mg Oral BID  . heparin  5,000 Units Subcutaneous Q8H  . levothyroxine  75 mcg Oral Daily  . pantoprazole  20 mg Oral Daily   Continuous Infusions:    LOS: 3 days    Time spent: 36 mins    Wyvonnia Dusky, MD [facility] Pager [phone]  If 7PM-7AM, please contact night-coverage [email or web address] 07/08/2019, 7:34 AM

## 2019-07-09 DIAGNOSIS — K7469 Other cirrhosis of liver: Secondary | ICD-10-CM

## 2019-07-09 DIAGNOSIS — N179 Acute kidney failure, unspecified: Secondary | ICD-10-CM | POA: Diagnosis not present

## 2019-07-09 DIAGNOSIS — I5022 Chronic systolic (congestive) heart failure: Secondary | ICD-10-CM

## 2019-07-09 DIAGNOSIS — N189 Chronic kidney disease, unspecified: Secondary | ICD-10-CM | POA: Diagnosis not present

## 2019-07-09 LAB — CBC
HCT: 24.2 % — ABNORMAL LOW (ref 36.0–46.0)
Hemoglobin: 8.2 g/dL — ABNORMAL LOW (ref 12.0–15.0)
MCH: 36.1 pg — ABNORMAL HIGH (ref 26.0–34.0)
MCHC: 33.9 g/dL (ref 30.0–36.0)
MCV: 106.6 fL — ABNORMAL HIGH (ref 80.0–100.0)
Platelets: 143 10*3/uL — ABNORMAL LOW (ref 150–400)
RBC: 2.27 MIL/uL — ABNORMAL LOW (ref 3.87–5.11)
RDW: 16.4 % — ABNORMAL HIGH (ref 11.5–15.5)
WBC: 7.2 10*3/uL (ref 4.0–10.5)
nRBC: 0 % (ref 0.0–0.2)

## 2019-07-09 LAB — COMPREHENSIVE METABOLIC PANEL
ALT: 11 U/L (ref 0–44)
AST: 29 U/L (ref 15–41)
Albumin: 3.4 g/dL — ABNORMAL LOW (ref 3.5–5.0)
Alkaline Phosphatase: 54 U/L (ref 38–126)
Anion gap: 8 (ref 5–15)
BUN: 44 mg/dL — ABNORMAL HIGH (ref 8–23)
CO2: 26 mmol/L (ref 22–32)
Calcium: 9 mg/dL (ref 8.9–10.3)
Chloride: 99 mmol/L (ref 98–111)
Creatinine, Ser: 3.3 mg/dL — ABNORMAL HIGH (ref 0.44–1.00)
GFR calc Af Amer: 14 mL/min — ABNORMAL LOW (ref >60)
GFR calc non Af Amer: 12 mL/min — ABNORMAL LOW (ref >60)
Glucose, Bld: 84 mg/dL (ref 70–99)
Potassium: 4.8 mmol/L (ref 3.5–5.1)
Sodium: 133 mmol/L — ABNORMAL LOW (ref 135–145)
Total Bilirubin: 2.6 mg/dL — ABNORMAL HIGH (ref 0.3–1.2)
Total Protein: 5.2 g/dL — ABNORMAL LOW (ref 6.5–8.1)

## 2019-07-09 MED ORDER — ALBUMIN HUMAN 25 % IV SOLN
25.0000 g | Freq: Once | INTRAVENOUS | Status: AC
  Administered 2019-07-09: 25 g via INTRAVENOUS
  Filled 2019-07-09: qty 100

## 2019-07-09 MED ORDER — LACTULOSE 10 GM/15ML PO SOLN
30.0000 g | Freq: Two times a day (BID) | ORAL | Status: DC
  Administered 2019-07-09 – 2019-07-13 (×8): 30 g via ORAL
  Filled 2019-07-09 (×9): qty 60

## 2019-07-09 NOTE — Consult Note (Addendum)
Lucilla Lame, MD [facility]  [address], [place], [place] [address] Phone: [phone] Fax : [phone]  Consultation  Referring Provider:     Dr. Gwyndolyn Saxon Primary Care Physician:  Adin Hector, MD Primary Gastroenterologist:  Dr. Alice Reichert         Reason for Consultation:     Suspected hepatorenal syndrome  Date of Admission:  07/04/2019 Date of Consultation:  07/09/2019         HPI:   Desiree Koch is a 86 y.o. 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  The patient has no other risk factors and reports that she has never been a alcohol drinker.  The patient has a history of chronic kidney disease chronic systolic congestive heart failure with an ejection fraction of 20 to 25% left bundle branch block hypertension hypothyroidism and A. fib.  The patient presented to the emergency room with weakness and has had a deteriorating creatinine.  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  It was reported that it was less likely to be hepatorenal syndrome by nephrology.  The patient also has an underlying cardiac arrhythmia with hypotension making ATN more likely. The patient's daughter states that the patient has not followed up with her gastroenterologist because they recently had the patient's other daughter die of a brain tumor.  The patient had a paracentesis of 4 L of peritoneal fluid taken off.  The patient's friend sodium and urine osmolarity were both normal.  Past Medical History:  Diagnosis Date  . Arthritis   . Cancer ([place]) 2018   skin ca  . Cardiac arrhythmia   . Coronary artery disease   . CRD (chronic renal disease)   . Dysrhythmia   . Heart murmur   . HOH (hard of hearing)   . Hypercholesteremia   . Hypertension   . Hypothyroidism   . Leaky heart valve   . Liver damage   . Lower extremity edema   . Multiple allergies   . Myocardial  infarction ([place])   . Osteoporosis   . Pneumonia     Past Surgical History:  Procedure Laterality Date  . ABDOMINAL HYSTERECTOMY    . CATARACT EXTRACTION W/PHACO Left 09/12/2015   Procedure: CATARACT EXTRACTION PHACO AND INTRAOCULAR LENS PLACEMENT (IOC);  Surgeon: Eulogio Bear, MD;  Location: [facility] ORS;  Service: Ophthalmology;  Laterality: Left;  Lot# [id number] H [place]: 01:02.0 AP%:15.3 CDE: 9.45  . CATARACT EXTRACTION W/PHACO Right 10/31/2015   Procedure: CATARACT EXTRACTION PHACO AND INTRAOCULAR LENS PLACEMENT (IOC);  Surgeon: Eulogio Bear, MD;  Location: [facility] ORS;  Service: Ophthalmology;  Laterality: Right;  [place] 01:16.2 AP% 14.8 CDE 11.27 Fluid Pack Lot # [id number]  . CESAREAN SECTION    . CHOLECYSTECTOMY    . KNEE ARTHROSCOPY    . KYPHOPLASTY N/A 03/17/2019   Procedure: KYPHOPLASTY L2;  Surgeon: Hessie Knows, MD;  Location: [facility] ORS;  Service: Orthopedics;  Laterality: N/A;  . THYROIDECTOMY      Prior to Admission medications   Medication Sig Start Date End Date Taking? Authorizing Provider  acetaminophen (TYLENOL) 325 MG tablet Take 325-650 mg by mouth every 6 (six) hours as needed for mild pain or moderate pain.    Yes [provider]  cholecalciferol (VITAMIN D3) 25 MCG (1000 UT) tablet Take 2,000 Units by mouth daily.    Yes [provider]  digoxin 62.5 MCG TABS Take 0.0625 mg by mouth daily. 06/21/19 09/19/19 Yes Enzo Bi, MD  fexofenadine (ALLEGRA) 180 MG tablet Take 180 mg by mouth at bedtime.    Yes [provider]  fluticasone (FLONASE) 50 MCG/ACT nasal spray Place 2 sprays into both nostrils daily as needed for rhinitis.    Yes [provider]  furosemide (LASIX) 40 MG tablet Take 1 tablet (40 mg total) by mouth daily. Patient taking differently: Take 80 mg by mouth daily.  06/21/19 09/19/19 Yes Enzo Bi, MD  Glucosamine 500 MG CAPS Take 500 mg by mouth daily.    Yes [provider]  levothyroxine (SYNTHROID, LEVOTHROID) 75  MCG tablet Take 75 mcg by mouth daily.   Yes [provider]  metoprolol succinate (TOPROL-XL) 100 MG 24 hr tablet Take 1 tablet (100 mg total) by mouth daily. 06/20/19 09/18/19 Yes Enzo Bi, MD  Multiple Vitamins-Minerals (PRESERVISION AREDS 2+MULTI VIT PO) Take 2 tablets by mouth daily.   Yes [provider]  potassium chloride SA (KLOR-CON) 20 MEQ tablet Take 20 mEq by mouth daily. 06/14/19  Yes [provider]  spironolactone (ALDACTONE) 25 MG tablet Take 0.5 tablets (12.5 mg total) by mouth daily. 06/21/19 09/19/19 Yes Enzo Bi, MD    Family History  Problem Relation Age of Onset  . Heart disease Father   . Stroke Sister   . Cancer Brother   . Breast cancer Neg Hx      Social History   Tobacco Use  . Smoking status: Never Smoker  . Smokeless tobacco: Never Used  Substance Use Topics  . Alcohol use: No  . Drug use: No    Allergies as of 07/04/2019 - Review Complete 07/04/2019  Allergen Reaction Noted  . Amlodipine Swelling 09/09/2015  . Clonidine derivatives Swelling 09/09/2015  . Norvasc [amlodipine besylate] Swelling 09/09/2015  . Aspirin Other (See Comments) 09/09/2015  . Celebrex [celecoxib] Other (See Comments) 09/09/2015  . Codeine Swelling 10/28/2015  . Propoxyphene Swelling 10/28/2015  . Latex Other (See Comments) 09/09/2015  . Penicillins Other (See Comments) 09/09/2015  . Ace inhibitors Cough 10/28/2015  . Ciprofloxacin Palpitations 09/09/2015  . Flexeril [cyclobenzaprine] Other (See Comments) 10/28/2015  . Hctz [hydrochlorothiazide] Other (See Comments) 10/28/2015  . Lisinopril Other (See Comments) 09/09/2015  . Meperidine and related Other (See Comments) 10/28/2015  . Pravastatin Rash 10/28/2015  . Sulfa antibiotics Rash 09/09/2015    Review of Systems:    All systems reviewed and negative except where noted in HPI.   Physical Exam:  Vital signs in last 24 hours: Temp:  [97.4 F (36.3 C)-98.4 F (36.9 C)] 97.4 F (36.3 C)  (04/11 1147) Pulse Rate:  [40-94] 94 (04/11 1147) Resp:  [16-18] 18 (04/11 0430) BP: (96-128)/(44-62) 128/45 (04/11 1147) SpO2:  [97 %-99 %] 99 % (04/11 1147) Weight:  [66 kg] 66 kg (04/11 0500) Last BM Date: 07/07/19 General:   Pleasant, cooperative in NAD Head:  Normocephalic and atraumatic. Eyes:   No icterus.   Conjunctiva pink. PERRLA. Ears:  Normal auditory acuity. Neck:  Supple; no masses or thyroidomegaly Lungs: Respirations even and unlabored. Lungs clear to auscultation bilaterally.   No wheezes, crackles, or rhonchi.  Heart: Irregular rate and rhythm;  Without murmur, clicks, rubs or gallops Abdomen:  Soft, nondistended, positive distended with shifting dullness and fluid wave. Normal bowel sounds. No appreciable masses or hepatomegaly.  No rebound or guarding.  Rectal:  Not performed. Msk:  Symmetrical without gross deformities.    Extremities:  Without edema, cyanosis or clubbing. Neurologic:  Alert and oriented x3;  grossly normal neurologically. Skin:  Intact without significant lesions or rashes. Cervical Nodes:  No significant cervical adenopathy. Psych:  Alert and cooperative. Normal affect.  LAB RESULTS: Recent Labs    07/07/19 0446 07/08/19 0541 07/09/19 0721  WBC 8.2 6.2 7.2  HGB 10.6* 8.9* 8.2*  HCT 30.5* 25.6* 24.2*  PLT 206 142* 143*   BMET Recent Labs    07/07/19 0446 07/08/19 0541 07/09/19 0721  NA 129* 129* 133*  K 4.9 5.3* 4.8  CL 99 99 99  CO2 20* 22 26  GLUCOSE 99 79 84  BUN 43* 41* 44*  CREATININE 2.90* 3.02* 3.30*  CALCIUM 9.0 8.8* 9.0   LFT Recent Labs    07/09/19 0721  PROT 5.2*  ALBUMIN 3.4*  AST 29  ALT 11  ALKPHOS 54  BILITOT 2.6*   PT/INR No results for input(s): LABPROT, INR in the last 72 hours.  STUDIES: DG Abd 1 View  Result Date: 07/08/2019 CLINICAL DATA:  Abdominal distension. EXAM: ABDOMEN - 1 VIEW COMPARISON:  None. FINDINGS: The bowel gas pattern is normal. Moderate amount of stool is seen throughout the  large bowel. No radio-opaque calculi or other significant radiographic abnormality are seen. There is evidence of prior vertebroplasty at the level of L2 vertebral body. Radiopaque surgical clips are seen overlying the right upper quadrant. IMPRESSION: Moderate stool burden without evidence of bowel obstruction. Electronically Signed   By: Virgina Norfolk M.D.   On: 07/08/2019 22:59      Impression / Plan:   Assessment: Principal Problem:   Hyperkalemia Active Problems:   Acute kidney injury superimposed on chronic kidney disease (HCC)   AF (paroxysmal atrial fibrillation) (HCC)   Hypothyroidism   CAD (coronary artery disease)   Chronic systolic CHF (congestive heart failure) (HCC)   Other cirrhosis of liver (HCC)   Essential hypertension   Desiree Koch is a 86 y.o. 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  The patient has had refractory ascites with worsening creatinine and is being followed by nephrology.  The patient's meld score is at least 26 but there is no recent INR since the end of last month to calculate the meld score.  The patient has multiple other reasons for worsening renal function and I agree with nephrology that hepatorenal syndrome is less likely with this patient.     Plan:  As per Up-To-Date online the criteria for hepatorenal syndrome include "The absence of any other apparent cause for the acute kidney injury, including shock, current or recent treatment with nephrotoxic drugs, and the absence of ultrasonographic evidence of obstruction or parenchymal kidney disease." and "The diagnosis of the hepatorenal syndrome is one of exclusion, entertained only after other potential causes of acute or subacute kidney injury have been ruled out".   I do not believe that this patient has had other reasons ruled out and I would continue to treat the patient for other possible causes since hepatorenal syndrome treatment is  limited to supportive and liver transplant.  Although a TIPS procedure may help the patient manage the ascites and require less paracentesis thereby decreasing the fluid shifts that may be impacting her kidneys, interventional radiology has been hesitant to do TIPS procedures on people with elevated meld scores and this patient's score is now 26.  Thank you for involving me in the care of this patient.  LOS: 4 days   Lucilla Lame, MD  07/09/2019, 3:31 PM Pager [phone] 7am-5pm  Check AMION for 5pm -7am coverage and on weekends   Note: This dictation was prepared with Dragon dictation along with smaller phrase technology. Any transcriptional errors that result from this process are unintentional.

## 2019-07-09 NOTE — Progress Notes (Addendum)
PROGRESS NOTE    Desiree Koch  [id number] DOB: 01/01/1931 DOA: 07/04/2019 PCP: Adin Hector, MD      Assessment & Plan:   Principal Problem:   Hyperkalemia Active Problems:   Acute kidney injury superimposed on chronic kidney disease (HCC)   AF (paroxysmal atrial fibrillation) (HCC)   Hypothyroidism   CAD (coronary artery disease)   Chronic systolic CHF (congestive heart failure) (HCC)   Other cirrhosis of liver (HCC)   Essential hypertension   Hyperkalemia: in setting of spironolactone and potassium supplementation use & AKI.  EKG has new frequent PVCs & nonsustained runs of VT. S/p Calcium gluconate, bicarb & lokelma 07/08/19.  Continue to hold spironolactone. Continue on tele. Labile, currently WNL today. Nephro following and recs apprec  AKI on CKD stage III: Cr continues to trend up daily. Likely secondary to medication effect in setting of spironolactone & possible cardiorenal and/or hepatorenal syndrome. Continue to hold spironolactone. Monitor I/Os. Nephro following and recs apprec  Cirrhosis: had recent paracentesis on 06/27/2019 with removal of 4 L peritoneal fluid. S/p paracentesis 07/07/19 w/ 4L and albumin given after. Restarted lasix as per nephro. Continue to hold spironolactone. GI consulted  Paroxysmal atrial fibrillation/flutter: rate controlled and frequent PVCs.  Not on anticoagulation due to prior history of bleeding. Restarted metoprolol but a reduced dose secondary to low end of normal BP. Recently started on digoxin   Chronic systolic CHF: EF 0000000 by TTE 06/16/2019. W/ likely cardiorenal and/or hepatorenal syndrome. Will continue on lasix as per nephro.   Thrombocytopenia: likely secondary to cirrhosis. Will continue to monitor   Transaminitis: resolved  Hyponatremia: likely secondary to cirrhosis. Will continue to monitor   Macrocytic anemia: likely secondary to cirrhosis. No need for a transfusion at this time   Hypertension: continue to  hold metoprolol due to low normal blood pressure.   CAD: chronic and stable.  Denies any chest pain.   Hypothyroidism: continue synthroid.   DVT prophylaxis: heparin SQ Code Status: full  Family Communication: discussed pt's care with pt's daughter, [name], and answered her questions Disposition Plan: will likely d/c w/ home health. Home health orders will be placed. Unlikely no barriers to d/c   Consultants:   nepho  GI   Procedures:    Antimicrobials:    Subjective: Pt c/o abd distention.   Objective: Vitals:   07/09/19 0500 07/09/19 0729 07/09/19 0750 07/09/19 1147  BP:   (!) 96/44 (!) 128/45  Pulse:  78 80 94  Resp:      Temp:   98.4 F (36.9 C) (!) 97.4 F (36.3 C)  TempSrc:   Oral Oral  SpO2:   97% 99%  Weight: 66 kg     Height:        Intake/Output Summary (Last 24 hours) at 07/09/2019 1242 Last data filed at 07/09/2019 1000 Gross per 24 hour  Intake 569.34 ml  Output 350 ml  Net 219.34 ml   Filed Weights   07/05/19 0500 07/07/19 0420 07/09/19 0500  Weight: 70.1 kg 67.8 kg 66 kg    Examination:  General exam: Appears calm and comfortable  Respiratory system: Clear to auscultation. No rales Cardiovascular system: irregularly irregular. No rubs, gallops or clicks.  Gastrointestinal system: Abdomen is distended, soft and nontender. Hypoactive bowel sounds heard. Central nervous system: Alert and oriented. Moves all 4 extremities Psychiatry: Judgement and insight appear abnormal. Flat mood and affect    Data Reviewed: I have personally reviewed following labs and imaging studies  CBC: Recent Labs  Lab 07/05/19 0500 07/06/19 0548 07/07/19 0446 07/08/19 0541 07/09/19 0721  WBC 8.6 7.9 8.2 6.2 7.2  HGB 10.3* 9.8* 10.6* 8.9* 8.2*  HCT 30.0* 28.8* 30.5* 25.6* 24.2*  MCV 104.5* 106.7* 102.3* 103.2* 106.6*  PLT 212 199 206 142* [id number]*   Basic Metabolic Panel: Recent Labs  Lab 07/04/19 1736 07/04/19 2145 07/05/19 0500 07/05/19 0500  07/05/19 1746 07/06/19 0548 07/07/19 0446 07/08/19 0541 07/09/19 0721  NA 129*  --  131*  --   --  131* 129* 129* 133*  K 6.3*   < > 5.2*   < > 5.5* 5.0 4.9 5.3* 4.8  CL 98  --  99  --   --  100 99 99 99  CO2 23  --  22  --   --  22 20* 22 26  GLUCOSE 104*  --  89  --   --  92 99 79 84  BUN 35*  --  36*  --   --  41* 43* 41* 44*  CREATININE 2.61*  --  2.60*  --   --  2.81* 2.90* 3.02* 3.30*  CALCIUM 9.8  --  9.4  --   --  9.1 9.0 8.8* 9.0  MG 2.2  --  2.1  --   --   --   --   --   --    < > = values in this interval not displayed.   GFR: Estimated Creatinine Clearance: 10.6 mL/min (A) (by C-G formula based on SCr of 3.3 mg/dL (H)). Liver Function Tests: Recent Labs  Lab 07/04/19 1736 07/06/19 0548 07/07/19 0446 07/08/19 0541 07/09/19 0721  AST 50* 43* 43* 34 29  ALT 15 13 13 11 11   ALKPHOS 87 80 83 57 54  BILITOT 3.1* 2.8* 2.9* 3.1* 2.6*  PROT 6.4* 5.6* 5.8* 5.6* 5.2*  ALBUMIN 3.2* 2.7* 2.8* 3.4* 3.4*   Recent Labs  Lab 07/04/19 1736  LIPASE 75*   No results for input(s): AMMONIA in the last 168 hours. Coagulation Profile: No results for input(s): INR, PROTIME in the last 168 hours. Cardiac Enzymes: No results for input(s): CKTOTAL, CKMB, CKMBINDEX, TROPONINI in the last 168 hours. BNP (last 3 results) No results for input(s): PROBNP in the last 8760 hours. HbA1C: No results for input(s): HGBA1C in the last 72 hours. CBG: Recent Labs  Lab 07/04/19 1842  GLUCAP 81   Lipid Profile: No results for input(s): CHOL, HDL, LDLCALC, TRIG, CHOLHDL, LDLDIRECT in the last 72 hours. Thyroid Function Tests: No results for input(s): TSH, T4TOTAL, FREET4, T3FREE, THYROIDAB in the last 72 hours. Anemia Panel: No results for input(s): VITAMINB12, FOLATE, FERRITIN, TIBC, IRON, RETICCTPCT in the last 72 hours. Sepsis Labs: No results for input(s): PROCALCITON, LATICACIDVEN in the last 168 hours.  Recent Results (from the past 240 hour(s))  SARS CORONAVIRUS 2 (TAT 6-24 HRS)  Nasopharyngeal Nasopharyngeal Swab     Status: None   Collection Time: 07/04/19  4:43 PM   Specimen: Nasopharyngeal Swab  Result Value Ref Range Status   SARS Coronavirus 2 NEGATIVE NEGATIVE Final    Comment: (NOTE) SARS-CoV-2 target nucleic acids are NOT DETECTED. The SARS-CoV-2 RNA is generally detectable in upper and lower respiratory specimens during the acute phase of infection. Negative results do not preclude SARS-CoV-2 infection, do not rule out co-infections with other pathogens, and should not be used as the sole basis for treatment or other patient management decisions. Negative results must be combined with clinical  observations, patient history, and epidemiological information. The expected result is Negative. Fact Sheet for Patients: SugarRoll.be Fact Sheet for Healthcare Providers: https://www.woods-mathews.com/ This test is not yet approved or cleared by the [place] FDA and  has been authorized for detection and/or diagnosis of SARS-CoV-2 by FDA under an Emergency Use Authorization (EUA). This EUA will remain  in effect (meaning this test can be used) for the duration of the COVID-19 declaration under Section 56 4(b)(1) of the Act, 21 U.S.C. section 360bbb-3(b)(1), unless the authorization is terminated or revoked sooner. Performed at [facility], [place] [address], [place], [place] [address]   Urine Culture     Status: Abnormal   Collection Time: 07/04/19  6:32 PM   Specimen: Urine, Clean Catch  Result Value Ref Range Status   Specimen Description   Final    URINE, CLEAN CATCH Performed at [facility], [address], [place], [place] [address]    Special Requests   Final    NONE Performed at [facility], [place] [address] [place],  [address]    Culture (A)  Final    80,000 COLONIES/mL MULTIPLE SPECIES PRESENT, SUGGEST RECOLLECTION   Report Status 07/06/2019 FINAL  Final          Radiology Studies: DG Abd 1 View  Result Date: 07/08/2019 CLINICAL DATA:  Abdominal distension. EXAM: ABDOMEN - 1 VIEW COMPARISON:  None. FINDINGS: The bowel gas pattern is normal. Moderate amount of stool is seen throughout the large bowel. No radio-opaque calculi or other significant radiographic abnormality are seen. There is evidence of prior vertebroplasty at the level of L2 vertebral body. Radiopaque surgical clips are seen overlying the right upper quadrant. IMPRESSION: Moderate stool burden without evidence of bowel obstruction. Electronically Signed   By: Virgina Norfolk M.D.   On: 07/08/2019 22:59        Scheduled Meds: . docusate sodium  200 mg Oral BID  . heparin  5,000 Units Subcutaneous Q8H  . lactulose  30 g Oral BID  . levothyroxine  75 mcg Oral Daily  . metoprolol succinate  50 mg Oral Daily  . midodrine  10 mg Oral TID WC  . pantoprazole  20 mg Oral Daily  . sodium bicarbonate  650 mg Oral BID   Continuous Infusions: . furosemide (LASIX) infusion 4 mg/hr (07/08/19 1505)     LOS: 4 days    Time spent: 35 mins    Wyvonnia Dusky, MD [facility] Pager [phone]  If 7PM-7AM, please contact night-coverage [email or web address] 07/09/2019, 12:42 PM

## 2019-07-09 NOTE — Progress Notes (Signed)
   07/09/19 0945  Clinical Encounter Type  Visited With Patient  Visit Type Initial;Spiritual support  Referral From Nurse  Consult/Referral To Chaplain  Chaplain visited with patient as of a OR, patient is going through a major life transition. Patient was sitting up when Chaplain entered the room. Chaplain asked patient how she was feeling and she said better. Patient wanted to pray for better life span, getting healthier, and longer life (she wants to stay around a little longer). Patient said this sickness was a surprise because she takes good care of herself. Chaplain said it could just have something to do with aging and not be of result of her not taking care of herself.  She has something going on with her heart (she said slight heart attack). Patient mentioned stress and Chaplain asked her about that stress. Patient told Chaplain that she lost a child within the last six months. Her daughter, Lovey Newcomer had brain cancer. She asked how can your child get brain cancer? But patient said she is in a better place. She had a good life, 2 sons who graduated from [employer] and who are doing well. Patient talked about living in the house that she and her husband built over thirty years ago. She is having a difficult time thinking of how she will manage once discharge. Chaplain asked if she had help and she said yes, her Daughter, Jeral Fruit is trying to line things up for her to come home. Chaplain asked if Jeral Fruit has spoken with a Education officer, museum and she said yes.  Patient had five children, one died and she has six grandchildren, and several great-grandchildren. Patient talked a lot about her family and how proud she is of each of them. Patient also talked about not losing faith in God He has seen her through many things. She mentioned several times when she thought she was going to die and how God spared her life. Patient said that she has lived a good and honest life. God has been with her every step of the way. The majority  of the conversation was about patient's family and her relationship with God. It was encouraging to visit with patient.

## 2019-07-09 NOTE — TOC Initial Note (Signed)
Transition of Care [facility]) - Initial/Assessment Note    Patient Details  Name: Desiree Koch MRN: [id number] Date of Birth: 13-Jan-1931  Transition [facility]) CM/SW Contact:    Gelene Mink, [place] Phone Number: 07/09/2019, 10:46 AM  Clinical Narrative:                  Patient currently active with [facility] for home health nursing needs. CSW reached out to [place] to discuss adding PT and OT.   CSW is awaiting a return phone call.   Expected Discharge Plan: [place] Barriers to Discharge: Continued Medical Work up   Patient Goals and CMS Choice   CMS Medicare.gov Compare Post Acute Care list provided to:: Patient Choice offered to / list presented to : Patient  Expected Discharge Plan and Services Expected Discharge Plan: [place] In-house Referral: Clinical Social Work                                            Prior Living Arrangements/Services              Need for Family Participation in Patient Care: No (Comment) Care giver support system in place?: Yes (comment) Current home services: Home RN    Activities of Daily Living Home Assistive Devices/Equipment: Cane (specify quad or straight) ADL Screening (condition at time of admission) Patient's cognitive ability adequate to safely complete daily activities?: Yes Is the patient deaf or have difficulty hearing?: No Does the patient have difficulty seeing, even when wearing glasses/contacts?: No Does the patient have difficulty concentrating, remembering, or making decisions?: No Patient able to express need for assistance with ADLs?: Yes Does the patient have difficulty dressing or bathing?: No Independently performs ADLs?: Yes (appropriate for developmental age) Does the patient have difficulty walking or climbing stairs?: No Weakness of Legs: Both Weakness of Arms/Hands: Both  Permission Sought/Granted                  Emotional  Assessment Appearance:: Appears stated age     Orientation: : Oriented to Self, Oriented to  Time, Oriented to Place, Oriented to Situation Alcohol / Substance Use: Not Applicable Psych Involvement: No (comment)  Admission diagnosis:  Hyperkalemia [E87.5] Ventricular ectopy [I49.3] Acute renal failure superimposed on chronic kidney disease, unspecified CKD stage, unspecified acute renal failure type ([place]) [N17.9, N18.9] Patient Active Problem List   Diagnosis Date Noted  . Hyperkalemia 07/04/2019  . Essential hypertension 07/04/2019  . Other cirrhosis of liver ([place]) 06/18/2019  . Ascites 06/18/2019  . Anemia 06/18/2019  . Acute on chronic systolic CHF (congestive heart failure) ([place]) 06/15/2019  . Acute kidney injury superimposed on chronic kidney disease ([place]) 06/15/2019  . AF (paroxysmal atrial fibrillation) ([place]) 06/15/2019  . Hypothyroidism 06/15/2019  . CAD (coronary artery disease) 06/15/2019  . Chronic systolic CHF (congestive heart failure) ([place]) 06/15/2019  . Bradycardia 06/16/2017   PCP:  Adin Hector, MD Pharmacy:   [facility] [address], [place] - [place] [address] [place] [address] Phone: [phone] Fax: [phone]     Social Determinants of Health (SDOH) Interventions    Readmission Risk Interventions No flowsheet data found.

## 2019-07-09 NOTE — Progress Notes (Signed)
[facility]  ROUNDING NOTE   Subjective:   Patient is doing fair Appetite remains poor Abdomen is still distended Yesterday was started on IV Lasix infusion and IV albumin supplementation   Objective:  Vital signs in last 24 hours:  Temp:  [97.4 F (36.3 C)-98.4 F (36.9 C)] 97.4 F (36.3 C) (04/11 1147) Pulse Rate:  [40-94] 94 (04/11 1147) Resp:  [16-18] 18 (04/11 0430) BP: (96-128)/(44-62) 128/45 (04/11 1147) SpO2:  [97 %-99 %] 99 % (04/11 1147) Weight:  [66 kg] 66 kg (04/11 0500)  Weight change:  Filed Weights   07/05/19 0500 07/07/19 0420 07/09/19 0500  Weight: 70.1 kg 67.8 kg 66 kg    Intake/Output: I/O last 3 completed shifts: In: 809.3 [P.O.:800; I.V.:9.3] Out: 800 [Urine:800]   Intake/Output this shift:  Total I/O In: 120 [P.O.:120] Out: 150 [Urine:150]  Physical Exam: General: NAD,   Head: Normocephalic, atraumatic. Moist oral mucosal membranes  Eyes: Anicteric,  Neck: Supple,  Lungs:  Clear to auscultation  Heart: Regular rate and rhythm  Abdomen:  +ascites  Extremities: trace peripheral edema.  Neurologic: Nonfocal, alert, able to answer questions appropriately  Skin: No lesions        Basic Metabolic Panel: Recent Labs  Lab 07/04/19 1736 07/04/19 2145 07/05/19 0500 07/05/19 0500 07/05/19 1746 07/06/19 0548 07/06/19 0548 07/07/19 0446 07/08/19 0541 07/09/19 0721  NA 129*  --  131*  --   --  131*  --  129* 129* 133*  K 6.3*   < > 5.2*   < > 5.5* 5.0  --  4.9 5.3* 4.8  CL 98  --  99  --   --  100  --  99 99 99  CO2 23  --  22  --   --  22  --  20* 22 26  GLUCOSE 104*  --  89  --   --  92  --  99 79 84  BUN 35*  --  36*  --   --  41*  --  43* 41* 44*  CREATININE 2.61*  --  2.60*  --   --  2.81*  --  2.90* 3.02* 3.30*  CALCIUM 9.8  --  9.4   < >  --  9.1   < > 9.0 8.8* 9.0  MG 2.2  --  2.1  --   --   --   --   --   --   --    < > = values in this interval not displayed.    Liver Function Tests: Recent Labs  Lab  07/04/19 1736 07/06/19 0548 07/07/19 0446 07/08/19 0541 07/09/19 0721  AST 50* 43* 43* 34 29  ALT 15 13 13 11 11   ALKPHOS 87 80 83 57 54  BILITOT 3.1* 2.8* 2.9* 3.1* 2.6*  PROT 6.4* 5.6* 5.8* 5.6* 5.2*  ALBUMIN 3.2* 2.7* 2.8* 3.4* 3.4*   Recent Labs  Lab 07/04/19 1736  LIPASE 75*   No results for input(s): AMMONIA in the last 168 hours.  CBC: Recent Labs  Lab 07/05/19 0500 07/06/19 0548 07/07/19 0446 07/08/19 0541 07/09/19 0721  WBC 8.6 7.9 8.2 6.2 7.2  HGB 10.3* 9.8* 10.6* 8.9* 8.2*  HCT 30.0* 28.8* 30.5* 25.6* 24.2*  MCV 104.5* 106.7* 102.3* 103.2* 106.6*  PLT 212 199 206 142* 143*    Cardiac Enzymes: No results for input(s): CKTOTAL, CKMB, CKMBINDEX, TROPONINI in the last 168 hours.  BNP: Invalid input(s): POCBNP  CBG: Recent Labs  Lab 07/04/19 1842  GLUCAP 41    Microbiology: Results for orders placed or performed during the hospital encounter of 07/04/19  SARS CORONAVIRUS 2 (TAT 6-24 HRS) Nasopharyngeal Nasopharyngeal Swab     Status: None   Collection Time: 07/04/19  4:43 PM   Specimen: Nasopharyngeal Swab  Result Value Ref Range Status   SARS Coronavirus 2 NEGATIVE NEGATIVE Final    Comment: (NOTE) SARS-CoV-2 target nucleic acids are NOT DETECTED. The SARS-CoV-2 RNA is generally detectable in upper and lower respiratory specimens during the acute phase of infection. Negative results do not preclude SARS-CoV-2 infection, do not rule out co-infections with other pathogens, and should not be used as the sole basis for treatment or other patient management decisions. Negative results must be combined with clinical observations, patient history, and epidemiological information. The expected result is Negative. Fact Sheet for Patients: SugarRoll.be Fact Sheet for Healthcare Providers: https://www.woods-mathews.com/ This test is not yet approved or cleared by the [place] FDA and  has been authorized  for detection and/or diagnosis of SARS-CoV-2 by FDA under an Emergency Use Authorization (EUA). This EUA will remain  in effect (meaning this test can be used) for the duration of the COVID-19 declaration under Section 56 4(b)(1) of the Act, 21 U.S.C. section 360bbb-3(b)(1), unless the authorization is terminated or revoked sooner. Performed at [facility], [place] [address] [place], [place] [address]   Urine Culture     Status: Abnormal   Collection Time: 07/04/19  6:32 PM   Specimen: Urine, Clean Catch  Result Value Ref Range Status   Specimen Description   Final    URINE, CLEAN CATCH Performed at [facility], [address], [place], [place] [address]    Special Requests   Final    NONE Performed at [facility], [place] [address] [place], [place] [address]    Culture (A)  Final    80,000 COLONIES/mL MULTIPLE SPECIES PRESENT, SUGGEST RECOLLECTION   Report Status 07/06/2019 FINAL  Final    Coagulation Studies: No results for input(s): LABPROT, INR in the last 72 hours.  Urinalysis: No results for input(s): COLORURINE, LABSPEC, PHURINE, GLUCOSEU, HGBUR, BILIRUBINUR, KETONESUR, PROTEINUR, UROBILINOGEN, NITRITE, LEUKOCYTESUR in the last 72 hours.  Invalid input(s): APPERANCEUR    Imaging: DG Abd 1 View  Result Date: 07/08/2019 CLINICAL DATA:  Abdominal distension. EXAM: ABDOMEN - 1 VIEW COMPARISON:  None. FINDINGS: The bowel gas pattern is normal. Moderate amount of stool is seen throughout the large bowel. No radio-opaque calculi or other significant radiographic abnormality are seen. There is evidence of prior vertebroplasty at the level of L2 vertebral body. Radiopaque surgical clips are seen overlying the right upper quadrant. IMPRESSION: Moderate stool burden without evidence of bowel obstruction. Electronically Signed   By: Virgina Norfolk M.D.   On: 07/08/2019 22:59     Medications:   . furosemide (LASIX) infusion 4 mg/hr (07/08/19 1505)   .  docusate sodium  200 mg Oral BID  . heparin  5,000 Units Subcutaneous Q8H  . lactulose  30 g Oral BID  . levothyroxine  75 mcg Oral Daily  . metoprolol succinate  50 mg Oral Daily  . midodrine  10 mg Oral TID WC  . pantoprazole  20 mg Oral Daily  . sodium bicarbonate  650 mg Oral BID   acetaminophen **OR** acetaminophen, ondansetron **OR** ondansetron (ZOFRAN) IV  Assessment/ Plan:  [name] is a 86 y.o. white female withsystolic congestive heart failure, atrial fibrillation,  hypertension, hypothyroidism, hepatic cirrhosis, coronary artery diseaseadmitted on 07/04/2019 for Hyperkalemia [E87.5] Ventricular ectopy [I49.3] Acute renal failure superimposed on chronic kidney disease, unspecified CKD stage, unspecified acute renal failure type ([place]) [N17.9, N18.9]  June 16, 2019: 2D echo: LVEF 20 to 25%, regional wall motion abnormalities, left ventricular internal cavity moderately dilated, mildly elevated pulmonary artery systolic pressure, dilated left and right atrium  1. Acute renal failure with hyperkalemia on chronic kidney disease stage IIIB:  #Chronic systolic congestive heart failure, pulmonary hypertension baseline creatinine of 1.4, GFR of 36 on 04/13/2019.  Chronic Kidney Disease most likely due to hypertensive nephrosclerosis Worsening of renal failure is likely secondary to abnormal cardiorenal hemodynamics Unfortunately patient serum creatinine is worse today at 3.02->3.3 Less likely hepatorenal syndrome.  With underlying cardiac arrhythmias and hypotension, patient is more likely to have ATN related renal insufficiency Continue midodrine in her regimen to maintain higher blood pressures.  Trial of IV furosemide to control edema and for relief of ascites  2. Cirrhosis with ascites: 4 liters.  Removed with paracentesis on April 9 Furosemide restarted as low-dose IV infusion for sustained effect Continue to hold spironolactone due to hyperkalemia.  Overall  prognosis appears poor.  Consider palliative care evaluation to clarify goals of care   patient's daughterJodie Cambell [phone]   LOS: 4 Corbin Hott Candiss Norse 4/11/20211:45 PM

## 2019-07-09 NOTE — Progress Notes (Addendum)
Acute renal failure likely hepatorenal syndrome given degree of ascites also contributing is low intravascular volume. Albumin ordered and will eval for urine output improvement. Foley placed

## 2019-07-09 NOTE — TOC Progression Note (Signed)
Transition of Care [facility]) - Progression Note    Patient Details  Name: Desiree Koch MRN: [id number] Date of Birth: 1931-02-14  Transition of Care [facility] CM/SW [place], [place] Phone Number: 07/09/2019, 1:08 PM  Clinical Narrative:     CSW spoke with [place] from [place]. They are able to add PT and OT. They will need home health orders.   CSW will continue to follow and assist with disposition planning.   Expected Discharge Plan: [place] Barriers to Discharge: Continued Medical Work up  Expected Discharge Plan and Services Expected Discharge Plan: [place] In-house Referral: Clinical Social Work                                             Social Determinants of Health (SDOH) Interventions    Readmission Risk Interventions No flowsheet data found.

## 2019-07-10 DIAGNOSIS — N179 Acute kidney failure, unspecified: Secondary | ICD-10-CM | POA: Diagnosis not present

## 2019-07-10 DIAGNOSIS — K7469 Other cirrhosis of liver: Secondary | ICD-10-CM | POA: Diagnosis not present

## 2019-07-10 DIAGNOSIS — N189 Chronic kidney disease, unspecified: Secondary | ICD-10-CM

## 2019-07-10 DIAGNOSIS — R188 Other ascites: Secondary | ICD-10-CM

## 2019-07-10 DIAGNOSIS — I5022 Chronic systolic (congestive) heart failure: Secondary | ICD-10-CM | POA: Diagnosis not present

## 2019-07-10 LAB — COMPREHENSIVE METABOLIC PANEL
ALT: 10 U/L (ref 0–44)
AST: 34 U/L (ref 15–41)
Albumin: 3.3 g/dL — ABNORMAL LOW (ref 3.5–5.0)
Alkaline Phosphatase: 65 U/L (ref 38–126)
Anion gap: 10 (ref 5–15)
BUN: 47 mg/dL — ABNORMAL HIGH (ref 8–23)
CO2: 24 mmol/L (ref 22–32)
Calcium: 8.9 mg/dL (ref 8.9–10.3)
Chloride: 97 mmol/L — ABNORMAL LOW (ref 98–111)
Creatinine, Ser: 3.71 mg/dL — ABNORMAL HIGH (ref 0.44–1.00)
GFR calc Af Amer: 12 mL/min — ABNORMAL LOW (ref >60)
GFR calc non Af Amer: 10 mL/min — ABNORMAL LOW (ref >60)
Glucose, Bld: 78 mg/dL (ref 70–99)
Potassium: 4.6 mmol/L (ref 3.5–5.1)
Sodium: 131 mmol/L — ABNORMAL LOW (ref 135–145)
Total Bilirubin: 2.8 mg/dL — ABNORMAL HIGH (ref 0.3–1.2)
Total Protein: 5.6 g/dL — ABNORMAL LOW (ref 6.5–8.1)

## 2019-07-10 LAB — CBC
HCT: 28.7 % — ABNORMAL LOW (ref 36.0–46.0)
Hemoglobin: 9.7 g/dL — ABNORMAL LOW (ref 12.0–15.0)
MCH: 35.9 pg — ABNORMAL HIGH (ref 26.0–34.0)
MCHC: 33.8 g/dL (ref 30.0–36.0)
MCV: 106.3 fL — ABNORMAL HIGH (ref 80.0–100.0)
Platelets: 187 10*3/uL (ref 150–400)
RBC: 2.7 MIL/uL — ABNORMAL LOW (ref 3.87–5.11)
RDW: 16.3 % — ABNORMAL HIGH (ref 11.5–15.5)
WBC: 8.6 10*3/uL (ref 4.0–10.5)
nRBC: 0 % (ref 0.0–0.2)

## 2019-07-10 MED ORDER — MIRTAZAPINE 15 MG PO TABS
15.0000 mg | ORAL_TABLET | Freq: Every day | ORAL | Status: DC
  Administered 2019-07-11 – 2019-07-12 (×3): 15 mg via ORAL
  Filled 2019-07-10 (×3): qty 1

## 2019-07-10 MED ORDER — ALBUMIN HUMAN 25 % IV SOLN
12.5000 g | Freq: Two times a day (BID) | INTRAVENOUS | Status: DC
  Administered 2019-07-10 – 2019-07-11 (×3): 12.5 g via INTRAVENOUS
  Filled 2019-07-10 (×6): qty 50

## 2019-07-10 NOTE — Progress Notes (Signed)
PROGRESS NOTE    Desiree Koch  [id number] DOB: 20-Mar-1931 DOA: 07/04/2019 PCP: Adin Hector, MD      Assessment & Plan:   Principal Problem:   Hyperkalemia Active Problems:   Acute kidney injury superimposed on chronic kidney disease (HCC)   AF (paroxysmal atrial fibrillation) (HCC)   Hypothyroidism   CAD (coronary artery disease)   Chronic systolic CHF (congestive heart failure) (HCC)   Other cirrhosis of liver (HCC)   Essential hypertension   Hyperkalemia: in setting of spironolactone and potassium supplementation use & AKI.  EKG has new frequent PVCs & nonsustained runs of VT. S/p Calcium gluconate, bicarb & lokelma 07/08/19.  Continue to hold spironolactone. Continue on tele. Labile, currently WNL today. Nephro following and recs apprec  AKI on CKD stage III: Cr is trending up daily. Likely secondary to ATN & possible cardiorenal and/or hepatorenal syndrome. Less likely hepatorenal as per GI & nephro. Continue to hold spironolactone. Monitor I/Os. Nephro following and recs apprec  Cirrhosis: had recent paracentesis on 06/27/2019 with removal of 4 L peritoneal fluid. S/p paracentesis 07/07/19 w/ 4L and albumin given after. Continue on lasix drip as per nephro. Continue to hold spironolactone. Continue supportive care as per GI. MELD score at least 28   Paroxysmal atrial fibrillation/flutter: rate controlled and frequent PVCs.  Not on anticoagulation due to prior history of bleeding. Restarted metoprolol but a reduced dose secondary to low end of normal BP. Recently started on digoxin   Chronic systolic CHF: EF 0000000 by TTE 06/16/2019. W/ likely cardiorenal and/or hepatorenal syndrome. Less likely hepatorenal as per GI & nephro. Will continue on lasix as per nephro.   Poor po intake: start on mirtazapine   Thrombocytopenia: likely secondary to cirrhosis. Will continue to monitor   Transaminitis: resolved  Hyponatremia: likely secondary to cirrhosis. Will continue to  monitor   Macrocytic anemia: likely secondary to cirrhosis. No need for a transfusion at this time   Hypertension: continue to hold metoprolol due to low normal blood pressure. Continue on midodrine as per nephro   CAD: chronic and stable.  Denies any chest pain.   Hypothyroidism: continue synthroid.   DVT prophylaxis: heparin SQ Code Status: full  Family Communication: discussed pt's care with pt's daughter, [name], and answered her questions Disposition Plan: will likely d/c w/ home health. Home health orders will be placed. Unlikely no barriers to d/c   Consultants:   nepho  GI   Procedures:    Antimicrobials:    Subjective: Pt c/o abd distention.   Objective: Vitals:   07/09/19 1147 07/09/19 2148 07/09/19 2200 07/10/19 0601  BP: (!) 128/45 (!) 101/56  (!) 100/49  Pulse: 94 (!) 33 83 (!) 58  Resp:  20  (!) 24  Temp: (!) 97.4 F (36.3 C) 97.8 F (36.6 C)  97.9 F (36.6 C)  TempSrc: Oral Oral  Oral  SpO2: 99% 97%  100%  Weight:      Height:        Intake/Output Summary (Last 24 hours) at 07/10/2019 0756 Last data filed at 07/10/2019 0601 Gross per 24 hour  Intake 120 ml  Output 850 ml  Net -730 ml   Filed Weights   07/05/19 0500 07/07/19 0420 07/09/19 0500  Weight: 70.1 kg 67.8 kg 66 kg    Examination:  General exam: Appears calm and comfortable  Respiratory system: Clear to auscultation. No rales Cardiovascular system: irregularly irregular. No rubs, gallops or clicks.  Gastrointestinal system: Abdomen is distended,  soft and nontender. Hypoactive bowel sounds heard. Central nervous system: Alert and oriented. Moves all 4 extremities Psychiatry: Judgement and insight appear abnormal. Flat mood and affect    Data Reviewed: I have personally reviewed following labs and imaging studies  CBC: Recent Labs  Lab 07/06/19 0548 07/07/19 0446 07/08/19 0541 07/09/19 0721 07/10/19 0611  WBC 7.9 8.2 6.2 7.2 8.6  HGB 9.8* 10.6* 8.9* 8.2* 9.7*  HCT  28.8* 30.5* 25.6* 24.2* 28.7*  MCV 106.7* 102.3* 103.2* 106.6* 106.3*  PLT 199 206 142* 143* [id number]   Basic Metabolic Panel: Recent Labs  Lab 07/04/19 1736 07/04/19 2145 07/05/19 0500 07/05/19 1746 07/06/19 0548 07/07/19 0446 07/08/19 0541 07/09/19 0721 07/10/19 0611  NA 129*  --  131*  --  131* 129* 129* 133* 131*  K 6.3*   < > 5.2*   < > 5.0 4.9 5.3* 4.8 4.6  CL 98  --  99  --  100 99 99 99 97*  CO2 23  --  22  --  22 20* 22 26 24   GLUCOSE 104*  --  89  --  92 99 79 84 78  BUN 35*  --  36*  --  41* 43* 41* 44* 47*  CREATININE 2.61*  --  2.60*  --  2.81* 2.90* 3.02* 3.30* 3.71*  CALCIUM 9.8  --  9.4  --  9.1 9.0 8.8* 9.0 8.9  MG 2.2  --  2.1  --   --   --   --   --   --    < > = values in this interval not displayed.   GFR: Estimated Creatinine Clearance: 9.5 mL/min (A) (by C-G formula based on SCr of 3.71 mg/dL (H)). Liver Function Tests: Recent Labs  Lab 07/06/19 0548 07/07/19 0446 07/08/19 0541 07/09/19 0721 07/10/19 0611  AST 43* 43* 34 29 34  ALT 13 13 11 11 10   ALKPHOS 80 83 57 54 65  BILITOT 2.8* 2.9* 3.1* 2.6* 2.8*  PROT 5.6* 5.8* 5.6* 5.2* 5.6*  ALBUMIN 2.7* 2.8* 3.4* 3.4* 3.3*   Recent Labs  Lab 07/04/19 1736  LIPASE 75*   No results for input(s): AMMONIA in the last 168 hours. Coagulation Profile: No results for input(s): INR, PROTIME in the last 168 hours. Cardiac Enzymes: No results for input(s): CKTOTAL, CKMB, CKMBINDEX, TROPONINI in the last 168 hours. BNP (last 3 results) No results for input(s): PROBNP in the last 8760 hours. HbA1C: No results for input(s): HGBA1C in the last 72 hours. CBG: Recent Labs  Lab 07/04/19 1842  GLUCAP 81   Lipid Profile: No results for input(s): CHOL, HDL, LDLCALC, TRIG, CHOLHDL, LDLDIRECT in the last 72 hours. Thyroid Function Tests: No results for input(s): TSH, T4TOTAL, FREET4, T3FREE, THYROIDAB in the last 72 hours. Anemia Panel: No results for input(s): VITAMINB12, FOLATE, FERRITIN, TIBC, IRON,  RETICCTPCT in the last 72 hours. Sepsis Labs: No results for input(s): PROCALCITON, LATICACIDVEN in the last 168 hours.  Recent Results (from the past 240 hour(s))  SARS CORONAVIRUS 2 (TAT 6-24 HRS) Nasopharyngeal Nasopharyngeal Swab     Status: None   Collection Time: 07/04/19  4:43 PM   Specimen: Nasopharyngeal Swab  Result Value Ref Range Status   SARS Coronavirus 2 NEGATIVE NEGATIVE Final    Comment: (NOTE) SARS-CoV-2 target nucleic acids are NOT DETECTED. The SARS-CoV-2 RNA is generally detectable in upper and lower respiratory specimens during the acute phase of infection. Negative results do not preclude SARS-CoV-2 infection, do not rule  out co-infections with other pathogens, and should not be used as the sole basis for treatment or other patient management decisions. Negative results must be combined with clinical observations, patient history, and epidemiological information. The expected result is Negative. Fact Sheet for Patients: SugarRoll.be Fact Sheet for Healthcare Providers: https://www.woods-mathews.com/ This test is not yet approved or cleared by the [place] FDA and  has been authorized for detection and/or diagnosis of SARS-CoV-2 by FDA under an Emergency Use Authorization (EUA). This EUA will remain  in effect (meaning this test can be used) for the duration of the COVID-19 declaration under Section 56 4(b)(1) of the Act, 21 U.S.C. section 360bbb-3(b)(1), unless the authorization is terminated or revoked sooner. Performed at [facility], [place] [address] [place], [place] [address]   Urine Culture     Status: Abnormal   Collection Time: 07/04/19  6:32 PM   Specimen: Urine, Clean Catch  Result Value Ref Range Status   Specimen Description   Final    URINE, CLEAN CATCH Performed at [facility], [address], [place], [place] [address]    Special Requests   Final    NONE Performed at [facility], [place] [address], [place], [place] [address]    Culture (A)  Final    80,000 COLONIES/mL MULTIPLE SPECIES PRESENT, SUGGEST RECOLLECTION   Report Status 07/06/2019 FINAL  Final         Radiology Studies: DG Abd 1 View  Result Date: 07/08/2019 CLINICAL DATA:  Abdominal distension. EXAM: ABDOMEN - 1 VIEW COMPARISON:  None. FINDINGS: The bowel gas pattern is normal. Moderate amount of stool is seen throughout the large bowel. No radio-opaque calculi or other significant radiographic abnormality are seen. There is evidence of prior vertebroplasty at the level of L2 vertebral body. Radiopaque surgical clips are seen overlying the right upper quadrant. IMPRESSION: Moderate stool burden without evidence of bowel obstruction. Electronically Signed   By: Virgina Norfolk M.D.   On: 07/08/2019 22:59        Scheduled Meds: . docusate sodium  200 mg Oral BID  . heparin  5,000 Units Subcutaneous Q8H  . lactulose  30 g Oral BID  . levothyroxine  75 mcg Oral Daily  . metoprolol succinate  50 mg Oral Daily  . midodrine  10 mg Oral TID WC  . pantoprazole  20 mg Oral Daily  . sodium bicarbonate  650 mg Oral BID   Continuous Infusions: . furosemide (LASIX) infusion 4 mg/hr (07/08/19 1505)     LOS: 5 days    Time spent: 35 mins    Wyvonnia Dusky, MD [facility] Pager [phone]  If 7PM-7AM, please contact night-coverage [email or web address] 07/10/2019, 7:56 AM

## 2019-07-10 NOTE — Care Management Important Message (Signed)
Important Message  Patient Details  Name: Desiree Koch MRN: [id number] Date of Birth: 07-12-1930   Medicare Important Message Given:  Yes     Dannette Barbara 07/10/2019, 10:59 AM

## 2019-07-10 NOTE — Progress Notes (Signed)
Desiree Darby, MD [address]  [place]  [place], [place] [address]  Main: [phone]  Fax: [phone] Pager: [phone]   Subjective: Patient is lethargic, denies abdominal pain.  She reports that her abdomen feels better after removing fluid.  She would like to eat.  Her renal function is worsening.    Objective: Vital signs in last 24 hours: Vitals:   07/09/19 2200 07/10/19 0601 07/10/19 0843 07/10/19 1212  BP:  (!) 100/49 (!) 102/47 (!) 117/39  Pulse: 83 (!) 58 89 (!) 44  Resp:  (!) 24 16   Temp:  97.9 F (36.6 C) 98.6 F (37 C) (!) 97.5 F (36.4 C)  TempSrc:  Oral  Oral  SpO2:  100% 98% 99%  Weight:      Height:       Weight change:   Intake/Output Summary (Last 24 hours) at 07/10/2019 1600 Last data filed at 07/10/2019 1522 Gross per 24 hour  Intake 215.93 ml  Output 700 ml  Net -484.07 ml     Exam: Heart:: Irregular, regular rate Lungs: normal and clear to auscultation Abdomen: soft, nontender, normal bowel sounds   Lab Results: CBC Latest Ref Rng & Units 07/10/2019 07/09/2019 07/08/2019  WBC 4.0 - 10.5 K/uL 8.6 7.2 6.2  Hemoglobin 12.0 - 15.0 g/dL 9.7(L) 8.2(L) 8.9(L)  Hematocrit 36.0 - 46.0 % 28.7(L) 24.2(L) 25.6(L)  Platelets 150 - 400 K/uL 187 143(L) 142(L)   CMP Latest Ref Rng & Units 07/10/2019 07/09/2019 07/08/2019  Glucose 70 - 99 mg/dL 78 84 79  BUN 8 - 23 mg/dL 47(H) 44(H) 41(H)  Creatinine 0.44 - 1.00 mg/dL 3.71(H) 3.30(H) 3.02(H)  Sodium 135 - 145 mmol/L 131(L) 133(L) 129(L)  Potassium 3.5 - 5.1 mmol/L 4.6 4.8 5.3(H)  Chloride 98 - 111 mmol/L 97(L) 99 99  CO2 22 - 32 mmol/L 24 26 22   Calcium 8.9 - 10.3 mg/dL 8.9 9.0 8.8(L)  Total Protein 6.5 - 8.1 g/dL 5.6(L) 5.2(L) 5.6(L)  Total Bilirubin 0.3 - 1.2 mg/dL 2.8(H) 2.6(H) 3.1(H)  Alkaline Phos 38 - 126 U/L 65 54 57  AST 15 - 41 U/L 34 29 34  ALT 0 - 44 U/L 10 11 11     Micro Results: Recent Results (from the past 240 hour(s))  SARS CORONAVIRUS 2 (TAT 6-24 HRS)  Nasopharyngeal Nasopharyngeal Swab     Status: None   Collection Time: 07/04/19  4:43 PM   Specimen: Nasopharyngeal Swab  Result Value Ref Range Status   SARS Coronavirus 2 NEGATIVE NEGATIVE Final    Comment: (NOTE) SARS-CoV-2 target nucleic acids are NOT DETECTED. The SARS-CoV-2 RNA is generally detectable in upper and lower respiratory specimens during the acute phase of infection. Negative results do not preclude SARS-CoV-2 infection, do not rule out co-infections with other pathogens, and should not be used as the sole basis for treatment or other patient management decisions. Negative results must be combined with clinical observations, patient history, and epidemiological information. The expected result is Negative. Fact Sheet for Patients: SugarRoll.be Fact Sheet for Healthcare Providers: https://www.woods-mathews.com/ This test is not yet approved or cleared by the [place] FDA and  has been authorized for detection and/or diagnosis of SARS-CoV-2 by FDA under an Emergency Use Authorization (EUA). This EUA will remain  in effect (meaning this test can be used) for the duration of the COVID-19 declaration under Section 56 4(b)(1) of the Act, 21 U.S.C. section 360bbb-3(b)(1), unless the authorization is terminated or revoked sooner. Performed at [facility], [place] [address], [place], [place] [address]   Urine Culture     Status: Abnormal   Collection Time: 07/04/19  6:32 PM   Specimen: Urine, Clean Catch  Result Value Ref Range Status   Specimen Description   Final    URINE, CLEAN CATCH Performed at [facility], [address] [place], [place] [address]    Special Requests   Final    NONE Performed at [facility], [place] [address] [place], [place] [address]    Culture (A)  Final    80,000 COLONIES/mL MULTIPLE SPECIES PRESENT, SUGGEST RECOLLECTION   Report Status 07/06/2019 FINAL  Final    Studies/Results: DG Abd 1 View  Result Date: 07/08/2019 CLINICAL DATA:  Abdominal distension. EXAM: ABDOMEN - 1 VIEW COMPARISON:  None. FINDINGS: The bowel gas pattern is normal. Moderate amount of stool is seen throughout the large bowel. No radio-opaque calculi or other significant radiographic abnormality are seen. There is evidence of prior vertebroplasty at the level of L2 vertebral body. Radiopaque surgical clips are seen overlying the right upper quadrant. IMPRESSION: Moderate stool burden without evidence of bowel obstruction. Electronically Signed   By: Virgina Norfolk M.D.   On: 07/08/2019 22:59   Medications:  I have reviewed the patient's current medications. Prior to Admission:  Medications Prior to Admission  Medication Sig Dispense Refill Last Dose  . acetaminophen (TYLENOL) 325 MG tablet Take 325-650 mg by mouth every 6 (six) hours as needed for mild pain or moderate pain.    PRN at PRN  . cholecalciferol (VITAMIN D3) 25 MCG (1000 UT) tablet Take 2,000 Units by mouth daily.    07/04/2019 at 0900  . digoxin 62.5 MCG TABS Take 0.0625 mg by mouth daily. 30 tablet 2 07/04/2019 at 0900  . fexofenadine (ALLEGRA) 180 MG tablet Take 180 mg by mouth at bedtime.    07/04/2019 at 0900  . fluticasone (FLONASE) 50 MCG/ACT nasal spray Place 2 sprays into both nostrils daily as needed for rhinitis.    07/04/2019 at 0900  . furosemide (LASIX) 40 MG tablet Take 1 tablet (40 mg total) by mouth daily. (Patient taking differently: Take 80 mg by mouth daily. ) 30 tablet 2 07/04/2019 at Unknown time  . Glucosamine 500 MG CAPS Take 500 mg by mouth daily.    07/04/2019 at Unknown time  . levothyroxine (SYNTHROID, LEVOTHROID) 75 MCG tablet Take 75 mcg by mouth daily.   07/04/2019 at Unknown time  . metoprolol succinate (TOPROL-XL) 100 MG 24 hr tablet Take 1 tablet (100 mg total) by mouth daily. 30 tablet 2 07/04/2019 at Unknown time  . Multiple Vitamins-Minerals (PRESERVISION AREDS 2+MULTI VIT PO) Take 2 tablets by  mouth daily.   07/04/2019 at Unknown time  . potassium chloride SA (KLOR-CON) 20 MEQ tablet Take 20 mEq by mouth daily.   07/04/2019 at Unknown time  . spironolactone (ALDACTONE) 25 MG tablet Take 0.5 tablets (12.5 mg total) by mouth daily. 15 tablet 2 07/04/2019 at Unknown time   Scheduled: . docusate sodium  200 mg Oral BID  . heparin  5,000 Units Subcutaneous Q8H  . lactulose  30 g Oral BID  . levothyroxine  75 mcg Oral Daily  . metoprolol succinate  50 mg Oral Daily  . midodrine  10 mg Oral TID WC  . mirtazapine  15 mg Oral QHS  . pantoprazole  20 mg Oral Daily  . sodium bicarbonate  650 mg Oral BID   Continuous: . albumin  human Stopped (07/10/19 1309)  . furosemide (LASIX) infusion 4 mg/hr (07/10/19 1522)   [id number] **OR** acetaminophen, ondansetron **OR** ondansetron (ZOFRAN) IV Anti-infectives (From admission, onward)   None     Scheduled Meds: . docusate sodium  200 mg Oral BID  . heparin  5,000 Units Subcutaneous Q8H  . lactulose  30 g Oral BID  . levothyroxine  75 mcg Oral Daily  . metoprolol succinate  50 mg Oral Daily  . midodrine  10 mg Oral TID WC  . mirtazapine  15 mg Oral QHS  . pantoprazole  20 mg Oral Daily  . sodium bicarbonate  650 mg Oral BID   Continuous Infusions: . albumin human Stopped (07/10/19 1309)  . furosemide (LASIX) infusion 4 mg/hr (07/10/19 1522)   PRN Meds:.acetaminophen **OR** acetaminophen, ondansetron **OR** ondansetron (ZOFRAN) IV   Assessment: Principal Problem:   Hyperkalemia Active Problems:   Acute kidney injury superimposed on chronic kidney disease (HCC)   AF (paroxysmal atrial fibrillation) (HCC)   Hypothyroidism   CAD (coronary artery disease)   Chronic systolic CHF (congestive heart failure) (HCC)   Other cirrhosis of liver (HCC)   Essential hypertension  Cirrhosis of liver, likely secondary to CHF or amiodarone induced.  Viral hepatitis panel negative  Plan: AKI: Patient is currently on albumin and  midodrine Consider addition of octreotide drip Nephrology is on board No further recommendations from GI standpoint at this time  Ascites: Total protein is <3 SAAG unknown to determine whether ascites is hepatic or nephrotic in origin  GI will sign off, please call us back with questions or concerns   LOS: 5 days   Desiree Koch 07/10/2019, 4:00 PM

## 2019-07-10 NOTE — Progress Notes (Signed)
[facility]  ROUNDING NOTE   Subjective:   Patient is doing fair Appetite remains poor; did not eat any breakfast this AM Abdomen is still distended continued on IV Lasix infusion and IV albumin supplementation 04/11 0701 - 04/12 0700 In: 120 [P.O.:120] Out: 850 [Urine:850]    Objective:  Vital signs in last 24 hours:  Temp:  [97.4 F (36.3 C)-98.6 F (37 C)] 98.6 F (37 C) (04/12 0843) Pulse Rate:  [33-94] 89 (04/12 0843) Resp:  [16-24] 16 (04/12 0843) BP: (100-128)/(45-56) 102/47 (04/12 0843) SpO2:  [97 %-100 %] 98 % (04/12 0843)  Weight change:  Filed Weights   07/05/19 0500 07/07/19 0420 07/09/19 0500  Weight: 70.1 kg 67.8 kg 66 kg    Intake/Output: I/O last 3 completed shifts: In: 120 [P.O.:120] Out: 950 [Urine:950]   Intake/Output this shift:  No intake/output data recorded.  Physical Exam: General: NAD,   Head: Normocephalic, atraumatic. Moist oral mucosal membranes  Eyes: Anicteric,  Neck: Supple,  Lungs:  Clear to auscultation  Heart: irregular  Abdomen:  +ascites  Extremities: trace peripheral edema.  Neurologic: Nonfocal, alert, able to answer questions appropriately  Skin: No lesions        Basic Metabolic Panel: Recent Labs  Lab 07/04/19 1736 07/04/19 2145 07/05/19 0500 07/05/19 1746 07/06/19 0548 07/06/19 0548 07/07/19 0446 07/07/19 0446 07/08/19 0541 07/09/19 0721 07/10/19 0611  NA 129*  --  131*  --  131*  --  129*  --  129* 133* 131*  K 6.3*   < > 5.2*   < > 5.0  --  4.9  --  5.3* 4.8 4.6  CL 98  --  99  --  100  --  99  --  99 99 97*  CO2 23  --  22  --  22  --  20*  --  22 26 24   GLUCOSE 104*  --  89  --  92  --  99  --  79 84 78  BUN 35*  --  36*  --  41*  --  43*  --  41* 44* 47*  CREATININE 2.61*  --  2.60*  --  2.81*  --  2.90*  --  3.02* 3.30* 3.71*  CALCIUM 9.8  --  9.4  --  9.1   < > 9.0   < > 8.8* 9.0 8.9  MG 2.2  --  2.1  --   --   --   --   --   --   --   --    < > = values in this interval not  displayed.    Liver Function Tests: Recent Labs  Lab 07/06/19 0548 07/07/19 0446 07/08/19 0541 07/09/19 0721 07/10/19 0611  AST 43* 43* 34 29 34  ALT 13 13 11 11 10   ALKPHOS 80 83 57 54 65  BILITOT 2.8* 2.9* 3.1* 2.6* 2.8*  PROT 5.6* 5.8* 5.6* 5.2* 5.6*  ALBUMIN 2.7* 2.8* 3.4* 3.4* 3.3*   Recent Labs  Lab 07/04/19 1736  LIPASE 75*   No results for input(s): AMMONIA in the last 168 hours.  CBC: Recent Labs  Lab 07/06/19 0548 07/07/19 0446 07/08/19 0541 07/09/19 0721 07/10/19 0611  WBC 7.9 8.2 6.2 7.2 8.6  HGB 9.8* 10.6* 8.9* 8.2* 9.7*  HCT 28.8* 30.5* 25.6* 24.2* 28.7*  MCV 106.7* 102.3* 103.2* 106.6* 106.3*  PLT 199 206 142* 143* 187    Cardiac Enzymes: No results for input(s): CKTOTAL, CKMB, CKMBINDEX,  TROPONINI in the last 168 hours.  BNP: Invalid input(s): POCBNP  CBG: Recent Labs  Lab 07/04/19 1842  GLUCAP 44    Microbiology: Results for orders placed or performed during the hospital encounter of 07/04/19  SARS CORONAVIRUS 2 (TAT 6-24 HRS) Nasopharyngeal Nasopharyngeal Swab     Status: None   Collection Time: 07/04/19  4:43 PM   Specimen: Nasopharyngeal Swab  Result Value Ref Range Status   SARS Coronavirus 2 NEGATIVE NEGATIVE Final    Comment: (NOTE) SARS-CoV-2 target nucleic acids are NOT DETECTED. The SARS-CoV-2 RNA is generally detectable in upper and lower respiratory specimens during the acute phase of infection. Negative results do not preclude SARS-CoV-2 infection, do not rule out co-infections with other pathogens, and should not be used as the sole basis for treatment or other patient management decisions. Negative results must be combined with clinical observations, patient history, and epidemiological information. The expected result is Negative. Fact Sheet for Patients: SugarRoll.be Fact Sheet for Healthcare Providers: https://www.woods-mathews.com/ This test is not yet approved or  cleared by the [place] FDA and  has been authorized for detection and/or diagnosis of SARS-CoV-2 by FDA under an Emergency Use Authorization (EUA). This EUA will remain  in effect (meaning this test can be used) for the duration of the COVID-19 declaration under Section 56 4(b)(1) of the Act, 21 U.S.C. section 360bbb-3(b)(1), unless the authorization is terminated or revoked sooner. Performed at [facility], [place] [address] [place], [place] [address]   Urine Culture     Status: Abnormal   Collection Time: 07/04/19  6:32 PM   Specimen: Urine, Clean Catch  Result Value Ref Range Status   Specimen Description   Final    URINE, CLEAN CATCH Performed at [facility], [address], [place], [place] [address]    Special Requests   Final    NONE Performed at [facility], [place] [address], [place], [place] [address]    Culture (A)  Final    80,000 COLONIES/mL MULTIPLE SPECIES PRESENT, SUGGEST RECOLLECTION   Report Status 07/06/2019 FINAL  Final    Coagulation Studies: No results for input(s): LABPROT, INR in the last 72 hours.  Urinalysis: No results for input(s): COLORURINE, LABSPEC, PHURINE, GLUCOSEU, HGBUR, BILIRUBINUR, KETONESUR, PROTEINUR, UROBILINOGEN, NITRITE, LEUKOCYTESUR in the last 72 hours.  Invalid input(s): APPERANCEUR    Imaging: DG Abd 1 View  Result Date: 07/08/2019 CLINICAL DATA:  Abdominal distension. EXAM: ABDOMEN - 1 VIEW COMPARISON:  None. FINDINGS: The bowel gas pattern is normal. Moderate amount of stool is seen throughout the large bowel. No radio-opaque calculi or other significant radiographic abnormality are seen. There is evidence of prior vertebroplasty at the level of L2 vertebral body. Radiopaque surgical clips are seen overlying the right upper quadrant. IMPRESSION: Moderate stool burden without evidence of bowel obstruction. Electronically Signed   By: Virgina Norfolk M.D.   On: 07/08/2019 22:59     Medications:   .  furosemide (LASIX) infusion 4 mg/hr (07/08/19 1505)   . docusate sodium  200 mg Oral BID  . heparin  5,000 Units Subcutaneous Q8H  . lactulose  30 g Oral BID  . levothyroxine  75 mcg Oral Daily  . metoprolol succinate  50 mg Oral Daily  . midodrine  10 mg Oral TID WC  . mirtazapine  15 mg Oral QHS  . pantoprazole  20 mg Oral Daily  . sodium bicarbonate  650 mg Oral BID   acetaminophen **OR** acetaminophen, ondansetron **  OR** ondansetron (ZOFRAN) IV  Assessment/ Plan:  [name] is a 86 y.o. white female withsystolic congestive heart failure, atrial fibrillation, hypertension, hypothyroidism, hepatic cirrhosis, coronary artery diseaseadmitted on 07/04/2019 for Hyperkalemia [E87.5] Ventricular ectopy [I49.3] Acute renal failure superimposed on chronic kidney disease, unspecified CKD stage, unspecified acute renal failure type ([place]) [N17.9, N18.9]  June 16, 2019: 2D echo: LVEF 20 to 25%, regional wall motion abnormalities, left ventricular internal cavity moderately dilated, mildly elevated pulmonary artery systolic pressure, dilated left and right atrium  1. Acute renal failure with hyperkalemia on chronic kidney disease stage IIIB:  #Chronic systolic congestive heart failure, pulmonary hypertension baseline creatinine of 1.4, GFR of 36 on 04/13/2019.  Chronic Kidney Disease most likely due to hypertensive nephrosclerosis Worsening of renal failure is likely secondary to abnormal cardiorenal hemodynamics Unfortunately patient serum creatinine is worse today at 3.02->3.3->3.7 Less likely hepatorenal syndrome.  With underlying cardiac arrhythmias and hypotension, patient is more likely to have ATN related renal insufficiency Continue midodrine in her regimen to maintain higher blood pressures.  Trial of IV furosemide to control edema and for relief of ascites  2. Cirrhosis with ascites: 4 liters.  Removed with paracentesis on April 9 Furosemide restarted as low-dose IV  infusion for sustained effect Continue to hold spironolactone due to hyperkalemia.  Overall prognosis appears poor.  Consider palliative care evaluation to clarify goals of care   patient's daughterJodie Cambell [phone]   LOS: 5 Duc Crocket 4/12/202110:18 AM

## 2019-07-10 NOTE — Progress Notes (Signed)
PT Cancellation Note  Patient Details Name: Desiree Koch MRN: [id number] DOB: 1930/06/09   Cancelled Treatment:    Reason Eval/Treat Not Completed: Other (comment). Patient treatment was imitated. Patient performed sit to stand with RW and patient was unaware of loose stool on herself and floor requiring clean up. Treatment session will be continued at another time.    [address], [place] DPT 07/10/2019, 2:41 PM

## 2019-07-11 ENCOUNTER — Inpatient Hospital Stay: Payer: Medicare HMO

## 2019-07-11 DIAGNOSIS — N179 Acute kidney failure, unspecified: Secondary | ICD-10-CM | POA: Diagnosis not present

## 2019-07-11 DIAGNOSIS — N189 Chronic kidney disease, unspecified: Secondary | ICD-10-CM | POA: Diagnosis not present

## 2019-07-11 DIAGNOSIS — I5022 Chronic systolic (congestive) heart failure: Secondary | ICD-10-CM | POA: Diagnosis not present

## 2019-07-11 DIAGNOSIS — K7469 Other cirrhosis of liver: Secondary | ICD-10-CM | POA: Diagnosis not present

## 2019-07-11 LAB — CBC
HCT: 28.4 % — ABNORMAL LOW (ref 36.0–46.0)
Hemoglobin: 9.7 g/dL — ABNORMAL LOW (ref 12.0–15.0)
MCH: 36.2 pg — ABNORMAL HIGH (ref 26.0–34.0)
MCHC: 34.2 g/dL (ref 30.0–36.0)
MCV: 106 fL — ABNORMAL HIGH (ref 80.0–100.0)
Platelets: 158 10*3/uL (ref 150–400)
RBC: 2.68 MIL/uL — ABNORMAL LOW (ref 3.87–5.11)
RDW: 16.8 % — ABNORMAL HIGH (ref 11.5–15.5)
WBC: 7.3 10*3/uL (ref 4.0–10.5)
nRBC: 0 % (ref 0.0–0.2)

## 2019-07-11 LAB — BASIC METABOLIC PANEL
Anion gap: 11 (ref 5–15)
BUN: 50 mg/dL — ABNORMAL HIGH (ref 8–23)
CO2: 25 mmol/L (ref 22–32)
Calcium: 9.3 mg/dL (ref 8.9–10.3)
Chloride: 99 mmol/L (ref 98–111)
Creatinine, Ser: 3.74 mg/dL — ABNORMAL HIGH (ref 0.44–1.00)
GFR calc Af Amer: 12 mL/min — ABNORMAL LOW (ref >60)
GFR calc non Af Amer: 10 mL/min — ABNORMAL LOW (ref >60)
Glucose, Bld: 80 mg/dL (ref 70–99)
Potassium: 4.6 mmol/L (ref 3.5–5.1)
Sodium: 135 mmol/L (ref 135–145)

## 2019-07-11 MED ORDER — ALBUMIN HUMAN 25 % IV SOLN
50.0000 g | Freq: Once | INTRAVENOUS | Status: DC
  Filled 2019-07-11: qty 200

## 2019-07-11 NOTE — Progress Notes (Signed)
PT Cancellation Note  Patient Details Name: Desiree Koch MRN: [id number] DOB: 18-May-1930   Cancelled Treatment:     PT attempt. PT hold. Pt very lethargic and unable to safely participate in session. Therapist discussed with RN and will return next date when pt is more appropriate.    Willette Pa 07/11/2019, 3:15 PM

## 2019-07-11 NOTE — Plan of Care (Signed)
Asked patient if she would like to sit in the chair today. Patient stated she just didn't feel well today and wanted to stay in bed.   Fuller Mandril, RN

## 2019-07-11 NOTE — Progress Notes (Signed)
PROGRESS NOTE    Desiree Koch  [id number] DOB: [birth year] DOA: 07/04/2019 PCP: Adin Hector, MD   HPI was taken from Dr. Zada Finders: Desiree Koch is a 86 y.o. female with medical history significant for paroxysmal A. fib/flutter not on anticoagulation due to history of bleeding, CAD, CKD stage III, chronic systolic CHF (EF 41-28% by TTE 06/16/2019), LBBB, hepatic cirrhosis, hypertension, hypothyroidism who presents to the ED for evaluation of generalized weakness.  Patient was recently hospitalized from 06/15/2019-06/20/2019 for acute on chronic CHF exacerbation and decompensated cirrhosis with ascites.  She was diuresed with IV Lasix and underwent paracentesis on 06/16/2019 with removal of 3 L peritoneal fluid.  Ascites fluid did not show sign of infection or malignant cells.  She was discharged on diuretic therapy of Lasix 40 mg daily and Aldactone 12.5 mg daily.  She was also started on digoxin for her paroxysmal atrial fibrillation.  She was seen in follow-up by her PCP on 06/26/2019.  She was noted to be rapidly accumulating fluid.  Her Lasix was increased to 80 mg daily.  She underwent repeat ultrasound paracentesis on 06/27/2019 with removal of 4.0 L of peritoneal fluid.  Patient states that she has been feeling overall weak from the last couple days particularly with muscle weakness as well as cramping sensation in both of her calves.  She normally walks with the use of a cane but has been having difficulty doing this.  She has not fallen and denies any loss of consciousness.  She states she has had some increased swelling in her abdomen but not as much as prior to her most recent paracentesis.  She does report some shortness of breath the last couple days.  She denies any chest pain, palpitations, abdominal pain, dysuria, nausea, vomiting, or diarrhea.   ED Course:  Initial vitals showed BP 123/97, pulse 114, RR 16, temp 98.2 Fahrenheit, SPO2 9 9% room air.  Labs are  notable for potassium 6.3, sodium 129, bicarb 23, BUN 35, creatinine 2.61 (creatinine 1.59 on 06/20/2019), AST 50, ALT 15, alk phos 87, total bilirubin 3.1, WBC 8.7, hemoglobin 11.8, platelets 214,000, BNP 792, lipase 75, magnesium 2.2.  Urinalysis showed negative nitrites, large leukocytes, 6-10 RBC/hpf, 21-50 WBC/hpf, rare bacteria microscopy.  SARS-CoV-2 PCR test is obtained and pending.  2 view chest x-ray shows cardiomegaly, stable blunting of the right costophrenic angle, and no evidence of acute infiltrate.  Patient was given IV calcium gluconate 1 g, Kayexalate, 250 cc normal saline.  EDP discussed case with on-call nephrology who recommended also given insulin with D50 and 1 amp of bicarbonate.  The hospitalist service was consulted to admit for further evaluation and management.   Assessment & Plan:   Principal Problem:   Hyperkalemia Active Problems:   Acute kidney injury superimposed on chronic kidney disease (HCC)   AF (paroxysmal atrial fibrillation) (HCC)   Hypothyroidism   CAD (coronary artery disease)   Chronic systolic CHF (congestive heart failure) (HCC)   Other cirrhosis of liver (HCC)   Essential hypertension   Hyperkalemia: in setting of spironolactone and potassium supplementation use & AKI.  EKG has new frequent PVCs & nonsustained runs of VT. S/p Calcium gluconate, bicarb & lokelma 07/08/19.  Continue to hold spironolactone. Continue on tele. Labile, currently WNL today. Nephro following and recs apprec  AKI on CKD stage III: Cr continues to trend up. Likely secondary to ATN & possible cardiorenal and/or hepatorenal syndrome. Less likely hepatorenal as per GI & nephro. Continue  to hold spironolactone. Monitor I/Os. Nephro following and recs apprec  Cirrhosis: had recent paracentesis on 06/27/2019 with removal of 4 L peritoneal fluid. S/p paracentesis 07/07/19 w/ 4L and albumin given after. Continue on lasix drip as per nephro. Continue to hold spironolactone.  Continue supportive care as per GI. MELD score at least 28. Pt's daughter requested to speak w/ palliative care yesterday afternoon. Palliative care consulted 07/10/19  Paroxysmal atrial fibrillation/flutter: rate controlled and frequent PVCs.  Not on anticoagulation due to prior history of bleeding. Restarted metoprolol but a reduced dose secondary to low end of normal BP. Recently started on digoxin   Chronic systolic CHF: EF 13-08% by TTE 06/16/2019. W/ likely cardiorenal and/or hepatorenal syndrome. Less likely hepatorenal as per GI & nephro. Will continue on lasix drip as per nephro.   Poor po intake:continue on mirtazapine   Thrombocytopenia: resolved  Transaminitis: resolved  Hyponatremia: likely secondary to cirrhosis. Resolved  Macrocytic anemia: likely secondary to cirrhosis. No need for a transfusion at this time   Hypertension: continue to hold metoprolol due to low normal blood pressure. Continue on midodrine as per nephro   CAD: chronic and stable.  Denies any chest pain.   Hypothyroidism: continue synthroid.   DVT prophylaxis: heparin SQ Code Status: full  Family Communication: discussed pt's care with pt's daughter, [name], and answered her questions Disposition Plan: will likely d/c w/ home health. Home health orders will be placed. Unlikely no barriers to d/c. Palliative care was consulted 07/10/19 and awaiting on discussion conclusions after discussing w/ pt's daughter and pt     Consultants:   nepho  GI  Palliative care    Procedures:    Antimicrobials:    Subjective: Pt c/o abd distention still and malaise.   Objective: Vitals:   07/10/19 2000 07/11/19 0000 07/11/19 0441 07/11/19 0451  BP: (!) 128/43  (!) 113/39   Pulse: (!) 40 (!) 105 (!) 47 84  Resp: 20  20   Temp: (!) 97.5 F (36.4 C)  98.3 F (36.8 C)   TempSrc: Oral  Oral   SpO2: 99%  99%   Weight:   65.1 kg   Height:        Intake/Output Summary (Last 24 hours) at 07/11/2019  0748 Last data filed at 07/11/2019 0704 Gross per 24 hour  Intake 420.71 ml  Output 700 ml  Net -279.29 ml   Filed Weights   07/07/19 0420 07/09/19 0500 07/11/19 0441  Weight: 67.8 kg 66 kg 65.1 kg    Examination:  General exam: Appears calm and comfortable  Respiratory system: Clear to auscultation. No rales Cardiovascular system: irregularly irregular. No rubs, gallops or clicks.  Gastrointestinal system: Abdomen is distended, soft and nontender. Normal bowel sounds heard. Central nervous system: Alert and oriented. Moves all 4 extremities Psychiatry: Judgement and insight appear abnormal. Flat mood and affect    Data Reviewed: I have personally reviewed following labs and imaging studies  CBC: Recent Labs  Lab 07/07/19 0446 07/08/19 0541 07/09/19 0721 07/10/19 0611 07/11/19 0521  WBC 8.2 6.2 7.2 8.6 7.3  HGB 10.6* 8.9* 8.2* 9.7* 9.7*  HCT 30.5* 25.6* 24.2* 28.7* 28.4*  MCV 102.3* 103.2* 106.6* 106.3* 106.0*  PLT 206 142* 143* 187 657   Basic Metabolic Panel: Recent Labs  Lab 07/04/19 1736 07/04/19 2145 07/05/19 0500 07/05/19 1746 07/07/19 0446 07/08/19 0541 07/09/19 0721 07/10/19 0611 07/11/19 0521  NA 129*  --  131*   < > 129* 129* 133* 131* 135  K  6.3*   < > 5.2*   < > 4.9 5.3* 4.8 4.6 4.6  CL 98  --  99   < > 99 99 99 97* 99  CO2 23  --  22   < > 20* _0 GLUCOSE 104*  --  89   < > 99 79 84 78 80  BUN 35*  --  36*   < > 43* 41* 44* 47* 50*  CREATININE 2.61*  --  2.60*   < > 2.90* 3.02* 3.30* 3.71* 3.74*  CALCIUM 9.8  --  9.4   < > 9.0 8.8* 9.0 8.9 9.3  MG 2.2  --  2.1  --   --   --   --   --   --    < > = values in this interval not displayed.   GFR: Estimated Creatinine Clearance: 9.3 mL/min (A) (by C-G formula based on SCr of 3.74 mg/dL (H)). Liver Function Tests: Recent Labs  Lab 07/06/19 0548 07/07/19 0446 07/08/19 0541 07/09/19 0721 07/10/19 0611  AST 43* 43* 34 29 34  ALT _1 ALKPHOS 80 83 57 54 65  BILITOT 2.8*  2.9* 3.1* 2.6* 2.8*  PROT 5.6* 5.8* 5.6* 5.2* 5.6*  ALBUMIN 2.7* 2.8* 3.4* 3.4* 3.3*   Recent Labs  Lab 07/04/19 1736  LIPASE 75*   No results for input(s): AMMONIA in the last 168 hours. Coagulation Profile: No results for input(s): INR, PROTIME in the last 168 hours. Cardiac Enzymes: No results for input(s): CKTOTAL, CKMB, CKMBINDEX, TROPONINI in the last 168 hours. BNP (last 3 results) No results for input(s): PROBNP in the last 8760 hours. HbA1C: No results for input(s): HGBA1C in the last 72 hours. CBG: Recent Labs  Lab 07/04/19 1842  GLUCAP 81   Lipid Profile: No results for input(s): CHOL, HDL, LDLCALC, TRIG, CHOLHDL, LDLDIRECT in the last 72 hours. Thyroid Function Tests: No results for input(s): TSH, T4TOTAL, FREET4, T3FREE, THYROIDAB in the last 72 hours. Anemia Panel: No results for input(s): VITAMINB12, FOLATE, FERRITIN, TIBC, IRON, RETICCTPCT in the last 72 hours. Sepsis Labs: No results for input(s): PROCALCITON, LATICACIDVEN in the last 168 hours.  Recent Results (from the past 240 hour(s))  SARS CORONAVIRUS 2 (TAT 6-24 HRS) Nasopharyngeal Nasopharyngeal Swab     Status: None   Collection Time: 07/04/19  4:43 PM   Specimen: Nasopharyngeal Swab  Result Value Ref Range Status   SARS Coronavirus 2 NEGATIVE NEGATIVE Final    Comment: (NOTE) SARS-CoV-2 target nucleic acids are NOT DETECTED. The SARS-CoV-2 RNA is generally detectable in upper and lower respiratory specimens during the acute phase of infection. Negative results do not preclude SARS-CoV-2 infection, do not rule out co-infections with other pathogens, and should not be used as the sole basis for treatment or other patient management decisions. Negative results must be combined with clinical observations, patient history, and epidemiological information. The expected result is Negative. Fact Sheet for Patients: SugarRoll.be Fact Sheet for Healthcare  Providers: https://www.woods-mathews.com/ This test is not yet approved or cleared by the [place] FDA and  has been authorized for detection and/or diagnosis of SARS-CoV-2 by FDA under an Emergency Use Authorization (EUA). This EUA will remain  in effect (meaning this test can be used) for the duration of the COVID-19 declaration under Section 56 4(b)(1) of the Act, 21 U.S.C. section 360bbb-3(b)(1), unless the authorization is terminated or revoked sooner. Performed at [facility], [place]  [address] [place], [place] [address]   Urine Culture     Status: Abnormal   Collection Time: 07/04/19  6:32 PM   Specimen: Urine, Clean Catch  Result Value Ref Range Status   Specimen Description   Final    URINE, CLEAN CATCH Performed at [facility], [address] [place], [place] [address]    Special Requests   Final    NONE Performed at [facility], [place] [address], [place], [place] [address]    Culture (A)  Final    80,000 COLONIES/mL MULTIPLE SPECIES PRESENT, SUGGEST RECOLLECTION   Report Status 07/06/2019 FINAL  Final         Radiology Studies: No results found.      Scheduled Meds: . docusate sodium  200 mg Oral BID  . heparin  5,000 Units Subcutaneous Q8H  . lactulose  30 g Oral BID  . levothyroxine  75 mcg Oral Daily  . metoprolol succinate  50 mg Oral Daily  . midodrine  10 mg Oral TID WC  . mirtazapine  15 mg Oral QHS  . pantoprazole  20 mg Oral Daily  . sodium bicarbonate  650 mg Oral BID   Continuous Infusions: . albumin human Stopped (07/10/19 2140)  . furosemide (LASIX) infusion 4 mg/hr (07/11/19 0300)     LOS: 6 days    Time spent: 36 mins    Wyvonnia Dusky, MD [facility] Pager [phone]  If 7PM-7AM, please contact night-coverage [email or web address] 07/11/2019, 7:48 AM

## 2019-07-11 NOTE — Progress Notes (Signed)
OT Cancellation Note  Patient Details Name: Desiree Koch MRN: [id number] DOB: Jul 07, 1930   Cancelled Treatment:    Reason Eval/Treat Not Completed: Other (comment)  Pt very lethargic when OT presents for treatment this date. OT attempts to encourage pt to participate on some level; whether EOB sitting, ADL transfer practice, bed level therex, bed level self care ADLs. Pt with minimal eye opening/visual attention and conversation with OT despite being very appropriate last week on evaluation. OT discussed with RN. Will f/u at later date/time as able. Thank you.  Gerrianne Scale, [place], OTR/L ascom [phone] 07/11/19, 2:35 PM

## 2019-07-11 NOTE — TOC Progression Note (Signed)
Transition of Care [facility]) - Progression Note    Patient Details  Name: Desiree Koch MRN: [id number] Date of Birth: 09-01-1930  Transition of Care [facility]) CM/SW [place], LCSW Phone Number: 07/11/2019, 9:21 AM  Clinical Narrative: Readmission prevention screen complete. CSW met with patient. No supports at bedside. CSW introduced role and explained that discharge planning would be discussed. Patient was active with [facility] prior to admission. She was not using DME prior to admission but thinks she may need some at discharge. PCP is Dr. Ramonita Lab. Pharmacy is Paediatric nurse on [address]. No issues obtaining medications. Patient stated she was driving prior to admission but doesn't think she will be able to for a while once she is discharged. No further concerns. CSW encouraged patient to contact CSW as needed. CSW will continue to follow patient for support and facilitate return home when stable.    Expected Discharge Plan: [place] Barriers to Discharge: Continued Medical Work up  Expected Discharge Plan and Services Expected Discharge Plan: [place] In-house Referral: Clinical Social Work                                             Social Determinants of Health (SDOH) Interventions    Readmission Risk Interventions Readmission Risk Prevention Plan 07/11/2019  Transportation Screening Complete  PCP or Specialist Appt within 3-5 Days Complete  HRI or [place] Complete  Social Work Consult for [place] Planning/Counseling Complete  Palliative Care Screening Complete  Medication Review [employer] photographer) Complete  Some recent data might be hidden

## 2019-07-11 NOTE — Progress Notes (Signed)
[facility]  ROUNDING NOTE   Subjective:   Patient is doing fair Appetite remains poor;   Abdomen is still distended continued on IV Lasix infusion and IV albumin supplementation 04/12 0701 - 04/13 0700 In: 420.7 [P.O.:120; I.V.:222.9; IV Piggyback:77.8] Out: 500 [Urine:500]    Objective:  Vital signs in last 24 hours:  Temp:  [97.5 F (36.4 C)-98.4 F (36.9 C)] 98.4 F (36.9 C) (04/13 1208) Pulse Rate:  [40-105] 60 (04/13 1208) Resp:  [16-20] 16 (04/13 1208) BP: (113-128)/(37-43) 120/37 (04/13 1208) SpO2:  [98 %-99 %] 98 % (04/13 1208) Weight:  [65.1 kg] 65.1 kg (04/13 0441)  Weight change:  Filed Weights   07/07/19 0420 07/09/19 0500 07/11/19 0441  Weight: 67.8 kg 66 kg 65.1 kg    Intake/Output: I/O last 3 completed shifts: In: 420.7 [P.O.:120; I.V.:222.9; IV Piggyback:77.8] Out: 900 [Urine:900]   Intake/Output this shift:  Total I/O In: 324 [P.O.:300; I.V.:24] Out: 325 [Urine:325]  Physical Exam: General: NAD,   Head: Normocephalic, atraumatic. Moist oral mucosal membranes  Eyes: Anicteric,  Neck: Supple,  Lungs:  Clear to auscultation  Heart: irregular  Abdomen:  +ascites  Extremities: trace peripheral edema.  Neurologic: Nonfocal, alert, able to answer questions appropriately  Skin: No lesions        Basic Metabolic Panel: Recent Labs  Lab 07/04/19 1736 07/04/19 2145 07/05/19 0500 07/05/19 1746 07/07/19 0446 07/07/19 0446 07/08/19 0541 07/08/19 0541 07/09/19 0721 07/10/19 0611 07/11/19 0521  NA 129*  --  131*   < > 129*  --  129*  --  133* 131* 135  K 6.3*   < > 5.2*   < > 4.9  --  5.3*  --  4.8 4.6 4.6  CL 98  --  99   < > 99  --  99  --  99 97* 99  CO2 23  --  22   < > 20*  --  22  --  26 24 25   GLUCOSE 104*  --  89   < > 99  --  79  --  84 78 80  BUN 35*  --  36*   < > 43*  --  41*  --  44* 47* 50*  CREATININE 2.61*  --  2.60*   < > 2.90*  --  3.02*  --  3.30* 3.71* 3.74*  CALCIUM 9.8  --  9.4   < > 9.0   < > 8.8*   <  > 9.0 8.9 9.3  MG 2.2  --  2.1  --   --   --   --   --   --   --   --    < > = values in this interval not displayed.    Liver Function Tests: Recent Labs  Lab 07/06/19 0548 07/07/19 0446 07/08/19 0541 07/09/19 0721 07/10/19 0611  AST 43* 43* 34 29 34  ALT 13 13 11 11 10   ALKPHOS 80 83 57 54 65  BILITOT 2.8* 2.9* 3.1* 2.6* 2.8*  PROT 5.6* 5.8* 5.6* 5.2* 5.6*  ALBUMIN 2.7* 2.8* 3.4* 3.4* 3.3*   Recent Labs  Lab 07/04/19 1736  LIPASE 75*   No results for input(s): AMMONIA in the last 168 hours.  CBC: Recent Labs  Lab 07/07/19 0446 07/08/19 0541 07/09/19 0721 07/10/19 0611 07/11/19 0521  WBC 8.2 6.2 7.2 8.6 7.3  HGB 10.6* 8.9* 8.2* 9.7* 9.7*  HCT 30.5* 25.6* 24.2* 28.7* 28.4*  MCV 102.3* 103.2* 106.6*  106.3* 106.0*  PLT 206 142* 143* 187 158    Cardiac Enzymes: No results for input(s): CKTOTAL, CKMB, CKMBINDEX, TROPONINI in the last 168 hours.  BNP: Invalid input(s): POCBNP  CBG: Recent Labs  Lab 07/04/19 1842  GLUCAP 17    Microbiology: Results for orders placed or performed during the hospital encounter of 07/04/19  SARS CORONAVIRUS 2 (TAT 6-24 HRS) Nasopharyngeal Nasopharyngeal Swab     Status: None   Collection Time: 07/04/19  4:43 PM   Specimen: Nasopharyngeal Swab  Result Value Ref Range Status   SARS Coronavirus 2 NEGATIVE NEGATIVE Final    Comment: (NOTE) SARS-CoV-2 target nucleic acids are NOT DETECTED. The SARS-CoV-2 RNA is generally detectable in upper and lower respiratory specimens during the acute phase of infection. Negative results do not preclude SARS-CoV-2 infection, do not rule out co-infections with other pathogens, and should not be used as the sole basis for treatment or other patient management decisions. Negative results must be combined with clinical observations, patient history, and epidemiological information. The expected result is Negative. Fact Sheet for Patients: SugarRoll.be Fact Sheet  for Healthcare Providers: https://www.woods-mathews.com/ This test is not yet approved or cleared by the [place] FDA and  has been authorized for detection and/or diagnosis of SARS-CoV-2 by FDA under an Emergency Use Authorization (EUA). This EUA will remain  in effect (meaning this test can be used) for the duration of the COVID-19 declaration under Section 56 4(b)(1) of the Act, 21 U.S.C. section 360bbb-3(b)(1), unless the authorization is terminated or revoked sooner. Performed at [facility], [place] [address] [place], [place] [address]   Urine Culture     Status: Abnormal   Collection Time: 07/04/19  6:32 PM   Specimen: Urine, Clean Catch  Result Value Ref Range Status   Specimen Description   Final    URINE, CLEAN CATCH Performed at [facility], [address] [place], [place] [address]    Special Requests   Final    NONE Performed at [facility], [place] [address], [place], [place] [address]    Culture (A)  Final    80,000 COLONIES/mL MULTIPLE SPECIES PRESENT, SUGGEST RECOLLECTION   Report Status 07/06/2019 FINAL  Final    Coagulation Studies: No results for input(s): LABPROT, INR in the last 72 hours.  Urinalysis: No results for input(s): COLORURINE, LABSPEC, PHURINE, GLUCOSEU, HGBUR, BILIRUBINUR, KETONESUR, PROTEINUR, UROBILINOGEN, NITRITE, LEUKOCYTESUR in the last 72 hours.  Invalid input(s): APPERANCEUR    Imaging: No results found.   Medications:   . albumin human 12.5 g (07/11/19 0918)  . furosemide (LASIX) infusion 4 mg/hr (07/11/19 1110)   . docusate sodium  200 mg Oral BID  . heparin  5,000 Units Subcutaneous Q8H  . lactulose  30 g Oral BID  . levothyroxine  75 mcg Oral Daily  . metoprolol succinate  50 mg Oral Daily  . midodrine  10 mg Oral TID WC  . mirtazapine  15 mg Oral QHS  . pantoprazole  20 mg Oral Daily  . sodium bicarbonate  650 mg Oral BID   acetaminophen **OR** acetaminophen, ondansetron **OR**  ondansetron (ZOFRAN) IV  Assessment/ Plan:  [name] is a 86 y.o. white female withsystolic congestive heart failure, atrial fibrillation, hypertension, hypothyroidism, hepatic cirrhosis, coronary artery diseaseadmitted on 07/04/2019 for Hyperkalemia [E87.5] Ventricular ectopy [I49.3] Acute renal failure superimposed on chronic kidney disease, unspecified CKD stage, unspecified acute renal failure type ([place]) [N17.9, N18.9]  June 16, 2019: 2D echo:  LVEF 20 to 25%, regional wall motion abnormalities, left ventricular internal cavity moderately dilated, mildly elevated pulmonary artery systolic pressure, dilated left and right atrium  1. Acute renal failure with hyperkalemia on chronic kidney disease stage IIIB:  #Chronic systolic congestive heart failure, pulmonary hypertension baseline creatinine of 1.4, GFR of 36 on 04/13/2019.  Chronic Kidney Disease most likely due to hypertensive nephrosclerosis Worsening of renal failure is likely secondary to abnormal cardiorenal hemodynamics   Lab Results  Component Value Date   CREATININE 3.74 (H) 07/11/2019   CREATININE 3.71 (H) 07/10/2019   CREATININE 3.30 (H) 07/09/2019    Less likely hepatorenal syndrome.  With underlying cardiac arrhythmias and hypotension, patient is more likely to have ATN related renal insufficiency Continue midodrine in her regimen to maintain higher blood pressures.  Trial of IV furosemide to control edema and for relief of ascites Serum creatinine appears to be stabilizing.  Hopefully we will see some improvement in the next few days.  2. Cirrhosis with ascites: 4 liters.  Removed with paracentesis on April 9 Furosemide restarted as low-dose IV infusion for sustained effect Continue to hold spironolactone due to hyperkalemia.  Overall prognosis appears poor.      patient's daughterJodie Koch [phone]   LOS: 6 Desiree Koch 4/13/20212:29 PM

## 2019-07-11 NOTE — Progress Notes (Signed)
   07/11/19 1500  Clinical Encounter Type  Visited With Patient  Visit Type Follow-up  Referral From Chaplain  Consult/Referral To Chaplain  When Chaplain entered the room, Mrs. Braz said she was tingled up, (in phone and other cords), patient was trying to get off the bed to use the bathroom. Chaplain went to nurses' station and spoke with NT. Patient's NT came in shortly thereafter. When Chaplain went back, she noticed patient was still using the bathroom, therefore she left.

## 2019-07-12 ENCOUNTER — Inpatient Hospital Stay: Payer: Medicare HMO

## 2019-07-12 DIAGNOSIS — E875 Hyperkalemia: Secondary | ICD-10-CM | POA: Diagnosis not present

## 2019-07-12 DIAGNOSIS — R140 Abdominal distension (gaseous): Secondary | ICD-10-CM

## 2019-07-12 DIAGNOSIS — I480 Paroxysmal atrial fibrillation: Secondary | ICD-10-CM

## 2019-07-12 DIAGNOSIS — Z7189 Other specified counseling: Secondary | ICD-10-CM

## 2019-07-12 DIAGNOSIS — E039 Hypothyroidism, unspecified: Secondary | ICD-10-CM

## 2019-07-12 DIAGNOSIS — N179 Acute kidney failure, unspecified: Secondary | ICD-10-CM | POA: Diagnosis not present

## 2019-07-12 DIAGNOSIS — Z515 Encounter for palliative care: Secondary | ICD-10-CM

## 2019-07-12 DIAGNOSIS — R188 Other ascites: Secondary | ICD-10-CM

## 2019-07-12 DIAGNOSIS — I10 Essential (primary) hypertension: Secondary | ICD-10-CM

## 2019-07-12 LAB — COMPREHENSIVE METABOLIC PANEL
ALT: 11 U/L (ref 0–44)
AST: 32 U/L (ref 15–41)
Albumin: 3.4 g/dL — ABNORMAL LOW (ref 3.5–5.0)
Alkaline Phosphatase: 59 U/L (ref 38–126)
Anion gap: 11 (ref 5–15)
BUN: 48 mg/dL — ABNORMAL HIGH (ref 8–23)
CO2: 26 mmol/L (ref 22–32)
Calcium: 9.3 mg/dL (ref 8.9–10.3)
Chloride: 98 mmol/L (ref 98–111)
Creatinine, Ser: 3.71 mg/dL — ABNORMAL HIGH (ref 0.44–1.00)
GFR calc Af Amer: 12 mL/min — ABNORMAL LOW (ref >60)
GFR calc non Af Amer: 10 mL/min — ABNORMAL LOW (ref >60)
Glucose, Bld: 104 mg/dL — ABNORMAL HIGH (ref 70–99)
Potassium: 4 mmol/L (ref 3.5–5.1)
Sodium: 135 mmol/L (ref 135–145)
Total Bilirubin: 2.7 mg/dL — ABNORMAL HIGH (ref 0.3–1.2)
Total Protein: 5.6 g/dL — ABNORMAL LOW (ref 6.5–8.1)

## 2019-07-12 LAB — CBC
HCT: 29.2 % — ABNORMAL LOW (ref 36.0–46.0)
Hemoglobin: 9.9 g/dL — ABNORMAL LOW (ref 12.0–15.0)
MCH: 35.9 pg — ABNORMAL HIGH (ref 26.0–34.0)
MCHC: 33.9 g/dL (ref 30.0–36.0)
MCV: 105.8 fL — ABNORMAL HIGH (ref 80.0–100.0)
Platelets: 153 10*3/uL (ref 150–400)
RBC: 2.76 MIL/uL — ABNORMAL LOW (ref 3.87–5.11)
RDW: 16.5 % — ABNORMAL HIGH (ref 11.5–15.5)
WBC: 8.3 10*3/uL (ref 4.0–10.5)
nRBC: 0 % (ref 0.0–0.2)

## 2019-07-12 MED ORDER — NEPRO/CARBSTEADY PO LIQD
237.0000 mL | Freq: Two times a day (BID) | ORAL | Status: DC
  Administered 2019-07-12 – 2019-07-13 (×2): 237 mL via ORAL

## 2019-07-12 NOTE — Progress Notes (Signed)
PROGRESS NOTE    Desiree Koch   [id number]  DOB: [birth year]  DOA: 07/04/2019 PCP: Adin Hector, MD   Brief Narrative:  Desiree Koch is a 86 y.o.femalewith medical history significant forparoxysmal A. fib/flutter not on anticoagulation due to history of bleeding, CAD, CKD stage III, chronic systolic CHF (EF 0000000 by TTE 06/16/2019), LBBB, hepatic cirrhosis, hypertension, hypothyroidism who presents to the ED for evaluation of generalized weakness. In ED> K 6.3, sodium 129, CR up to 2.61 from 1.59  Subjective: No complaints today.     Assessment & Plan:   Principal Problem:   Hyperkalemia - in setting of Spironolactone use which is now on hold- Hyperkalemia resolved  Active Problems:   Acute kidney injury superimposed on chronic kidney disease -  Chronic systolic CHF  -   With EF of 20-25% - appreciate nephrology follow up - currently on Lasix    AF (paroxysmal atrial fibrillation)  - cont Metoprolol - not on anticoagulation due to bleeding issues in the past     Other cirrhosis of liver with ascites - removal of about 3 L of ascitic fluid today by IR     Time spent in minutes: 35 DVT prophylaxis: Heparin Code Status: Full code Family Communication:  Disposition Plan: home with home health- having palliative care conversations- still fluid overloaded and not ready for d/c Consultants:   Nephrology  Palliative care  IR Procedures:   Paracentesis 4/14 Antimicrobials:  Anti-infectives (From admission, onward)   None       Objective: Vitals:   07/12/19 1250 07/12/19 1313 07/12/19 1331 07/12/19 1358  BP: (!) 130/55 (!) 134/57 (!) 147/57 (!) 122/49  Pulse: (!) 121 81 82 (!) 58  Resp:    16  Temp:      TempSrc:      SpO2: 100% 97% 97% 98%  Weight:      Height:       No intake or output data in the 24 hours ending 07/12/19 1732 Filed Weights   07/07/19 0420 07/09/19 0500 07/11/19 0441  Weight: 67.8 kg 66 kg 65.1 kg     Examination: General exam: Appears comfortable  HEENT: PERRLA, oral mucosa moist, no sclera icterus or thrush Respiratory system: Clear to auscultation. Respiratory effort normal. Cardiovascular system: S1 & S2 heard, RRR.   Gastrointestinal system: Abdomen soft, non-tender, very distended. Normal bowel sounds. Central nervous system: Alert and oriented. No focal neurological deficits. Extremities: No cyanosis, clubbing or edema Skin: No rashes or ulcers Psychiatry:  Mood & affect appropriate.     Data Reviewed: I have personally reviewed following labs and imaging studies  CBC: Recent Labs  Lab 07/08/19 0541 07/09/19 0721 07/10/19 0611 07/11/19 0521 07/12/19 0531  WBC 6.2 7.2 8.6 7.3 8.3  HGB 8.9* 8.2* 9.7* 9.7* 9.9*  HCT 25.6* 24.2* 28.7* 28.4* 29.2*  MCV 103.2* 106.6* 106.3* 106.0* 105.8*  PLT 142* 143* 187 158 0000000   Basic Metabolic Panel: Recent Labs  Lab 07/08/19 0541 07/09/19 0721 07/10/19 0611 07/11/19 0521 07/12/19 0531  NA 129* 133* 131* 135 135  K 5.3* 4.8 4.6 4.6 4.0  CL 99 99 97* 99 98  CO2 22 26 24 25 26   GLUCOSE 79 84 78 80 104*  BUN 41* 44* 47* 50* 48*  CREATININE 3.02* 3.30* 3.71* 3.74* 3.71*  CALCIUM 8.8* 9.0 8.9 9.3 9.3   GFR: Estimated Creatinine Clearance: 9.4 mL/min (A) (by C-G formula based on SCr of 3.71 mg/dL (H)). Liver Function Tests:  Recent Labs  Lab 07/07/19 0446 07/08/19 0541 07/09/19 0721 07/10/19 0611 07/12/19 0531  AST 43* 34 29 34 32  ALT 13 11 11 10 11   ALKPHOS 83 57 54 65 59  BILITOT 2.9* 3.1* 2.6* 2.8* 2.7*  PROT 5.8* 5.6* 5.2* 5.6* 5.6*  ALBUMIN 2.8* 3.4* 3.4* 3.3* 3.4*   No results for input(s): LIPASE, AMYLASE in the last 168 hours. No results for input(s): AMMONIA in the last 168 hours. Coagulation Profile: No results for input(s): INR, PROTIME in the last 168 hours. Cardiac Enzymes: No results for input(s): CKTOTAL, CKMB, CKMBINDEX, TROPONINI in the last 168 hours. BNP (last 3 results) No results for  input(s): PROBNP in the last 8760 hours. HbA1C: No results for input(s): HGBA1C in the last 72 hours. CBG: No results for input(s): GLUCAP in the last 168 hours. Lipid Profile: No results for input(s): CHOL, HDL, LDLCALC, TRIG, CHOLHDL, LDLDIRECT in the last 72 hours. Thyroid Function Tests: No results for input(s): TSH, T4TOTAL, FREET4, T3FREE, THYROIDAB in the last 72 hours. Anemia Panel: No results for input(s): VITAMINB12, FOLATE, FERRITIN, TIBC, IRON, RETICCTPCT in the last 72 hours. Urine analysis:    Component Value Date/Time   COLORURINE AMBER (A) 07/04/2019 1832   APPEARANCEUR CLOUDY (A) 07/04/2019 1832   LABSPEC 1.016 07/04/2019 1832   PHURINE 5.0 07/04/2019 1832   GLUCOSEU NEGATIVE 07/04/2019 1832   HGBUR NEGATIVE 07/04/2019 1832   BILIRUBINUR NEGATIVE 07/04/2019 1832   KETONESUR NEGATIVE 07/04/2019 1832   PROTEINUR NEGATIVE 07/04/2019 1832   NITRITE NEGATIVE 07/04/2019 1832   LEUKOCYTESUR LARGE (A) 07/04/2019 1832   Sepsis Labs: @LABRCNTIP (procalcitonin:4,lacticidven:4) ) Recent Results (from the past 240 hour(s))  SARS CORONAVIRUS 2 (TAT 6-24 HRS) Nasopharyngeal Nasopharyngeal Swab     Status: None   Collection Time: 07/04/19  4:43 PM   Specimen: Nasopharyngeal Swab  Result Value Ref Range Status   SARS Coronavirus 2 NEGATIVE NEGATIVE Final    Comment: (NOTE) SARS-CoV-2 target nucleic acids are NOT DETECTED. The SARS-CoV-2 RNA is generally detectable in upper and lower respiratory specimens during the acute phase of infection. Negative results do not preclude SARS-CoV-2 infection, do not rule out co-infections with other pathogens, and should not be used as the sole basis for treatment or other patient management decisions. Negative results must be combined with clinical observations, patient history, and epidemiological information. The expected result is Negative. Fact Sheet for Patients: SugarRoll.be Fact Sheet for Healthcare  Providers: https://www.woods-mathews.com/ This test is not yet approved or cleared by the [place] FDA and  has been authorized for detection and/or diagnosis of SARS-CoV-2 by FDA under an Emergency Use Authorization (EUA). This EUA will remain  in effect (meaning this test can be used) for the duration of the COVID-19 declaration under Section 56 4(b)(1) of the Act, 21 U.S.C. section 360bbb-3(b)(1), unless the authorization is terminated or revoked sooner. Performed at [facility], [place] [address] [place], [place] [address]   Urine Culture     Status: Abnormal   Collection Time: 07/04/19  6:32 PM   Specimen: Urine, Clean Catch  Result Value Ref Range Status   Specimen Description   Final    URINE, CLEAN CATCH Performed at [facility], [address], [place], [place] [address]    Special Requests   Final    NONE Performed at [facility], [place] [address] [place], [place] [address]    Culture (A)  Final    80,000 COLONIES/mL MULTIPLE SPECIES PRESENT, SUGGEST RECOLLECTION  Report Status 07/06/2019 FINAL  Final         Radiology Studies: DG Abd 1 View  Result Date: 07/11/2019 CLINICAL DATA:  Inpatient with abdominal distension EXAM: ABDOMEN - 1 VIEW COMPARISON:  Radiograph 07/08/2019 FINDINGS: No high-grade obstructive bowel gas pattern. Air and stool present throughout the colon and overlying the rectal vault. No suspicious calcifications over the renal shadows or expected course of either ureter. Surgical clips are present the right upper quadrant may reflect prior cholecystectomy. There are additional postprocedural changes related to prior vertebroplasty. Multilevel discogenic and facet degenerative changes are noted in the spine with additional degenerative features in the SI joints, symphysis pubis and both hips. No acute or suspicious osseous lesions are evident. IMPRESSION: No high-grade obstructive bowel gas pattern. No suspicious  calcifications. Electronically Signed   By: Lovena Le M.D.   On: 07/11/2019 20:17   US Paracentesis  Result Date: 07/12/2019 INDICATION: Cirrhosis and recurrent ascites. EXAM: ULTRASOUND GUIDED PARACENTESIS MEDICATIONS: None. COMPLICATIONS: None immediate. PROCEDURE: Informed written consent was obtained from the patient's family after a discussion of the risks, benefits and alternatives to treatment. A timeout was performed prior to the initiation of the procedure. Initial ultrasound scanning demonstrates a large amount of ascites within the right lower abdominal quadrant. The right lower abdomen was prepped and draped in the usual sterile fashion. 1% lidocaine was used for local anesthesia. Following this, a 6 Fr Safe-T-Centesis catheter was introduced. An ultrasound image was saved for documentation purposes. The paracentesis was performed. The catheter was removed and a dressing was applied. The patient tolerated the procedure well without immediate post procedural complication. FINDINGS: A total of approximately 3069 mL of serosanguineous fluid was removed. IMPRESSION: Successful ultrasound-guided paracentesis yielding 3069 mL of peritoneal fluid. Electronically Signed   By: Markus Daft M.D.   On: 07/12/2019 15:08      Scheduled Meds: . docusate sodium  200 mg Oral BID  . feeding supplement (NEPRO CARB STEADY)  237 mL Oral BID BM  . heparin  5,000 Units Subcutaneous Q8H  . lactulose  30 g Oral BID  . levothyroxine  75 mcg Oral Daily  . metoprolol succinate  50 mg Oral Daily  . midodrine  10 mg Oral TID WC  . mirtazapine  15 mg Oral QHS  . pantoprazole  20 mg Oral Daily  . sodium bicarbonate  650 mg Oral BID   Continuous Infusions: . furosemide (LASIX) infusion 4 mg/hr (07/11/19 1700)     LOS: 7 days      Debbe Odea, MD [facility] Pager: [email or web address] 07/12/2019, 5:32 PM

## 2019-07-12 NOTE — Progress Notes (Signed)
Occupational Therapy Treatment Patient Details Name: Desiree Koch MRN: [id number] DOB: [birth year] Today's Date: 07/12/2019    History of present illness Pt is 86 y/o F with PMH: paroxysmal A. fib/flutter not on AC due to history of bleeding, CAD, CKD stage III, chronic systolic CHF (EF 0000000),  hepatic cirrhosis, HTN, hypothyroidism. Was here 2 [place] for LE swelling r/t CHF exacerbation and decompensated cirrhosis with ascites. Presents on this admission d/t generalized weakness. Pt  was found to be hyperkalemic.   OT comments  Desiree Koch presents to OT with low mood and motivation to participate in therapeutic activities.  She reports getting "bad news" earlier today, and was perseverative on this throughout today's session.  OTR prompted pt to reflect on her values and goals when considering end of life care.  Pt was unable to state any goals or values, continuing to repeat that "she does not have much time".  OTR provided pt with supportive listening and validated feelings throughout session.  OTR educated pt on OT role and value toward participation in meaningful occupations and daily routines.  Desiree Koch was unable to name any preferred occupations that she would like to participate in.  OTR encourage pt to participate in OOB mobility to improve strength, reduce pain, and improve activity tolerance, but pt declined.  Desiree Koch presents with some confusion and decreased short term memory.  She was perseverative on finding her cell phone that OTR was unable to find, and often confused her IV alarm for her cell phone ringing.  Desiree Koch will benefit from continued skilled OT services in acute setting to optimize safety and independence in ADLs, as well as benefiting participation in meaningful occupations.  HHOT with 24 hour supervision remains most appropriate discharge recommendation at this time.   Follow Up Recommendations  Home health OT;Supervision/Assistance - 24 hour    Equipment  Recommendations  3 in 1 bedside commode    Recommendations for Other Services      Precautions / Restrictions Precautions Precautions: Fall Restrictions Weight Bearing Restrictions: No       Mobility Bed Mobility Overal bed mobility: Needs Assistance             General bed mobility comments: Pt declined any mobility this date.  Transfers                      Balance Overall balance assessment: Needs assistance                                         ADL either performed or assessed with clinical judgement   ADL                                         General ADL Comments: Pt able to perform seated UB ADLs with setup, requires MOD A for LB ADLs at this time d/t decreased ability to bend at the waist d/t abdominal distention/discomfort.     Vision Patient Visual Report: No change from baseline     Perception     Praxis      Cognition Arousal/Alertness: Awake/alert Behavior During Therapy: WFL for tasks assessed/performed Overall Cognitive Status: Impaired/Different from baseline  General Comments: Pt appears confused, with decreased short term memory.  Pt perseverative on the news she received today, as well as finding her cell phone.  Suspect pt thought the IV alarm was her cell phone ringing, OTR oriented pt multiple times to what the beeping sound was.        Exercises Other Exercises Other Exercises: educated pt on importance of OOB mobility, strengthening and endurance training, and reflecting on values/engagement in meaningful occupations when faced with end of life prognosis   Shoulder Instructions       General Comments      Pertinent Vitals/ Pain       Pain Assessment: No/denies pain Pain Intervention(s): Limited activity within patient's tolerance;Monitored during session  Home Living                                          Prior  Functioning/Environment              Frequency  Min 2X/week        Progress Toward Goals  OT Goals(current goals can now be found in the care plan section)  Progress towards OT goals: OT to reassess next treatment  Acute Rehab OT Goals Patient Stated Goal: unable to state goal when prompted by OTR OT Goal Formulation: With patient Time For Goal Achievement: 07/20/19 Potential to Achieve Goals: [facility] Discharge plan remains appropriate    Co-evaluation                 AM-PAC OT "6 Clicks" Daily Activity     Outcome Measure   Help from another person eating meals?: None Help from another person taking care of personal grooming?: A Little Help from another person toileting, which includes using toliet, bedpan, or urinal?: A Lot Help from another person bathing (including washing, rinsing, drying)?: A Lot Help from another person to put on and taking off regular upper body clothing?: A Little Help from another person to put on and taking off regular lower body clothing?: A Lot 6 Click Score: 16    End of Session    OT Visit Diagnosis: Muscle weakness (generalized) (M62.81);Pain Pain - part of body: (abdomen)   Activity Tolerance Patient limited by fatigue   Patient Left in bed;with call bell/phone within reach;with bed alarm set   Nurse Communication Other (comment)(pt status, pt's IV alarm)        Time: [id number] OT Time Calculation (min): 23 min  Charges: OT General Charges $OT Visit: 1 Visit OT Treatments $Self Care/Home Management : 23-37 mins  Myrtie Hawk Aleczander Fandino, OTR/L 07/12/19, 4:32 PM

## 2019-07-12 NOTE — Procedures (Signed)
Interventional Radiology Procedure:   Indications: Cirrhosis with recurrent ascites.  Procedure: US guided paracentesis  Findings: Removed 3069 ml from right lower quadrant  Complications: None     EBL: Less than 10 ml   Desiree Vining R. Anselm Pancoast, MD  Pager: [phone]

## 2019-07-12 NOTE — Consult Note (Addendum)
Consultation Note Date: 07/12/2019   Patient Name: Desiree Koch  DOB: Jan 04, 1931  MRN: [id number]  Age / Sex: 86 y.o., female  PCP: Adin Hector, MD Referring Physician: Debbe Odea, MD  Reason for Consultation: Establishing goals of care  HPI/Patient Profile: Desiree Koch is a 86 y.o. female with medical history significant for paroxysmal A. fib/flutter not on anticoagulation due to history of bleeding, CAD, CKD stage III, chronic systolic CHF (EF 0000000 by TTE 06/16/2019), LBBB, hepatic cirrhosis, hypertension, hypothyroidism who presents to the ED for evaluation of generalized weakness.   Clinical Assessment and Goals of Care: Patient is sitting in bed with daughter at bedside. Patient is widowed with 4 living children. Her daughter died in 03-19-2023.   She has been living home alone, independently. She drives. She is very clear that any QOL that is less than that is not acceptable. Her daughter discusses her health decline since her daughter died with vertebral fx, A-fib, edema, and an MI.    We discussed her diagnosis, prognosis given her heart failure with EF of 20-25% and cirrhosis requiring multiple paracentesis procedures, GOC, EOL wishes disposition and options.  A detailed discussion was had today regarding advanced directives.  Concepts specific to code status, artifical feeding and hydration, IV antibiotics and rehospitalization were discussed.  The difference between an aggressive medical intervention path and a comfort care path was discussed.  Values and goals of care important to patient and family were attempted to be elicited.  Discussed limitations of medical interventions to prolong quality of life in some situations and discussed the concept of human mortality.  She would not want to continue to return to the hospital for paracentesis and understands she will continue to  require them for life prolongation; she is not happy with her QOL, but wants to continue with aggressive care. She does not elaborate.   Will return tomorrow for further.    SUMMARY OF RECOMMENDATIONS    Will return tomorrow for further at 10:00. Continue full code/full scope.    Prognosis:   Poor      Primary Diagnoses: Present on Admission: . Hyperkalemia . AF (paroxysmal atrial fibrillation) ([place]) . CAD (coronary artery disease) . Hypothyroidism . Other cirrhosis of liver ([place])   I have reviewed the medical record, interviewed the patient and family, and examined the patient. The following aspects are pertinent.  Past Medical History:  Diagnosis Date  . Arthritis   . Cancer ([place]) 2018   skin ca  . Cardiac arrhythmia   . Coronary artery disease   . CRD (chronic renal disease)   . Dysrhythmia   . Heart murmur   . HOH (hard of hearing)   . Hypercholesteremia   . Hypertension   . Hypothyroidism   . Leaky heart valve   . Liver damage   . Lower extremity edema   . Multiple allergies   . Myocardial infarction ([place])   . Osteoporosis   . Pneumonia    Social History   Socioeconomic History  .  Marital status: Widowed    Spouse name: Not on file  . Number of children: Not on file  . Years of education: Not on file  . Highest education level: Not on file  Occupational History  . Not on file  Tobacco Use  . Smoking status: Never Smoker  . Smokeless tobacco: Never Used  Substance and Sexual Activity  . Alcohol use: No  . Drug use: No  . Sexual activity: Not on file  Other Topics Concern  . Not on file  Social History Narrative  . Not on file   Social Determinants of Health   Financial Resource Strain:   . Difficulty of Paying Living Expenses:   Food Insecurity:   . Worried About Charity fundraiser in the Last Year:   . Arboriculturist in the Last Year:   Transportation Needs:   . Film/video editor (Medical):   Marland Kitchen Lack of Transportation  (Non-Medical):   Physical Activity:   . Days of Exercise per Week:   . Minutes of Exercise per Session:   Stress:   . Feeling of Stress :   Social Connections:   . Frequency of Communication with Friends and Family:   . Frequency of Social Gatherings with Friends and Family:   . Attends Religious Services:   . Active Member of Clubs or Organizations:   . Attends Archivist Meetings:   Marland Kitchen Marital Status:    Family History  Problem Relation Age of Onset  . Heart disease Father   . Stroke Sister   . Cancer Brother   . Breast cancer Neg Hx    Scheduled Meds: . docusate sodium  200 mg Oral BID  . heparin  5,000 Units Subcutaneous Q8H  . lactulose  30 g Oral BID  . levothyroxine  75 mcg Oral Daily  . metoprolol succinate  50 mg Oral Daily  . midodrine  10 mg Oral TID WC  . mirtazapine  15 mg Oral QHS  . pantoprazole  20 mg Oral Daily  . sodium bicarbonate  650 mg Oral BID   Continuous Infusions: . albumin human Stopped (07/11/19 0956)  . albumin human    . furosemide (LASIX) infusion 4 mg/hr (07/11/19 1700)   PRN Meds:.acetaminophen **OR** acetaminophen, ondansetron **OR** ondansetron (ZOFRAN) IV Medications Prior to Admission:  Prior to Admission medications   Medication Sig Start Date End Date Taking? Authorizing Provider  acetaminophen (TYLENOL) 325 MG tablet Take 325-650 mg by mouth every 6 (six) hours as needed for mild pain or moderate pain.    Yes [provider]  cholecalciferol (VITAMIN D3) 25 MCG (1000 UT) tablet Take 2,000 Units by mouth daily.    Yes [provider]  digoxin 62.5 MCG TABS Take 0.0625 mg by mouth daily. 06/21/19 09/19/19 Yes Enzo Bi, MD  fexofenadine (ALLEGRA) 180 MG tablet Take 180 mg by mouth at bedtime.    Yes [provider]  fluticasone (FLONASE) 50 MCG/ACT nasal spray Place 2 sprays into both nostrils daily as needed for rhinitis.    Yes [provider]  furosemide (LASIX) 40 MG tablet Take 1 tablet  (40 mg total) by mouth daily. Patient taking differently: Take 80 mg by mouth daily.  06/21/19 09/19/19 Yes Enzo Bi, MD  Glucosamine 500 MG CAPS Take 500 mg by mouth daily.    Yes [provider]  levothyroxine (SYNTHROID, LEVOTHROID) 75 MCG tablet Take 75 mcg by mouth daily.   Yes [provider]  metoprolol succinate (TOPROL-XL) 100 MG 24 hr tablet Take 1 tablet (100 mg total) by mouth daily. 06/20/19 09/18/19 Yes Enzo Bi, MD  Multiple Vitamins-Minerals (PRESERVISION AREDS 2+MULTI VIT PO) Take 2 tablets by mouth daily.   Yes [provider]  potassium chloride SA (KLOR-CON) 20 MEQ tablet Take 20 mEq by mouth daily. 06/14/19  Yes [provider]  spironolactone (ALDACTONE) 25 MG tablet Take 0.5 tablets (12.5 mg total) by mouth daily. 06/21/19 09/19/19 Yes Enzo Bi, MD   Allergies  Allergen Reactions  . Amlodipine Swelling  . Clonidine Derivatives Swelling  . Norvasc [Amlodipine Besylate] Swelling  . Aspirin Other (See Comments)    Blood pressure goes up  . Celebrex [Celecoxib] Other (See Comments)    Blood pressure goes up  . Codeine Swelling  . Propoxyphene Swelling  . Latex Other (See Comments)    NEGATIVE RASH TEST 09/06/15  . Penicillins Other (See Comments)    Patient does not want medication Did it involve swelling of the face/tongue/throat, SOB, or low BP? Unknown Did it involve sudden or severe rash/hives, skin peeling, or any reaction on the inside of your mouth or nose? Unknown Did you need to seek medical attention at a hospital or doctor's office? Unknown When did it last happen?childhood If all above answers are "NO", may proceed with cephalosporin use.  . Ace Inhibitors Cough  . Ciprofloxacin Palpitations  . Flexeril [Cyclobenzaprine] Other (See Comments)    Blood pressure goes up  . Hctz [Hydrochlorothiazide] Other (See Comments)    Unknown  . Lisinopril Other (See Comments)    Susceptible to sun  . Meperidine And Related  Other (See Comments)    Unknown  . Pravastatin Rash  . Sulfa Antibiotics Rash   Review of Systems  All other systems reviewed and are negative.   Physical Exam Pulmonary:     Effort: Pulmonary effort is normal.  Neurological:     Mental Status: She is alert.     Vital Signs: BP 137/81 (BP Location: Left Arm)   Pulse 86   Temp 98.1 F (36.7 C) (Oral)   Resp 18   Ht 5\' 1"  (1.549 m)   Wt 65.1 kg   SpO2 99%   BMI 27.13 kg/m  Pain Scale: 0-10 POSS *See Group Information*: 1-Acceptable,Awake and alert Pain Score: 0-No pain   SpO2: SpO2: 99 % O2 Device:SpO2: 99 % O2 Flow Rate: .O2 Flow Rate (L/min): 0 L/min  IO: Intake/output summary:   Intake/Output Summary (Last 24 hours) at 07/12/2019 1154 Last data filed at 07/11/2019 1700 Gross per 24 hour  Intake 247.78 ml  Output --  Net 247.78 ml    LBM: Last BM Date: 07/09/19 Baseline Weight: Weight: 67.4 kg Most recent weight: Weight: 65.1 kg     Palliative Assessment/Data:     Time In: 8:45 Time Out: 9:40 Time Total: 55 min Greater than 50%  of this time was spent counseling and coordinating care related to the above assessment and plan.  Signed by: Asencion Gowda, NP   Please contact Palliative Medicine Team phone at [phone] for questions and concerns.  For individual provider: See Shea Evans

## 2019-07-12 NOTE — Progress Notes (Signed)
[facility]  ROUNDING NOTE   Subjective:   Patient is doing fair Appetite remains poor;   Abdomen is still distended continued on IV Lasix infusion and IV albumin supplementation  04/13 0701 - 04/14 0700 In: 571.8 [P.O.:480; I.V.:55.1; IV Piggyback:36.7] Out: 325 [Urine:325]    Objective:  Vital signs in last 24 hours:  Temp:  [97.6 F (36.4 C)-98.2 F (36.8 C)] 97.6 F (36.4 C) (04/14 1210) Pulse Rate:  [44-121] 58 (04/14 1358) Resp:  [16-20] 16 (04/14 1358) BP: (98-147)/(32-81) 122/49 (04/14 1358) SpO2:  [97 %-100 %] 98 % (04/14 1358)  Weight change:  Filed Weights   07/07/19 0420 07/09/19 0500 07/11/19 0441  Weight: 67.8 kg 66 kg 65.1 kg    Intake/Output: I/O last 3 completed shifts: In: 776.6 [P.O.:600; I.V.:101.1; IV Piggyback:75.4] Out: 725 [Urine:725]   Intake/Output this shift:  No intake/output data recorded.  Physical Exam: General: NAD,   Head: Normocephalic, atraumatic. Moist oral mucosal membranes  Eyes: Anicteric,  Neck: Supple,  Lungs:  Clear to auscultation  Heart: irregular  Abdomen:  +ascites  Extremities: trace peripheral edema.  Neurologic: Nonfocal, alert, able to answer questions appropriately  Skin: No lesions        Basic Metabolic Panel: Recent Labs  Lab 07/08/19 0541 07/08/19 0541 07/09/19 0721 07/09/19 0721 07/10/19 0611 07/11/19 0521 07/12/19 0531  NA 129*  --  133*  --  131* 135 135  K 5.3*  --  4.8  --  4.6 4.6 4.0  CL 99  --  99  --  97* 99 98  CO2 22  --  26  --  24 25 26   GLUCOSE 79  --  84  --  78 80 104*  BUN 41*  --  44*  --  47* 50* 48*  CREATININE 3.02*  --  3.30*  --  3.71* 3.74* 3.71*  CALCIUM 8.8*   < > 9.0   < > 8.9 9.3 9.3   < > = values in this interval not displayed.    Liver Function Tests: Recent Labs  Lab 07/07/19 0446 07/08/19 0541 07/09/19 0721 07/10/19 0611 07/12/19 0531  AST 43* 34 29 34 32  ALT 13 11 11 10 11   ALKPHOS 83 57 54 65 59  BILITOT 2.9* 3.1* 2.6* 2.8*  2.7*  PROT 5.8* 5.6* 5.2* 5.6* 5.6*  ALBUMIN 2.8* 3.4* 3.4* 3.3* 3.4*   No results for input(s): LIPASE, AMYLASE in the last 168 hours. No results for input(s): AMMONIA in the last 168 hours.  CBC: Recent Labs  Lab 07/08/19 0541 07/09/19 0721 07/10/19 0611 07/11/19 0521 07/12/19 0531  WBC 6.2 7.2 8.6 7.3 8.3  HGB 8.9* 8.2* 9.7* 9.7* 9.9*  HCT 25.6* 24.2* 28.7* 28.4* 29.2*  MCV 103.2* 106.6* 106.3* 106.0* 105.8*  PLT 142* 143* 187 158 153    Cardiac Enzymes: No results for input(s): CKTOTAL, CKMB, CKMBINDEX, TROPONINI in the last 168 hours.  BNP: Invalid input(s): POCBNP  CBG: No results for input(s): GLUCAP in the last 168 hours.  Microbiology: Results for orders placed or performed during the hospital encounter of 07/04/19  SARS CORONAVIRUS 2 (TAT 6-24 HRS) Nasopharyngeal Nasopharyngeal Swab     Status: None   Collection Time: 07/04/19  4:43 PM   Specimen: Nasopharyngeal Swab  Result Value Ref Range Status   SARS Coronavirus 2 NEGATIVE NEGATIVE Final    Comment: (NOTE) SARS-CoV-2 target nucleic acids are NOT DETECTED. The SARS-CoV-2 RNA is generally detectable in upper and lower respiratory  specimens during the acute phase of infection. Negative results do not preclude SARS-CoV-2 infection, do not rule out co-infections with other pathogens, and should not be used as the sole basis for treatment or other patient management decisions. Negative results must be combined with clinical observations, patient history, and epidemiological information. The expected result is Negative. Fact Sheet for Patients: SugarRoll.be Fact Sheet for Healthcare Providers: https://www.woods-mathews.com/ This test is not yet approved or cleared by the [place] FDA and  has been authorized for detection and/or diagnosis of SARS-CoV-2 by FDA under an Emergency Use Authorization (EUA). This EUA will remain  in effect (meaning this test can be  used) for the duration of the COVID-19 declaration under Section 56 4(b)(1) of the Act, 21 U.S.C. section 360bbb-3(b)(1), unless the authorization is terminated or revoked sooner. Performed at [facility], [place] [address] [place], [place] [address]   Urine Culture     Status: Abnormal   Collection Time: 07/04/19  6:32 PM   Specimen: Urine, Clean Catch  Result Value Ref Range Status   Specimen Description   Final    URINE, CLEAN CATCH Performed at [facility], [address], [place], [place] [address]    Special Requests   Final    NONE Performed at [facility], [place] [address] [place], [place] [address]    Culture (A)  Final    80,000 COLONIES/mL MULTIPLE SPECIES PRESENT, SUGGEST RECOLLECTION   Report Status 07/06/2019 FINAL  Final    Coagulation Studies: No results for input(s): LABPROT, INR in the last 72 hours.  Urinalysis: No results for input(s): COLORURINE, LABSPEC, PHURINE, GLUCOSEU, HGBUR, BILIRUBINUR, KETONESUR, PROTEINUR, UROBILINOGEN, NITRITE, LEUKOCYTESUR in the last 72 hours.  Invalid input(s): APPERANCEUR    Imaging: DG Abd 1 View  Result Date: 07/11/2019 CLINICAL DATA:  Inpatient with abdominal distension EXAM: ABDOMEN - 1 VIEW COMPARISON:  Radiograph 07/08/2019 FINDINGS: No high-grade obstructive bowel gas pattern. Air and stool present throughout the colon and overlying the rectal vault. No suspicious calcifications over the renal shadows or expected course of either ureter. Surgical clips are present the right upper quadrant may reflect prior cholecystectomy. There are additional postprocedural changes related to prior vertebroplasty. Multilevel discogenic and facet degenerative changes are noted in the spine with additional degenerative features in the SI joints, symphysis pubis and both hips. No acute or suspicious osseous lesions are evident. IMPRESSION: No high-grade obstructive bowel gas pattern. No suspicious calcifications.  Electronically Signed   By: Lovena Le M.D.   On: 07/11/2019 20:17     Medications:   . albumin human Stopped (07/11/19 0956)  . albumin human    . furosemide (LASIX) infusion 4 mg/hr (07/11/19 1700)   . docusate sodium  200 mg Oral BID  . heparin  5,000 Units Subcutaneous Q8H  . lactulose  30 g Oral BID  . levothyroxine  75 mcg Oral Daily  . metoprolol succinate  50 mg Oral Daily  . midodrine  10 mg Oral TID WC  . mirtazapine  15 mg Oral QHS  . pantoprazole  20 mg Oral Daily  . sodium bicarbonate  650 mg Oral BID   acetaminophen **OR** acetaminophen, ondansetron **OR** ondansetron (ZOFRAN) IV  Assessment/ Plan:  Ms. DRUANN STOLZMAN is a 86 y.o. white female withsystolic congestive heart failure, atrial fibrillation, hypertension, hypothyroidism, hepatic cirrhosis, coronary artery diseaseadmitted on 07/04/2019 for Hyperkalemia [E87.5] Ventricular ectopy [I49.3] Acute renal failure superimposed on chronic kidney disease, unspecified CKD stage, unspecified acute  renal failure type ([place]) [N17.9, N18.9]  June 16, 2019: 2D echo: LVEF 20 to 25%, regional wall motion abnormalities, left ventricular internal cavity moderately dilated, mildly elevated pulmonary artery systolic pressure, dilated left and right atrium  1. Acute renal failure with hyperkalemia on chronic kidney disease stage IIIB:  #Chronic systolic congestive heart failure, pulmonary hypertension baseline creatinine of 1.4, GFR of 36 on 04/13/2019.  Chronic Kidney Disease most likely due to hypertensive nephrosclerosis Worsening of renal failure is likely secondary to abnormal cardiorenal hemodynamics   Lab Results  Component Value Date   CREATININE 3.71 (H) 07/12/2019   CREATININE 3.74 (H) 07/11/2019   CREATININE 3.71 (H) 07/10/2019    Less likely hepatorenal syndrome.  With underlying cardiac arrhythmias and hypotension, patient is more likely to have ATN related renal insufficiency Continue midodrine in her  regimen to maintain higher blood pressures.  Trial of IV furosemide to control edema and for relief of ascites Serum creatinine appears to be stabilizing.  Hopefully we will see some improvement in the next few days. Remeron for appetite stimulation  2. Cirrhosis with ascites: 4 liters Removed with paracentesis on April 9 Furosemide restarted as low-dose IV infusion for sustained effect Continue to hold spironolactone due to hyperkalemia.  Overall prognosis appears poor.      patient's daughterJodie Cambell [id number]   LOS: 7 Jessina Marse 4/14/20212:38 PM

## 2019-07-12 NOTE — Progress Notes (Signed)
metoprolol morning does has been held do to BP 131/44 and HR 44. Vitals to be rechecked in an hour.

## 2019-07-13 DIAGNOSIS — K7031 Alcoholic cirrhosis of liver with ascites: Secondary | ICD-10-CM

## 2019-07-13 DIAGNOSIS — I10 Essential (primary) hypertension: Secondary | ICD-10-CM | POA: Diagnosis not present

## 2019-07-13 DIAGNOSIS — I5041 Acute combined systolic (congestive) and diastolic (congestive) heart failure: Secondary | ICD-10-CM

## 2019-07-13 DIAGNOSIS — N179 Acute kidney failure, unspecified: Secondary | ICD-10-CM | POA: Diagnosis not present

## 2019-07-13 DIAGNOSIS — E875 Hyperkalemia: Secondary | ICD-10-CM | POA: Diagnosis not present

## 2019-07-13 MED ORDER — HALOPERIDOL LACTATE 2 MG/ML PO CONC
0.5000 mg | ORAL | Status: DC | PRN
  Filled 2019-07-13: qty 0.3

## 2019-07-13 MED ORDER — HALOPERIDOL LACTATE 5 MG/ML IJ SOLN: 0.5000 mg | INTRAMUSCULAR | Status: DC | PRN

## 2019-07-13 MED ORDER — PROCHLORPERAZINE EDISYLATE 10 MG/2ML IJ SOLN: 10.0000 mg | Freq: Two times a day (BID) | INTRAMUSCULAR | Status: DC | PRN

## 2019-07-13 MED ORDER — LORAZEPAM 2 MG/ML IJ SOLN: 0.5000 mg | INTRAMUSCULAR | Status: DC | PRN

## 2019-07-13 MED ORDER — LORAZEPAM 1 MG PO TABS: 1.0000 mg | ORAL_TABLET | ORAL | Status: DC | PRN

## 2019-07-13 MED ORDER — MORPHINE SULFATE (CONCENTRATE) 10 MG/0.5ML PO SOLN: 5.0000 mg | ORAL | Status: DC | PRN

## 2019-07-13 MED ORDER — SODIUM CHLORIDE 0.9% FLUSH: 3.0000 mL | INTRAVENOUS | Status: DC | PRN

## 2019-07-13 MED ORDER — MORPHINE SULFATE (CONCENTRATE) 10 MG/0.5ML PO SOLN: 5.0000 mg | ORAL | 0 refills | Status: AC | PRN

## 2019-07-13 MED ORDER — PROCHLORPERAZINE MALEATE 10 MG PO TABS
10.0000 mg | ORAL_TABLET | Freq: Four times a day (QID) | ORAL | Status: DC | PRN
  Filled 2019-07-13: qty 1

## 2019-07-13 MED ORDER — SODIUM CHLORIDE 0.9% FLUSH: 3.0000 mL | Freq: Two times a day (BID) | INTRAVENOUS | Status: DC

## 2019-07-13 MED ORDER — ONDANSETRON HCL 4 MG PO TABS: 4.0000 mg | ORAL_TABLET | Freq: Four times a day (QID) | ORAL | 0 refills | Status: AC | PRN

## 2019-07-13 MED ORDER — GLYCOPYRROLATE 0.2 MG/ML IJ SOLN
0.2000 mg | INTRAMUSCULAR | Status: DC | PRN
  Filled 2019-07-13: qty 1

## 2019-07-13 MED ORDER — BIOTENE DRY MOUTH MT LIQD: 15.0000 mL | OROMUCOSAL | Status: DC | PRN

## 2019-07-13 MED ORDER — GLYCOPYRROLATE 1 MG PO TABS
1.0000 mg | ORAL_TABLET | ORAL | Status: DC | PRN
  Filled 2019-07-13: qty 1

## 2019-07-13 MED ORDER — POLYVINYL ALCOHOL 1.4 % OP SOLN
1.0000 [drp] | Freq: Four times a day (QID) | OPHTHALMIC | Status: DC | PRN
  Filled 2019-07-13: qty 15

## 2019-07-13 MED ORDER — GLYCOPYRROLATE 1 MG PO TABS: 1.0000 mg | ORAL_TABLET | ORAL | 0 refills | Status: AC | PRN

## 2019-07-13 MED ORDER — PROCHLORPERAZINE 25 MG RE SUPP
25.0000 mg | Freq: Two times a day (BID) | RECTAL | Status: DC | PRN
  Filled 2019-07-13: qty 1

## 2019-07-13 MED ORDER — METOPROLOL TARTRATE 5 MG/5ML IV SOLN
2.5000 mg | Freq: Four times a day (QID) | INTRAVENOUS | Status: DC | PRN
  Administered 2019-07-13 (×2): 2.5 mg via INTRAVENOUS
  Filled 2019-07-13 (×2): qty 5

## 2019-07-13 MED ORDER — LORAZEPAM 2 MG/ML PO CONC: 1.0000 mg | ORAL | Status: DC | PRN

## 2019-07-13 MED ORDER — HALOPERIDOL 0.5 MG PO TABS
0.5000 mg | ORAL_TABLET | ORAL | Status: DC | PRN
  Filled 2019-07-13: qty 1

## 2019-07-13 NOTE — Care Management Important Message (Signed)
Important Message  Patient Details  Name: Desiree Koch MRN: [id number] Date of Birth: 1930-11-06   Medicare Important Message Given:  Yes     Dannette Barbara 07/13/2019, 2:08 PM

## 2019-07-13 NOTE — Discharge Summary (Signed)
Physician Discharge Summary  Desiree Koch [id number] DOB: [birth year] DOA: 07/04/2019  PCP: Adin Hector, MD  Admit date: 07/04/2019 Discharge date: 07/13/2019  Admitted From: home Disposition:  Home with hospice   Recommendations for Outpatient Follow-up:  Patient requests palliative paracentesis  Discharge Condition:  stable   CODE STATUS:  DNR   Diet recommendation:  Diet as tolerated Consultations:  Nephrology  Palliative care  IR  Procedures/Studies: . IR paracentesis   Discharge Diagnoses:  Principal Problem:   Hyperkalemia Active Problems:   Acute on Chronic systolic CHF (congestive heart failure) (HCC)  Ascites with cirrhosis   Acute kidney injury superimposed on chronic kidney disease (HCC)   AF (paroxysmal atrial fibrillation) (HCC)   Hypothyroidism   CAD (coronary artery disease)     Brief Summary: Desiree Koch is a 86 y.o.femalewith medical history significant forparoxysmal A. fib/flutter not on anticoagulation due to history of bleeding, CAD, CKD stage III, chronic systolic CHF (EF 0000000 by TTE 06/16/2019), LBBB, hepatic cirrhosis, hypertension, hypothyroidism who presents to the ED for evaluation of generalized weakness. In ED> K 6.3, sodium 129, CR up to 2.61 from 1.59   Disposition: Today the patient has decided to transition to comfort can and want to go home as soon as possible. TOC has arranged for everything she needs at home. She would like to continue to get paracentesis for comfort as outpt. Comfort meds prescribed.    Hospital Course:  Principal Problem:   Hyperkalemia - in setting of Spironolactone and K supplement use - both held- Hyperkalemia resolved  Active Problems:   Acute kidney injury superimposed on chronic kidney disease -  Chronic systolic CHF  -   With EF of 20-25%- suspecting cardiorenal syndrome - appreciate nephrology management- treated with IV Lasix     Cirrhosis of liver with ascites - removal of  about 3 L of ascitic fluid 4/14 by IR    AF (paroxysmal atrial fibrillation)  - on Metoprolol - not on anticoagulation due to bleeding issues in the past - now in rapid A-fib/flutter with RVR- she has no chest pain or dyspnea on my eval       Discharge Exam: Vitals:   07/13/19 0931 07/13/19 1245  BP:  118/86  Pulse: (!) 125 (!) 124  Resp:  14  Temp:    SpO2: 94% 98%   Vitals:   07/13/19 0748 07/13/19 0800 07/13/19 0931 07/13/19 1245  BP: 116/71   118/86  Pulse: (!) 125  (!) 125 (!) 124  Resp:  18  14  Temp:      TempSrc:      SpO2: 98%  94% 98%  Weight:      Height:        General: Pt is alert, awake, not in acute distress Cardiovascular: RRR, S1/S2 +, no rubs, no gallops Respiratory: CTA bilaterally, no wheezing, no rhonchi Abdominal: Soft, NT, ND, bowel sounds + Extremities: no edema, no cyanosis   Discharge Instructions   Allergies as of 07/13/2019      Reactions   Amlodipine Swelling   Clonidine Derivatives Swelling   Norvasc [amlodipine Besylate] Swelling   Aspirin Other (See Comments)   Blood pressure goes up   Celebrex [celecoxib] Other (See Comments)   Blood pressure goes up   Codeine Swelling   Propoxyphene Swelling   Latex Other (See Comments)   NEGATIVE RASH TEST 09/06/15   Penicillins Other (See Comments)   Patient does not want medication Did it involve swelling  of the face/tongue/throat, SOB, or low BP? Unknown Did it involve sudden or severe rash/hives, skin peeling, or any reaction on the inside of your mouth or nose? Unknown Did you need to seek medical attention at a hospital or doctor's office? Unknown When did it last happen?childhood If all above answers are "NO", may proceed with cephalosporin use.   Ace Inhibitors Cough   Ciprofloxacin Palpitations   Flexeril [cyclobenzaprine] Other (See Comments)   Blood pressure goes up   Hctz [hydrochlorothiazide] Other (See Comments)   Unknown   Lisinopril Other (See Comments)    Susceptible to sun   Meperidine And Related Other (See Comments)   Unknown   Pravastatin Rash   Sulfa Antibiotics Rash      Medication List    STOP taking these medications   cholecalciferol 25 MCG (1000 UNIT) tablet Commonly known as: VITAMIN D3   Digoxin 62.5 MCG Tabs   Glucosamine 500 MG Caps   levothyroxine 75 MCG tablet Commonly known as: SYNTHROID   potassium chloride SA 20 MEQ tablet Commonly known as: KLOR-CON   PRESERVISION AREDS 2+MULTI VIT PO   spironolactone 25 MG tablet Commonly known as: ALDACTONE     TAKE these medications   acetaminophen 325 MG tablet Commonly known as: TYLENOL Take 325-650 mg by mouth every 6 (six) hours as needed for mild pain or moderate pain.   fexofenadine 180 MG tablet Commonly known as: ALLEGRA Take 180 mg by mouth at bedtime.   fluticasone 50 MCG/ACT nasal spray Commonly known as: FLONASE Place 2 sprays into both nostrils daily as needed for rhinitis.   furosemide 40 MG tablet Commonly known as: LASIX Take 1 tablet (40 mg total) by mouth daily. What changed: how much to take   glycopyrrolate 1 MG tablet Commonly known as: ROBINUL Take 1 tablet (1 mg total) by mouth every 4 (four) hours as needed (excessive secretions).   metoprolol succinate 100 MG 24 hr tablet Commonly known as: TOPROL-XL Take 1 tablet (100 mg total) by mouth daily.   morphine CONCENTRATE 10 MG/0.5ML Soln concentrated solution Place 0.25 mLs (5 mg total) under the tongue every 2 (two) hours as needed for moderate pain (or dyspnea).   ondansetron 4 MG tablet Commonly known as: ZOFRAN Take 1 tablet (4 mg total) by mouth every 6 (six) hours as needed for nausea.            Durable Medical Equipment  (From admission, onward)         Start     Ordered   07/08/19 0737  For home use only DME 3 n 1  Once     07/08/19 0736          Allergies  Allergen Reactions  . Amlodipine Swelling  . Clonidine Derivatives Swelling  . Norvasc  [Amlodipine Besylate] Swelling  . Aspirin Other (See Comments)    Blood pressure goes up  . Celebrex [Celecoxib] Other (See Comments)    Blood pressure goes up  . Codeine Swelling  . Propoxyphene Swelling  . Latex Other (See Comments)    NEGATIVE RASH TEST 09/06/15  . Penicillins Other (See Comments)    Patient does not want medication Did it involve swelling of the face/tongue/throat, SOB, or low BP? Unknown Did it involve sudden or severe rash/hives, skin peeling, or any reaction on the inside of your mouth or nose? Unknown Did you need to seek medical attention at a hospital or doctor's office? Unknown When did it last happen?childhood If all  above answers are "NO", may proceed with cephalosporin use.  . Ace Inhibitors Cough  . Ciprofloxacin Palpitations  . Flexeril [Cyclobenzaprine] Other (See Comments)    Blood pressure goes up  . Hctz [Hydrochlorothiazide] Other (See Comments)    Unknown  . Lisinopril Other (See Comments)    Susceptible to sun  . Meperidine And Related Other (See Comments)    Unknown  . Pravastatin Rash  . Sulfa Antibiotics Rash      DG Chest 2 View  Result Date: 07/04/2019 CLINICAL DATA:  Fatigue for 1 week EXAM: CHEST - 2 VIEW COMPARISON:  06/15/2019 FINDINGS: Cardiomegaly is again identified. The lungs are well aerated bilaterally. Blunting of the right costophrenic angle is noted stable in appearance from the prior exam. No acute infiltrate is seen. No bony abnormality is noted. Degenerative changes in the thoracic spine are seen. IMPRESSION: No active cardiopulmonary disease. Electronically Signed   By: Inez Catalina M.D.   On: 07/04/2019 16:40   DG Abd 1 View  Result Date: 07/11/2019 CLINICAL DATA:  Inpatient with abdominal distension EXAM: ABDOMEN - 1 VIEW COMPARISON:  Radiograph 07/08/2019 FINDINGS: No high-grade obstructive bowel gas pattern. Air and stool present throughout the colon and overlying the rectal vault. No suspicious calcifications  over the renal shadows or expected course of either ureter. Surgical clips are present the right upper quadrant may reflect prior cholecystectomy. There are additional postprocedural changes related to prior vertebroplasty. Multilevel discogenic and facet degenerative changes are noted in the spine with additional degenerative features in the SI joints, symphysis pubis and both hips. No acute or suspicious osseous lesions are evident. IMPRESSION: No high-grade obstructive bowel gas pattern. No suspicious calcifications. Electronically Signed   By: Lovena Le M.D.   On: 07/11/2019 20:17   DG Abd 1 View  Result Date: 07/08/2019 CLINICAL DATA:  Abdominal distension. EXAM: ABDOMEN - 1 VIEW COMPARISON:  None. FINDINGS: The bowel gas pattern is normal. Moderate amount of stool is seen throughout the large bowel. No radio-opaque calculi or other significant radiographic abnormality are seen. There is evidence of prior vertebroplasty at the level of L2 vertebral body. Radiopaque surgical clips are seen overlying the right upper quadrant. IMPRESSION: Moderate stool burden without evidence of bowel obstruction. Electronically Signed   By: Virgina Norfolk M.D.   On: 07/08/2019 22:59   US PELVIS LIMITED (TRANSABDOMINAL ONLY)  Result Date: 06/27/2019 CLINICAL DATA:  Bladder outlet obstruction. EXAM: LIMITED ULTRASOUND OF PELVIS TECHNIQUE: Limited transabdominal ultrasound examination of the pelvis was performed. COMPARISON:  None. FINDINGS: The urinary bladder has a normal appearance for the degree of distention. Pre-void volume: 52.1 mL Post-void volume: 4.4 mL Other findings:  None. IMPRESSION: Bladder scan with partially distended bladder that is unremarkable and no significant postvoid residual. Electronically Signed   By: Keith Rake M.D.   On: 06/27/2019 14:32   US RENAL  Result Date: 06/16/2019 CLINICAL DATA:  Acute kidney failure.  Chronic kidney disease. EXAM: RENAL / URINARY TRACT ULTRASOUND  COMPLETE COMPARISON:  07/29/2018, ultrasound 02/22/2017 FINDINGS: Right Kidney: Renal measurements: 9.4 x 4.4 x 4.2 centimeters = volume: 90 mL. On previous ultrasound 02/22/2017, the RIGHT kidney measured 10.1 centimeters. Two small exophytic lesions are identified in the midpole region, measuring 0.9 and 1.2 centimeters. These lesions likely represent cysts given stability since 2018. Left Kidney: Renal measurements: 8.4 x 4.7 x 4.1 centimeters = volume: 86 mL. On previous ultrasound 02/22/2017, the LEFT kidney measured 10.0 centimeters. Echogenicity within normal limits. No mass or hydronephrosis  visualized. Bladder: Decompressed Other: None. IMPRESSION: Kidneys are smaller compared to prior studies.  No hydronephrosis. Stable exophytic RIGHT renal cysts. Electronically Signed   By: Nolon Nations M.D.   On: 06/16/2019 14:45   US Abdomen Limited  Result Date: 06/19/2019 CLINICAL DATA:  Renal failure, history of abdominal ascites EXAM: LIMITED ABDOMEN ULTRASOUND FOR ASCITES TECHNIQUE: Limited ultrasound survey for ascites was performed in all four abdominal quadrants. COMPARISON:  06/16/2019 FINDINGS: Survey ultrasound revealed only a small volume of ascites in the left lower quadrant. No large pocket for therapeutic paracentesis which was deferred after discussion with the patient, as there was no request for diagnostic labs from the fluid. IMPRESSION: 1. Small volume ascites.  Therapeutic paracentesis deferred. Electronically Signed   By: Lucrezia Europe M.D.   On: 06/19/2019 13:12   US Paracentesis  Result Date: 07/12/2019 INDICATION: Cirrhosis and recurrent ascites. EXAM: ULTRASOUND GUIDED PARACENTESIS MEDICATIONS: None. COMPLICATIONS: None immediate. PROCEDURE: Informed written consent was obtained from the patient's family after a discussion of the risks, benefits and alternatives to treatment. A timeout was performed prior to the initiation of the procedure. Initial ultrasound scanning demonstrates a  large amount of ascites within the right lower abdominal quadrant. The right lower abdomen was prepped and draped in the usual sterile fashion. 1% lidocaine was used for local anesthesia. Following this, a 6 Fr Safe-T-Centesis catheter was introduced. An ultrasound image was saved for documentation purposes. The paracentesis was performed. The catheter was removed and a dressing was applied. The patient tolerated the procedure well without immediate post procedural complication. FINDINGS: A total of approximately 3069 mL of serosanguineous fluid was removed. IMPRESSION: Successful ultrasound-guided paracentesis yielding 3069 mL of peritoneal fluid. Electronically Signed   By: Markus Daft M.D.   On: 07/12/2019 15:08   US Paracentesis  Result Date: 07/07/2019 INDICATION: Ascites. EXAM: ULTRASOUND GUIDED  PARACENTESIS MEDICATIONS: None. COMPLICATIONS: None immediate. PROCEDURE: Informed written consent was obtained from the patient after a discussion of the risks, benefits and alternatives to treatment. A timeout was performed prior to the initiation of the procedure. Initial ultrasound scanning demonstrates a large amount of ascites within the right lower abdominal quadrant. The right lower abdomen was prepped and draped in the usual sterile fashion. 1% lidocaine was used for local anesthesia. Following this, a 6 French catheter was introduced. An ultrasound image was saved for documentation purposes. The paracentesis was performed. The catheter was removed and a dressing was applied. The patient tolerated the procedure well without immediate post procedural complication. Patient received post-procedure intravenous albumin; see nursing notes for details. FINDINGS: A total of approximately 4 L of serosanguineous fluid was removed. IMPRESSION: Successful ultrasound-guided paracentesis yielding 4 liters of peritoneal fluid. Electronically Signed   By: Marcello Moores  Register   On: 07/07/2019 11:48   US  Paracentesis  Result Date: 06/27/2019 INDICATION: Patient with congestive heart failure, renal failure, recurrent ascites. Request to IR for therapeutic paracentesis 4 L maximum. EXAM: ULTRASOUND GUIDED THERAPEUTIC PARACENTESIS MEDICATIONS: 10 mL 1% lidocaine COMPLICATIONS: None immediate. PROCEDURE: Informed written consent was obtained from the patient after a discussion of the risks, benefits and alternatives to treatment. A timeout was performed prior to the initiation of the procedure. Initial ultrasound scanning demonstrates a large amount of ascites within the left lower abdominal quadrant. The left lower abdomen was prepped and draped in the usual sterile fashion. 1% lidocaine was used for local anesthesia. Following this, a 6 Fr Safe-T-Centesis catheter was introduced. An ultrasound image was saved for documentation purposes.  The paracentesis was performed. The catheter was removed and a dressing was applied. The patient tolerated the procedure well without immediate post procedural complication. FINDINGS: A total of approximately 4.0 L of hazy pink fluid was removed. IMPRESSION: Successful ultrasound-guided paracentesis yielding 4.0 liters of peritoneal fluid. Read by Candiss Norse, PA-C Electronically Signed   By: Jacqulynn Cadet M.D.   On: 06/27/2019 14:16   US Paracentesis  Result Date: 06/16/2019 INDICATION: Acute renal failure with ascites. EXAM: ULTRASOUND GUIDED  PARACENTESIS MEDICATIONS: None. COMPLICATIONS: None immediate. PROCEDURE: Informed written consent was obtained from the patient after a discussion of the risks, benefits and alternatives to treatment. A timeout was performed prior to the initiation of the procedure. Initial ultrasound scanning demonstrates a large amount of ascites within the left lower abdominal quadrant. The left lower abdomen was prepped and draped in the usual sterile fashion. 1% lidocaine was used for local anesthesia. Following this, a 6 Fr  Safe-T-Centesis catheter was introduced. An ultrasound image was saved for documentation purposes. The paracentesis was performed. The catheter was removed and a dressing was applied. The patient tolerated the procedure well without immediate post procedural complication. FINDINGS: A total of approximately 3 L of pink colored fluid was removed. Samples were sent to the laboratory as requested by the clinical team. Max volume to be removed was 3 L and there was residual fluid after the paracentesis. IMPRESSION: Successful ultrasound-guided paracentesis yielding 3 liters of peritoneal fluid. Electronically Signed   By: Markus Daft M.D.   On: 06/16/2019 14:51   DG Chest Portable 1 View  Result Date: 06/15/2019 CLINICAL DATA:  Shortness of breath. EXAM: PORTABLE CHEST 1 VIEW COMPARISON:  June 16, 2017. FINDINGS: Stable cardiomegaly. No pneumothorax or significant pleural effusion is noted. Stable mild interstitial densities are noted throughout both lungs which may represent scarring, but acute superimposed edema or inflammation cannot be excluded. Bony thorax is unremarkable. IMPRESSION: Stable mild interstitial densities are noted throughout both lungs which may represent scarring, but acute superimposed edema or inflammation cannot be excluded. Electronically Signed   By: Marijo Conception M.D.   On: 06/15/2019 11:53   ECHOCARDIOGRAM COMPLETE  Result Date: 06/16/2019    ECHOCARDIOGRAM REPORT   Patient Name:   TAMBI ODANIEL Date of Exam: 06/16/2019 Medical Rec #:  [id number]       Height:       61.0 in Accession #:    [id number]      Weight:       153.0 lb Date of Birth:  October 03, 1930      BSA:          1.686 m Patient Age:    48 years        BP:           108/70 mmHg Patient Gender: F               HR:           99 bpm. Exam Location:  [facility] Procedure: 2D Echo, Color Doppler and Cardiac Doppler Indications:     CHF- acute diastolic [id number]  History:         Patient has prior history of Echocardiogram examinations,  most                  recent 06/17/2017. CAD; Risk Factors:Hypertension. Pneumonia.  Sonographer:     Sherrie Sport RDCS (AE) Referring Phys:  [id number] Collier Bullock Diagnosing Phys: Yolonda Kida MD IMPRESSIONS  1. Left ventricular  ejection fraction, by estimation, is 20 to 25%. The left ventricle has severely decreased function. The left ventricle demonstrates regional wall motion abnormalities (see scoring diagram/findings for description). The left ventricular internal cavity size was mildly to moderately dilated. Left ventricular diastolic parameters were normal.  2. Right ventricular systolic function is mildly reduced. The right ventricular size is mildly enlarged. There is mildly elevated pulmonary artery systolic pressure.  3. Left atrial size was moderately dilated.  4. Right atrial size was moderately dilated.  5. The mitral valve is grossly normal. Moderate mitral valve regurgitation.  6. Tricuspid valve regurgitation is moderate.  7. The aortic valve is grossly normal. Aortic valve regurgitation is not visualized. FINDINGS  Left Ventricle: Left ventricular ejection fraction, by estimation, is 20 to 25%. The left ventricle has severely decreased function. The left ventricle demonstrates regional wall motion abnormalities. The left ventricular internal cavity size was mildly  to moderately dilated. There is no left ventricular hypertrophy. Abnormal (paradoxical) septal motion, consistent with left bundle branch block. Left ventricular diastolic parameters were normal. Right Ventricle: The right ventricular size is mildly enlarged. No increase in right ventricular wall thickness. Right ventricular systolic function is mildly reduced. There is mildly elevated pulmonary artery systolic pressure. The tricuspid regurgitant  velocity is 2.61 m/s, and with an assumed right atrial pressure of 10 mmHg, the estimated right ventricular systolic pressure is 0000000 mmHg. Left Atrium: Left atrial size was moderately  dilated. Right Atrium: Right atrial size was moderately dilated. Pericardium: There is no evidence of pericardial effusion. Mitral Valve: The mitral valve is grossly normal. Moderate mitral valve regurgitation. Tricuspid Valve: The tricuspid valve is grossly normal. Tricuspid valve regurgitation is moderate. Aortic Valve: The aortic valve is grossly normal. Aortic valve regurgitation is not visualized. Aortic valve mean gradient measures 2.2 mmHg. Aortic valve peak gradient measures 3.6 mmHg. Aortic valve area, by VTI measures 1.92 cm. Pulmonic Valve: The pulmonic valve was normal in structure. Pulmonic valve regurgitation is not visualized. Aorta: The aortic root is normal in size and structure. IAS/Shunts: No atrial level shunt detected by color flow Doppler.  LEFT VENTRICLE PLAX 2D LVIDd:         3.62 cm LVIDs:         3.33 cm LV PW:         1.16 cm LV IVS:        1.33 cm LVOT diam:     2.00 cm LV SV:         28 LV SV Index:   17 LVOT Area:     3.14 cm  LV Volumes (MOD) LV vol d, MOD A2C: 71.3 ml LV vol d, MOD A4C: 74.8 ml LV vol s, MOD A2C: 47.9 ml LV vol s, MOD A4C: 58.6 ml LV SV MOD A2C:     23.4 ml LV SV MOD A4C:     74.8 ml LV SV MOD BP:      20.8 ml RIGHT VENTRICLE RV S prime:     12.60 cm/s TAPSE (M-mode): 2.7 cm LEFT ATRIUM             Index       RIGHT ATRIUM           Index LA diam:        4.90 cm 2.91 cm/m  RA Area:     19.20 cm LA Vol (A2C):   72.8 ml 43.19 ml/m RA Volume:   51.30 ml  30.44 ml/m LA Vol (A4C):  81.9 ml 48.59 ml/m LA Biplane Vol: 83.1 ml 49.30 ml/m  AORTIC VALVE                   PULMONIC VALVE AV Area (Vmax):    1.81 cm    PV Vmax:        0.49 m/s AV Area (Vmean):   1.65 cm    PV Peak grad:   1.0 mmHg AV Area (VTI):     1.92 cm    RVOT Peak grad: 2 mmHg AV Vmax:           95.05 cm/s AV Vmean:          69.900 cm/s AV VTI:            0.147 m AV Peak Grad:      3.6 mmHg AV Mean Grad:      2.2 mmHg LVOT Vmax:         54.70 cm/s LVOT Vmean:        36.800 cm/s LVOT VTI:           0.090 m LVOT/AV VTI ratio: 0.61  AORTA Ao Root diam: 2.50 cm MITRAL VALVE               TRICUSPID VALVE MV Area (PHT): 5.13 cm    TR Peak grad:   27.2 mmHg MV Decel Time: 148 msec    TR Vmax:        261.00 cm/s MV E velocity: 92.20 cm/s                            SHUNTS                            Systemic VTI:  0.09 m                            Systemic Diam: 2.00 cm Dwayne Prince Rome MD Electronically signed by Yolonda Kida MD Signature Date/Time: 06/16/2019/1:26:31 PM    Final    [place] ASCITES (ABDOMEN LIMITED)  Result Date: 06/15/2019 CLINICAL DATA:  Abdominal distension for 1 month EXAM: LIMITED ABDOMEN ULTRASOUND FOR ASCITES TECHNIQUE: Limited ultrasound survey for ascites was performed in all four abdominal quadrants. COMPARISON:  07/29/2018 FINDINGS: Scattered ascites identified in all 4 quadrants, greater in lower quadrants. RIGHT pleural effusion also identified. IMPRESSION: Ascites throughout abdomen as well as RIGHT pleural effusion. Electronically Signed   By: Lavonia Dana M.D.   On: 06/15/2019 15:19     The results of significant diagnostics from this hospitalization (including imaging, microbiology, ancillary and laboratory) are listed below for reference.     Microbiology: Recent Results (from the past 240 hour(s))  SARS CORONAVIRUS 2 (TAT 6-24 HRS) Nasopharyngeal Nasopharyngeal Swab     Status: None   Collection Time: 07/04/19  4:43 PM   Specimen: Nasopharyngeal Swab  Result Value Ref Range Status   SARS Coronavirus 2 NEGATIVE NEGATIVE Final    Comment: (NOTE) SARS-CoV-2 target nucleic acids are NOT DETECTED. The SARS-CoV-2 RNA is generally detectable in upper and lower respiratory specimens during the acute phase of infection. Negative results do not preclude SARS-CoV-2 infection, do not rule out co-infections with other pathogens, and should not be used as the sole basis for treatment or other patient management decisions. Negative results must be combined with clinical  observations, patient history, and  epidemiological information. The expected result is Negative. Fact Sheet for Patients: SugarRoll.be Fact Sheet for Healthcare Providers: https://www.woods-mathews.com/ This test is not yet approved or cleared by the [place] FDA and  has been authorized for detection and/or diagnosis of SARS-CoV-2 by FDA under an Emergency Use Authorization (EUA). This EUA will remain  in effect (meaning this test can be used) for the duration of the COVID-19 declaration under Section 56 4(b)(1) of the Act, 21 U.S.C. section 360bbb-3(b)(1), unless the authorization is terminated or revoked sooner. Performed at [facility], [place] [address] [place], [place] [address]   Urine Culture     Status: Abnormal   Collection Time: 07/04/19  6:32 PM   Specimen: Urine, Clean Catch  Result Value Ref Range Status   Specimen Description   Final    URINE, CLEAN CATCH Performed at [facility] [facility], [address] [place], [place] [address]    Special Requests   Final    NONE Performed at [facility], [place] [address], [place], [place] [address]    Culture (A)  Final    80,000 COLONIES/mL MULTIPLE SPECIES PRESENT, SUGGEST RECOLLECTION   Report Status 07/06/2019 FINAL  Final     Labs: BNP (last 3 results) Recent Labs    06/15/19 1055 07/04/19 1613  BNP 924.0* [id number]*   Basic Metabolic Panel: Recent Labs  Lab 07/08/19 0541 07/09/19 0721 07/10/19 0611 07/11/19 0521 07/12/19 0531  NA 129* 133* 131* 135 135  K 5.3* 4.8 4.6 4.6 4.0  CL 99 99 97* 99 98  CO2 22 26 24 25 26   GLUCOSE 79 84 78 80 104*  BUN 41* 44* 47* 50* 48*  CREATININE 3.02* 3.30* 3.71* 3.74* 3.71*  CALCIUM 8.8* 9.0 8.9 9.3 9.3   Liver Function Tests: Recent Labs  Lab 07/07/19 0446 07/08/19 0541 07/09/19 0721 07/10/19 0611 07/12/19 0531  AST 43* 34 29 34 32  ALT 13 11 11 10 11   ALKPHOS 83 57 54 65 59  BILITOT 2.9* 3.1* 2.6* 2.8*  2.7*  PROT 5.8* 5.6* 5.2* 5.6* 5.6*  ALBUMIN 2.8* 3.4* 3.4* 3.3* 3.4*   No results for input(s): LIPASE, AMYLASE in the last 168 hours. No results for input(s): AMMONIA in the last 168 hours. CBC: Recent Labs  Lab 07/08/19 0541 07/09/19 0721 07/10/19 0611 07/11/19 0521 07/12/19 0531  WBC 6.2 7.2 8.6 7.3 8.3  HGB 8.9* 8.2* 9.7* 9.7* 9.9*  HCT 25.6* 24.2* 28.7* 28.4* 29.2*  MCV 103.2* 106.6* 106.3* 106.0* 105.8*  PLT 142* 143* 187 158 153   Cardiac Enzymes: No results for input(s): CKTOTAL, CKMB, CKMBINDEX, TROPONINI in the last 168 hours. BNP: Invalid input(s): POCBNP CBG: No results for input(s): GLUCAP in the last 168 hours. D-Dimer No results for input(s): DDIMER in the last 72 hours. Hgb A1c No results for input(s): HGBA1C in the last 72 hours. Lipid Profile No results for input(s): CHOL, HDL, LDLCALC, TRIG, CHOLHDL, LDLDIRECT in the last 72 hours. Thyroid function studies No results for input(s): TSH, T4TOTAL, T3FREE, THYROIDAB in the last 72 hours.  Invalid input(s): FREET3 Anemia work up No results for input(s): VITAMINB12, FOLATE, FERRITIN, TIBC, IRON, RETICCTPCT in the last 72 hours. Urinalysis    Component Value Date/Time   COLORURINE AMBER (A) 07/04/2019 1832   APPEARANCEUR CLOUDY (A) 07/04/2019 1832   LABSPEC 1.016 07/04/2019 1832   PHURINE 5.0 07/04/2019 1832   GLUCOSEU NEGATIVE 07/04/2019 1832   HGBUR NEGATIVE 07/04/2019 1832   BILIRUBINUR NEGATIVE 07/04/2019 1832  KETONESUR NEGATIVE 07/04/2019 1832   PROTEINUR NEGATIVE 07/04/2019 1832   NITRITE NEGATIVE 07/04/2019 1832   LEUKOCYTESUR LARGE (A) 07/04/2019 1832   Sepsis Labs Invalid input(s): PROCALCITONIN,  WBC,  LACTICIDVEN Microbiology Recent Results (from the past 240 hour(s))  SARS CORONAVIRUS 2 (TAT 6-24 HRS) Nasopharyngeal Nasopharyngeal Swab     Status: None   Collection Time: 07/04/19  4:43 PM   Specimen: Nasopharyngeal Swab  Result Value Ref Range Status   SARS Coronavirus 2 NEGATIVE  NEGATIVE Final    Comment: (NOTE) SARS-CoV-2 target nucleic acids are NOT DETECTED. The SARS-CoV-2 RNA is generally detectable in upper and lower respiratory specimens during the acute phase of infection. Negative results do not preclude SARS-CoV-2 infection, do not rule out co-infections with other pathogens, and should not be used as the sole basis for treatment or other patient management decisions. Negative results must be combined with clinical observations, patient history, and epidemiological information. The expected result is Negative. Fact Sheet for Patients: SugarRoll.be Fact Sheet for Healthcare Providers: https://www.woods-mathews.com/ This test is not yet approved or cleared by the [place] FDA and  has been authorized for detection and/or diagnosis of SARS-CoV-2 by FDA under an Emergency Use Authorization (EUA). This EUA will remain  in effect (meaning this test can be used) for the duration of the COVID-19 declaration under Section 56 4(b)(1) of the Act, 21 U.S.C. section 360bbb-3(b)(1), unless the authorization is terminated or revoked sooner. Performed at [facility], [place] [address] [place], [place] [address]   Urine Culture     Status: Abnormal   Collection Time: 07/04/19  6:32 PM   Specimen: Urine, Clean Catch  Result Value Ref Range Status   Specimen Description   Final    URINE, CLEAN CATCH Performed at [facility], [address], [place], [place] [address]    Special Requests   Final    NONE Performed at [facility], [place] [address], [place], [place] [address]    Culture (A)  Final    80,000 COLONIES/mL MULTIPLE SPECIES PRESENT, SUGGEST RECOLLECTION   Report Status 07/06/2019 FINAL  Final     Time coordinating discharge in minutes: 65  SIGNED:   Debbe Odea, MD  [facility] 07/13/2019, 2:45 PM

## 2019-07-13 NOTE — TOC Progression Note (Addendum)
Transition of Care [facility]) - Progression Note    Patient Details  Name: Desiree Koch MRN: [id number] Date of Birth: 1930/09/22  Transition of [facility] CM/SW [place], LCSW Phone Number: 07/13/2019, 12:52 PM  Clinical Narrative: CSW met with patient and daughter who confirmed plan to return home with hospice services. Reviewed home hospice agencies. Preference is for [facility]. They already have a wheelchair, rolling walker, bedside commode, and bed rail. Daughter thinks a hospital bed would be beneficial. Referral made to [facility] for home hospice services.   2:03 pm: Olivia Mackie has met with patient and family and made arrangements for home with hospice services. She will order hospital bed and it does not have to be at the house before patient arrives. Olivia Mackie said family thinks patient will need EMS home. CSW confirmed address with daughter during initial conversation. Notified palliative NP and attending MD that patient can discharge once orders and summary are in.  Expected Discharge Plan: [place] Barriers to Discharge: Continued Medical Work up  Expected Discharge Plan and Services Expected Discharge Plan: [place] In-house Referral: Clinical Social Work                                             Social Determinants of Health (SDOH) Interventions    Readmission Risk Interventions Readmission Risk Prevention Plan 07/11/2019  Transportation Screening Complete  PCP or Specialist Appt within 3-5 Days Complete  HRI or [place] Complete  Social Work Consult for [place] Planning/Counseling Complete  Palliative Care Screening Complete  Medication Review Press photographer) Complete  Some recent data might be hidden

## 2019-07-13 NOTE — Plan of Care (Signed)
The patient has been discharged via EMS. IV removed. Education performed with daughter.  Problem: Education: Goal: Knowledge of General Education information will improve Description: Including pain rating scale, medication(s)/side effects and non-pharmacologic comfort measures Outcome: Completed/Met   Problem: Health Behavior/Discharge Planning: Goal: Ability to manage health-related needs will improve Outcome: Completed/Met   Problem: Clinical Measurements: Goal: Ability to maintain clinical measurements within normal limits will improve Outcome: Completed/Met   Problem: Clinical Measurements: Goal: Ability to maintain clinical measurements within normal limits will improve Outcome: Completed/Met Goal: Will remain free from infection Outcome: Completed/Met Goal: Diagnostic test results will improve Outcome: Completed/Met Goal: Respiratory complications will improve Outcome: Completed/Met Goal: Cardiovascular complication will be avoided Outcome: Completed/Met   Problem: Clinical Measurements: Goal: Will remain free from infection Outcome: Completed/Met   Problem: Coping: Goal: Level of anxiety will decrease Outcome: Completed/Met   Problem: Elimination: Goal: Will not experience complications related to bowel motility Outcome: Completed/Met Goal: Will not experience complications related to urinary retention Outcome: Completed/Met   Problem: Pain Managment: Goal: General experience of comfort will improve Outcome: Completed/Met   Problem: Safety: Goal: Ability to remain free from injury will improve Outcome: Completed/Met   Problem: Skin Integrity: Goal: Risk for impaired skin integrity will decrease Outcome: Completed/Met   Problem: Acute Rehab OT Goals (only OT should resolve) Goal: Pt. Will Perform Lower Body Dressing Outcome: Completed/Met Goal: Pt. Will Transfer To Toilet Outcome: Completed/Met Goal: Pt. Will Perform Toileting-Clothing  Manipulation Outcome: Completed/Met

## 2019-07-13 NOTE — Progress Notes (Signed)
PT Cancellation Note  Patient Details Name: Desiree Koch MRN: [id number] DOB: 14-Apr-1930   Cancelled Treatment:     PT  Orders discontinued by Asencion Gowda. Pt is going home with hospice. Acute PT signing off   Willette Pa 07/13/2019, 1:55 PM

## 2019-07-13 NOTE — Progress Notes (Signed)
Patient had taken telemetry off, thought it was her glasses.  Reapplied telemetry unit and noticed that HR had increased to a sustained 118-127; monitored and HR sustained in the mid 120's. CCMD called to verify onset of tachycardia; E.Ouma notified via secure text of sustained tachycardia.  New order written. Will continue to monitor. Barbaraann Faster, RN 4:11 AM4/15/2021

## 2019-07-13 NOTE — Progress Notes (Addendum)
Daily Progress Note   Patient Name: Desiree Koch       Date: 07/13/2019 DOB: 28-Dec-1930  Age: 86 y.o. MRN#: [id number] Attending Physician: Debbe Odea, MD Primary Care Physician: Adin Hector, MD Admit Date: 07/04/2019  Reason for Consultation/Follow-up: Establishing goals of care  Subjective: Patient is resting in bed. Her daughter [name] is at bedside. We discussed her paracentesis yesterday, as well as the previous ones, most recently on 4/9. Nursing in to provide medication for nausea. She states she can no longer do this, and apologizes to her daughter. She tells her daughter to make sure another named family member is taken care of. We discussed in great detail hospice care at hospice facility and home with hospice. The ladies discussed complicated family dynamics. She discusses a desire to see more people than visitation guidelines would allow due to covid restrictions. Jodie called another daughter who they state has not been involved since Ms. Uffelman's daughter's death in Feb 27, 2023. She states the daughter is unaware of any of the happenings over the past few months. Discussed diagnosis, prognosis, and plans with her per patient's wishes. Daughter was frustrated with no knowing about her mother sooner. Ultimately, she is supportive of whatever her mother wants.   Patient and family would like to go home with hospice. They are aware her symptom burden may become such that she will need to go to the hospice facility.    I completed a MOST form today and the signed original was placed in the chart. A photocopy was placed in the chart to be scanned into EMR. The patient outlined their wishes for the following treatment decisions, and the form was signed by patient and  HPOA:  Cardiopulmonary Resuscitation: Do Not Attempt Resuscitation (DNR/No CPR)  Medical Interventions: Comfort Measures: Keep clean, warm, and dry. Use medication by any route, positioning, wound care, and other measures to relieve pain and suffering. Use oxygen, suction and manual treatment of airway obstruction as needed for comfort. Do not transfer to the hospital unless comfort needs cannot be met in current location.  Antibiotics: No antibiotics (use other measures to relieve symptoms)  IV Fluids: No IV fluids (provide other measures to ensure comfort)  Feeding Tube: No feeding tube    Length of Stay: 8  Current Medications: Scheduled Meds:  .  docusate sodium  200 mg Oral BID  . lactulose  30 g Oral BID  . levothyroxine  75 mcg Oral Daily  . metoprolol succinate  50 mg Oral Daily  . midodrine  10 mg Oral TID WC  . mirtazapine  15 mg Oral QHS  . pantoprazole  20 mg Oral Daily  . sodium bicarbonate  650 mg Oral BID  . sodium chloride flush  3 mL Intravenous Q12H    Continuous Infusions: . furosemide (LASIX) infusion 4 mg/hr (07/13/19 0300)    PRN Meds: acetaminophen **OR** acetaminophen, antiseptic oral rinse, glycopyrrolate **OR** glycopyrrolate **OR** glycopyrrolate, haloperidol **OR** haloperidol **OR** haloperidol lactate, LORazepam **OR** LORazepam **OR** LORazepam, morphine CONCENTRATE **OR** morphine CONCENTRATE, ondansetron **OR** ondansetron (ZOFRAN) IV, polyvinyl alcohol, prochlorperazine **OR** prochlorperazine **OR** prochlorperazine, sodium chloride flush  Physical Exam Pulmonary:     Effort: Pulmonary effort is normal.  Skin:    General: Skin is warm and dry.  Neurological:     Mental Status: She is alert.             Vital Signs: BP 116/71   Pulse (!) 125   Temp 99.1 F (37.3 C) (Oral)   Resp 18   Ht '5\' 1"'$  (1.549 m)   Wt 63.1 kg   SpO2 94%   BMI 26.28 kg/m  SpO2: SpO2: 94 % O2 Device: O2 Device: Room Air O2 Flow Rate: O2 Flow Rate (L/min): 0  L/min  Intake/output summary:   Intake/Output Summary (Last 24 hours) at 07/13/2019 1233 Last data filed at 07/13/2019 1000 Gross per 24 hour  Intake 365.13 ml  Output 0 ml  Net 365.13 ml   LBM: Last BM Date: 07/13/19 Baseline Weight: Weight: 67.4 kg Most recent weight: Weight: 63.1 kg       Palliative Assessment/Data:      Patient Active Problem List   Diagnosis Date Noted  . Abdominal distention   . Ascites of liver   . Hyperkalemia 07/04/2019  . Essential hypertension 07/04/2019  . Other cirrhosis of liver ([place]) 06/18/2019  . Ascites 06/18/2019  . Anemia 06/18/2019  . Acute on chronic systolic CHF (congestive heart failure) ([place]) 06/15/2019  . Acute kidney injury superimposed on chronic kidney disease ([place]) 06/15/2019  . AF (paroxysmal atrial fibrillation) ([place]) 06/15/2019  . Hypothyroidism 06/15/2019  . CAD (coronary artery disease) 06/15/2019  . Chronic systolic CHF (congestive heart failure) ([place]) 06/15/2019  . Bradycardia 06/16/2017    Palliative Care Assessment & Plan    Recommendations/Plan:  Home with hospice.     Code Status:    Code Status Orders  (From admission, onward)         Start     Ordered   07/13/19 1202  Do not attempt resuscitation (DNR)  Continuous    Question Answer Comment  In the event of cardiac or respiratory ARREST Do not call a "code blue"   In the event of cardiac or respiratory ARREST Do not perform Intubation, CPR, defibrillation or ACLS   In the event of cardiac or respiratory ARREST Use medication by any route, position, wound care, and other measures to relive pain and suffering. May use oxygen, suction and manual treatment of airway obstruction as needed for comfort.   Comments MOST form on chart.      07/13/19 1203        Code Status History    Date Active Date Inactive Code Status Order ID Comments User Context   07/04/2019 2005 07/13/2019 1203 Full Code [id number]  Posey Pronto,  Cleaster Corin, MD ED   06/15/2019 1450  06/20/2019 2223 Full Code [id number]  Collier Bullock, MD ED   06/16/2017 2203 06/18/2017 1542 Full Code [id number]  Dustin Flock, MD Inpatient   Advance Care Planning Activity       Prognosis:   < 2 weeks CHF EF 20-25%. Cirrhosis.  Frequent paracentesis. Lasix drip. Worsening renal function. Poor PO intake.     Care plan was discussed with MD, RN, SW.   Thank you for allowing the Palliative Medicine Team to assist in the care of this patient.   Time In: 10:00 Time Out: 12:00 Total Time 2 hours Prolonged Time Billed  yes       Greater than 50%  of this time was spent counseling and coordinating care related to the above assessment and plan.  Asencion Gowda, NP  Please contact Palliative Medicine Team phone at [phone] for questions and concerns.

## 2019-07-13 NOTE — Progress Notes (Signed)
Manufacturing engineer Desiree Koch) Koch Liaison RN note  Notified by Desiree Scrape, LCSW of [facility] team of patient/family request for Desiree Koch [facility] services at home after discharge. Chart and patient information under review by Desiree Koch physician. Hospice eligibility pending at this time.  Spoke with patient, daughter Desiree Koch, son Desiree Koch and son-in-law Desiree Koch at the bedside to initiate education related to hospice philosophy, services and team approach to care. Patient/family verbalized understanding of information given. Per discussion, plan is for discharge home today by EMS.  DME needs discussed. Patient requesting Koch bed with 1/2 rails, shower chair and OBT. Home address has been verified in the chart and is correct. Desiree Koch is the person to contact at [phone] to arrange time for equipment delivery.  Please send completed and signed DNR home with patient at the time of discharge.  Patient will need prescriptions for discharge comfort medications.  [facility] Referral Koch aware of the above. [facility] information and contact numbers given to Desiree Koch. Above information shared Desiree Pulling, LCSW with Desiree Koch team.  Please call with any hospice related questions or concerns.  Thank you for this referral.  Desiree Joy, RN Desiree Koch Liaison [phone]

## 2019-07-13 NOTE — TOC Transition Note (Signed)
Transition of Care [facility]) - CM/SW Discharge Note   Patient Details  Name: EARNEST ARMFIELD MRN: [id number] Date of Birth: [birth year]  Transition of [facility]) CM/SW Contact:  Candie Chroman, LCSW Phone Number: 07/13/2019, 4:03 PM   Clinical Narrative:  Patient has orders to discharge home with hospice today. Nurse has called and set up EMS transport. No further concerns. CSW signing off.   Final next level of care: Home w Hospice Care Barriers to Discharge: Barriers Resolved   Patient Goals and CMS Choice   CMS Medicare.gov Compare Post Acute Care list provided to:: Patient Choice offered to / list presented to : Patient, Adult Children  Discharge Placement                Patient to be transferred to facility by: EMS Name of family member notified: Waylan Boga Patient and family notified of of transfer: 07/13/19  Discharge Plan and Services In-house Referral: Clinical Social Work              DME Arranged: Hospital bed DME Agency: Other - Comment([facility])                  Social Determinants of Health (SDOH) Interventions     Readmission Risk Interventions Readmission Risk Prevention Plan 07/11/2019  Transportation Screening Complete  PCP or Specialist Appt within 3-5 Days Complete  HRI or [place] Complete  Social Work Consult for [place] Planning/Counseling Complete  Palliative Care Screening Complete  Medication Review Press photographer) Complete  Some recent data might be hidden

## 2019-07-13 NOTE — Progress Notes (Signed)
OT Cancellation Note  Patient Details Name: Desiree Koch MRN: [id number] DOB: 12-Feb-1931   Cancelled Treatment:    Reason Eval/Treat Not Completed: Other (comment)  Upon chart review, plan is for patient to go home with hospice. OT orders discontinued by Asencion Gowda, NP with palliative care. Will end OT assignment at this time. Thank you for allowing therapy to participate in the care of this patient.   Gerrianne Scale, [place], OTR/L ascom [phone] 07/13/19, 2:10 PM

## 2019-07-21 ENCOUNTER — Other Ambulatory Visit: Payer: Self-pay | Admitting: Internal Medicine

## 2019-07-21 DIAGNOSIS — I5022 Chronic systolic (congestive) heart failure: Secondary | ICD-10-CM

## 2019-07-23 ENCOUNTER — Other Ambulatory Visit: Payer: Self-pay

## 2019-07-23 ENCOUNTER — Emergency Department
Admission: EM | Admit: 2019-07-23 | Discharge: 2019-07-24 | Disposition: A | Discharge status: Home / Self Care | Attending: Emergency Medicine | Admitting: Emergency Medicine

## 2019-07-23 DIAGNOSIS — I5022 Chronic systolic (congestive) heart failure: Secondary | ICD-10-CM | POA: Insufficient documentation

## 2019-07-23 DIAGNOSIS — Z515 Encounter for palliative care: Secondary | ICD-10-CM

## 2019-07-23 DIAGNOSIS — R0602 Shortness of breath: Secondary | ICD-10-CM | POA: Diagnosis present

## 2019-07-23 DIAGNOSIS — Z9049 Acquired absence of other specified parts of digestive tract: Secondary | ICD-10-CM | POA: Insufficient documentation

## 2019-07-23 DIAGNOSIS — I2510 Atherosclerotic heart disease of native coronary artery without angina pectoris: Secondary | ICD-10-CM | POA: Insufficient documentation

## 2019-07-23 DIAGNOSIS — Z91040 Latex allergy status: Secondary | ICD-10-CM | POA: Diagnosis not present

## 2019-07-23 DIAGNOSIS — K7460 Unspecified cirrhosis of liver: Secondary | ICD-10-CM | POA: Diagnosis not present

## 2019-07-23 DIAGNOSIS — E039 Hypothyroidism, unspecified: Secondary | ICD-10-CM | POA: Diagnosis not present

## 2019-07-23 DIAGNOSIS — I110 Hypertensive heart disease with heart failure: Secondary | ICD-10-CM | POA: Insufficient documentation

## 2019-07-23 DIAGNOSIS — Z85828 Personal history of other malignant neoplasm of skin: Secondary | ICD-10-CM | POA: Diagnosis not present

## 2019-07-23 DIAGNOSIS — I252 Old myocardial infarction: Secondary | ICD-10-CM | POA: Insufficient documentation

## 2019-07-23 DIAGNOSIS — Z79899 Other long term (current) drug therapy: Secondary | ICD-10-CM | POA: Diagnosis not present

## 2019-07-23 DIAGNOSIS — R188 Other ascites: Secondary | ICD-10-CM | POA: Insufficient documentation

## 2019-07-23 MED ORDER — MORPHINE SULFATE (CONCENTRATE) 10 MG/0.5ML PO SOLN: 5.0000 mg | ORAL | Status: DC | PRN

## 2019-07-23 MED ORDER — GLYCOPYRROLATE 1 MG PO TABS
1.0000 mg | ORAL_TABLET | ORAL | Status: DC | PRN
  Filled 2019-07-23: qty 1

## 2019-07-23 MED ORDER — FUROSEMIDE 40 MG PO TABS: 40.0000 mg | ORAL_TABLET | Freq: Every day | ORAL | Status: DC

## 2019-07-23 MED ORDER — ONDANSETRON HCL 4 MG PO TABS
4.0000 mg | ORAL_TABLET | Freq: Four times a day (QID) | ORAL | Status: DC | PRN
  Filled 2019-07-23: qty 1

## 2019-07-23 MED ORDER — METOPROLOL SUCCINATE ER 50 MG PO TB24: 100.0000 mg | ORAL_TABLET | Freq: Every day | ORAL | Status: DC

## 2019-07-23 MED ORDER — ONDANSETRON 4 MG PO TBDP
4.0000 mg | ORAL_TABLET | Freq: Four times a day (QID) | ORAL | Status: DC | PRN
  Administered 2019-07-23: 4 mg via ORAL
  Filled 2019-07-23: qty 1

## 2019-07-23 NOTE — ED Triage Notes (Signed)
Patient arrived from home via [facility], recent hospice patient (07/13/19), hx of kidney disease, cirrhosis, heart attack 2 weeks ago and went on comfort care/hospice at that time.  Patient arrived with DNR.  Daughter called EMS because patient was working to breathe, states fluid is removed from abdomen once every nine days, missed last appointment and would like that done today.  Patient is A&O at baseline, is currently alert but is not answering questions at this time.    EMS reports patient received 10 mg morphine from daughter 10 min before calling EMS and at that time HR dropped to 30 temporarily then back up.  With EMS:  108 pulse, 94/55 BP, 124/72, 98% on RA.  Hospice nurse will follow up, daughter will come to hospital soon.  Pt is HOH.

## 2019-07-23 NOTE — ED Provider Notes (Signed)
[facility] Emergency Department Provider Note   ____________________________________________   First MD Initiated Contact with Patient 07/23/19 2112     (approximate)  I have reviewed the triage vital signs and the nursing notes.   HISTORY  Chief Complaint Shortness of Breath   HPI Desiree Koch is a 86 y.o. female with past medical history of CAD, CKD, CHF, hypertension, paroxysmal A. fib, and cirrhosis who presents to the ED complaining of shortness of breath.  Per EMS, patient had transition to home hospice care earlier this month and has been receiving regular pain medication.  Daughter became concerned this evening when the patient appeared short of breath.  Daughter and stated to EMS that patient previously received regular paracentesis to help with her breathing, however they missed recent appointment with her admission.  They have had difficulty scheduling paracentesis since then and daughter called EMS with the hope that patient could receive paracentesis this evening.  Patient currently denies any complaints.        Past Medical History:  Diagnosis Date  . Arthritis   . Cancer ([place]) 2018   skin ca  . Cardiac arrhythmia   . Coronary artery disease   . CRD (chronic renal disease)   . Dysrhythmia   . Heart murmur   . HOH (hard of hearing)   . Hypercholesteremia   . Hypertension   . Hypothyroidism   . Leaky heart valve   . Liver damage   . Lower extremity edema   . Multiple allergies   . Myocardial infarction ([place])   . Osteoporosis   . Pneumonia     Patient Active Problem List   Diagnosis Date Noted  . Abdominal distention   . Ascites of liver   . Hyperkalemia 07/04/2019  . Essential hypertension 07/04/2019  . Other cirrhosis of liver ([place]) 06/18/2019  . Ascites 06/18/2019  . Anemia 06/18/2019  . Acute on chronic systolic CHF (congestive heart failure) ([place]) 06/15/2019  . Acute kidney injury superimposed on chronic kidney  disease ([place]) 06/15/2019  . AF (paroxysmal atrial fibrillation) ([place]) 06/15/2019  . Hypothyroidism 06/15/2019  . CAD (coronary artery disease) 06/15/2019  . Chronic systolic CHF (congestive heart failure) ([place]) 06/15/2019  . Bradycardia 06/16/2017    Past Surgical History:  Procedure Laterality Date  . ABDOMINAL HYSTERECTOMY    . CATARACT EXTRACTION W/PHACO Left 09/12/2015   Procedure: CATARACT EXTRACTION PHACO AND INTRAOCULAR LENS PLACEMENT (IOC);  Surgeon: Eulogio Bear, MD;  Location: [facility] ORS;  Service: Ophthalmology;  Laterality: Left;  Lot# [id number] H [place]: 01:02.0 AP%:15.3 CDE: 9.45  . CATARACT EXTRACTION W/PHACO Right 10/31/2015   Procedure: CATARACT EXTRACTION PHACO AND INTRAOCULAR LENS PLACEMENT (IOC);  Surgeon: Eulogio Bear, MD;  Location: [facility] ORS;  Service: Ophthalmology;  Laterality: Right;  [place] 01:16.2 AP% 14.8 CDE 11.27 Fluid Pack Lot # [id number]  . CESAREAN SECTION    . CHOLECYSTECTOMY    . KNEE ARTHROSCOPY    . KYPHOPLASTY N/A 03/17/2019   Procedure: KYPHOPLASTY L2;  Surgeon: Hessie Knows, MD;  Location: [facility] ORS;  Service: Orthopedics;  Laterality: N/A;  . THYROIDECTOMY      Prior to Admission medications   Medication Sig Start Date End Date Taking? Authorizing Provider  acetaminophen (TYLENOL) 325 MG tablet Take 325-650 mg by mouth every 6 (six) hours as needed for mild pain or moderate pain.     [provider]  fexofenadine (ALLEGRA) 180 MG tablet Take 180 mg by mouth at bedtime.  [provider]  fluticasone (FLONASE) 50 MCG/ACT nasal spray Place 2 sprays into both nostrils daily as needed for rhinitis.     [provider]  furosemide (LASIX) 40 MG tablet Take 1 tablet (40 mg total) by mouth daily. Patient taking differently: Take 80 mg by mouth daily.  06/21/19 09/19/19  Enzo Bi, MD  glycopyrrolate (ROBINUL) 1 MG tablet Take 1 tablet (1 mg total) by mouth every 4 (four) hours as needed (excessive secretions). 07/13/19   Debbe Odea, MD  metoprolol succinate (TOPROL-XL) 100 MG 24 hr tablet Take 1 tablet (100 mg total) by mouth daily. 06/20/19 09/18/19  Enzo Bi, MD  Morphine Sulfate (MORPHINE CONCENTRATE) 10 MG/0.5ML SOLN concentrated solution Place 0.25 mLs (5 mg total) under the tongue every 2 (two) hours as needed for moderate pain (or dyspnea). 07/13/19   Debbe Odea, MD  ondansetron (ZOFRAN) 4 MG tablet Take 1 tablet (4 mg total) by mouth every 6 (six) hours as needed for nausea. 07/13/19   Debbe Odea, MD    Allergies Amlodipine, Clonidine derivatives, Norvasc [amlodipine besylate], Aspirin, Celebrex [celecoxib], Codeine, Propoxyphene, Latex, Penicillins, Ace inhibitors, Ciprofloxacin, Flexeril [cyclobenzaprine], Hctz [hydrochlorothiazide], Lisinopril, Meperidine and related, Pravastatin, and Sulfa antibiotics  Family History  Problem Relation Age of Onset  . Heart disease Father   . Stroke Sister   . Cancer Brother   . Breast cancer Neg Hx     Social History Social History   Tobacco Use  . Smoking status: Never Smoker  . Smokeless tobacco: Never Used  Substance Use Topics  . Alcohol use: No  . Drug use: No    Review of Systems  Constitutional: No fever/chills Eyes: No visual changes. ENT: No sore throat. Cardiovascular: Denies chest pain. Respiratory: Positive for shortness of breath. Gastrointestinal: No abdominal pain.  No nausea, no vomiting.  No diarrhea.  No constipation. Genitourinary: Negative for dysuria. Musculoskeletal: Negative for back pain. Skin: Negative for rash. Neurological: Negative for headaches, focal weakness or numbness.  ____________________________________________   PHYSICAL EXAM:  VITAL SIGNS: ED Triage Vitals  Enc Vitals Group     BP      Pulse      Resp      Temp      Temp src      SpO2      Weight      Height      Head Circumference      Peak Flow      Pain Score      Pain Loc      Pain Edu?      Excl. in [place]?     Constitutional: Awake and  alert. Eyes: Conjunctivae are normal. Head: Atraumatic. Nose: No congestion/rhinnorhea. Mouth/Throat: Mucous membranes are moist. Neck: Normal ROM Cardiovascular: Tachycardic, regular rhythm. Grossly normal heart sounds. Respiratory: Slightly tachypneic with normal respiratory effort.  No retractions. Lungs CTAB. Gastrointestinal: Soft and nontender.  Distended abdomen with fluid wave. Genitourinary: deferred Musculoskeletal: No lower extremity tenderness nor edema. Neurologic:  Normal speech and language. No gross focal neurologic deficits are appreciated. Skin:  Skin is warm, dry and intact. No rash noted.  Jaundice noted. Psychiatric: Mood and affect are normal. Speech and behavior are normal.  ____________________________________________   LABS (all labs ordered are listed, but only abnormal results are displayed)  Labs Reviewed - No data to display   PROCEDURES  Procedure(s) performed (including Critical Care):  Procedures   ____________________________________________   INITIAL IMPRESSION / ASSESSMENT AND PLAN / ED COURSE  86 year old female with history of CAD, CKD, CHF, paroxysmal A. fib, hypertension, and cirrhosis presents to the ED for shortness of breath noted at home after missing her previously scheduled paracentesis.  Patient is slightly tachypneic here but not in any respiratory distress, denies any shortness of breath when asked.  She does have a distended abdomen with positive fluid wave and would likely benefit from paracentesis, although given she is breathing relatively comfortably this does not seem to need to be done emergently.  Daughter is currently on her way to the ED, we will discuss options when she arrives.  Given her hospice status, we will hold off on any lab work for now.  Daughter is now at the bedside and agrees with plan to hold off on any aggressive intervention and continue to focus on patient's comfort.  She is requesting that we  perform paracentesis to ease patient's breathing, which is reasonable given her tachypnea.  We will plan to observe patient until the morning, when IR is available for paracentesis.      ____________________________________________   FINAL CLINICAL IMPRESSION(S) / ED DIAGNOSES  Final diagnoses:  Cirrhosis of liver with ascites, unspecified hepatic cirrhosis type ([place])  Shortness of breath  Hospice care     ED Discharge Orders    None       Note:  This document was prepared using Dragon voice recognition software and may include unintentional dictation errors.   Blake Divine, MD 07/23/19 2210

## 2019-07-24 ENCOUNTER — Other Ambulatory Visit: Payer: Self-pay

## 2019-07-24 ENCOUNTER — Emergency Department

## 2019-07-24 ENCOUNTER — Ambulatory Visit: Payer: Medicare HMO

## 2019-07-24 NOTE — ED Notes (Signed)
DNR bracelet placed on right wrist  

## 2019-07-24 NOTE — Progress Notes (Addendum)
[facility] ED [place] RN note  Met with patient at bedside. No family present. Patient appears comfortable, no labored breathing. Barely able to open eyes with verbal and tactile stimuli. Not able to answer questions.  Spoke with daughter [name], by phone. Informed of paracentesis results with permission from Dr. Mikey Kirschner. Plan is for patient to discharge home by EMS. I will call for EMS when daughter notifies me she is home and able to receive patient.  Addendum: Second conversation with Jodie offering room at [facility]. At this time [name] would like to take her mom home and give her the opportunity to perk up some. Notified home care RN, Mitzi.  Please call with any hospice related questions or concerns.  Thank you. Margaretmary Eddy, BSN, RN [facility] Liaison [phone]

## 2019-07-24 NOTE — ED Provider Notes (Signed)
8:45 AM Assumed care for off going team.   Blood pressure 103/65, pulse 71, resp. rate 18, height 5\' 1"  (1.549 m), weight 63.1 kg, SpO2 97 %.  See their HPI for full report but in brief DNR- home hospice-difficulty breathing--liver failure pt pending paracentesis and pending dc.   Discussed with the IR team and patient's blood pressures are slightly soft although patient is mentating okay.  He stated that in the past patient's had a 4 L maximum a removal.  We discussed not taking more than that.  They are requesting giving some albumin to help with her blood pressure to facilitate paracentesis.  Patient status post paracentesis.  Patient's blood pressures are stabilizing.  Patient is able to open her eyes.  Hospice coordinator Olivia Mackie is at bedside and stated that patient has had a decline over the past 2 weeks and most likely from a hepatic renal issues.  Family already been contacted earlier and they did not want further labs or work-up.  The hospice coordinator also rediscussed with the family and they continue to understand that patient is declining.  They declined to speak to me and felt comfortable with patient returning home under hospice care.  See her note for more details.         Vanessa Kaanapali, MD 07/24/19 [phone]

## 2019-07-24 NOTE — Procedures (Signed)
Ultrasound-guided  therapeutic paracentesis performed yielding 4 liters of milky brown colored fluid.  No immediate complications. EBL is none. 4 Liter maximum per requesting Team.

## 2019-07-24 NOTE — ED Notes (Addendum)
Pt's daughter verbalized discharge instructions earlier with Hospice and has no questions at this time. Pt to be transported home.

## 2019-07-24 NOTE — ED Notes (Signed)
Pt checked and dry at this time. Pt resting at this time. Pt did open her eyes and acknowledged this RN

## 2019-07-24 NOTE — ED Notes (Signed)
Per Dr. Charna Archer, updated vitals are not needed; leaving only pulse ox in place.

## 2019-07-24 NOTE — ED Notes (Signed)
Received call from [place] and they stated they pulled off 4L of fluid from pt. They also states pt's BP was low  And were not able to continue.  Pt back in room at this time.

## 2019-07-24 NOTE — ED Notes (Signed)
Pt checked and dry at this time. 

## 2019-07-24 NOTE — ED Notes (Addendum)
Daughter of pt called and informed that EMS was here in ED to pick up pt. Daughter states she is there at the residence waiting on pt. DNR sent with discharge paperwork with EMS.  Daughter also requests that pt's blankets that belong to her are sent as well. Blankets sent with EMS.  Pt opened eyes once moved to stretcher. Informed pt that she was being transported home and that her daughter was there waiting on her .

## 2019-07-24 NOTE — ED Notes (Signed)
Pt to go home. Waiting on transportation for pt at this time. Pt resting comfortably.

## 2019-07-24 NOTE — Discharge Instructions (Addendum)
Patient successfully had paracentesis to help with her breathing.  Her breathing has gotten better.  She remains on hospice care for comfort during this process.

## 2019-07-24 NOTE — ED Notes (Signed)
[place] at bedside, states they received verbal consent from daughter for paracentesis.  Pt taken to [place] at this time.

## 2019-07-29 DEATH — deceased

## 2020-11-22 IMAGING — XA DG C-ARM 1-60 MIN
1 series · 1 of 1 positions shown · IV contrast (agent unspecified)
Comparison: MR lumbar spine 01/31/2019

CLINICAL DATA: L2 kyphoplasty.

EXAM:
DG C-ARM 1-60 MIN; LUMBAR SPINE - 2-3 VIEW
CONTRAST:  None.
FLUOROSCOPY TIME:  Fluoroscopy Time:  1 minutes 45 seconds
Radiation Exposure Index (if provided by the fluoroscopic device):
8.5 mGy
Number of Acquired Spot Images: 2

[Series 3: ortho standard · 1 of 1 slices shown]
[im 1/1]
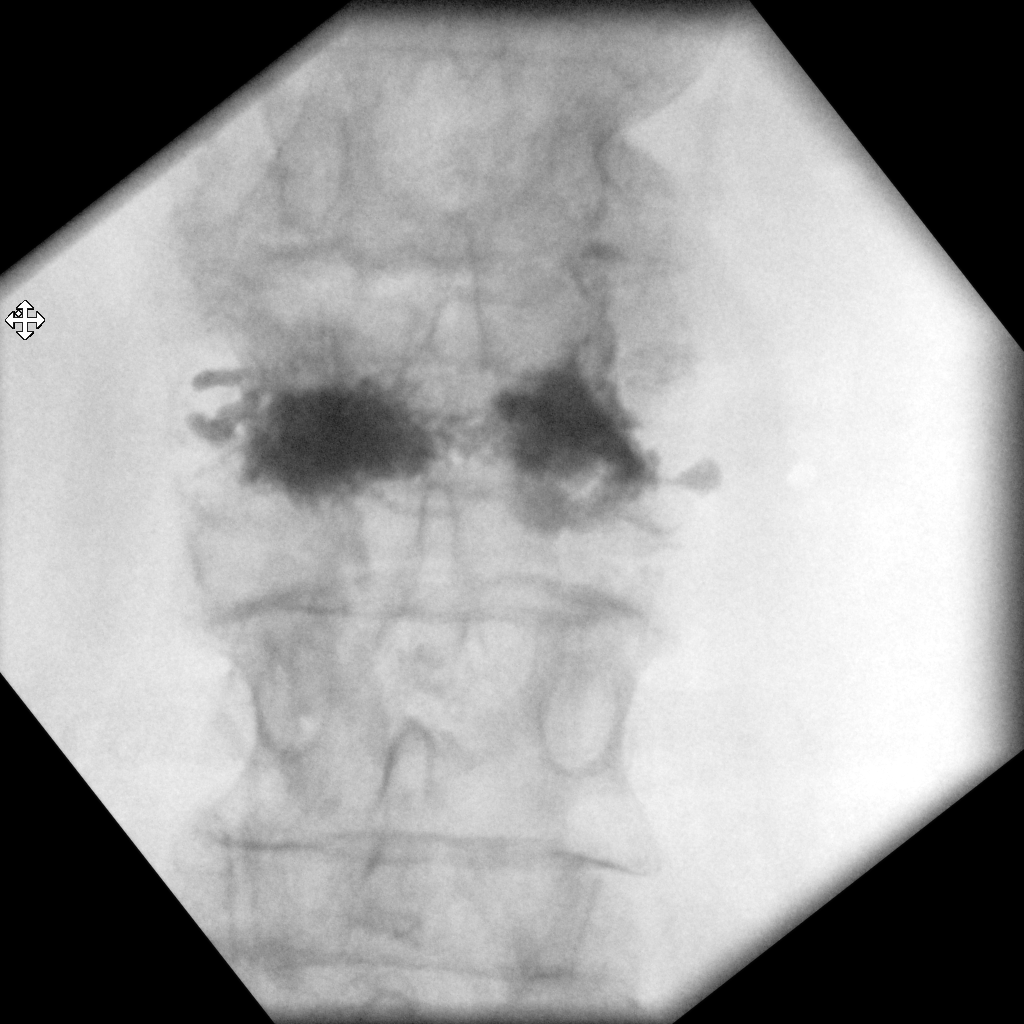

[1 of 1 positions shown; findings below may reference images not displayed]

FINDINGS: Examination demonstrates spondylosis of the lumbar spine. There is
evidence of patient's known L2 compression fracture post kyphoplasty
with radiopaque material within the confines of the L2 vertebral
body.
IMPRESSION: Findings compatible with recent L2 kyphoplasty.

## 2020-11-22 IMAGING — XA DG LUMBAR SPINE 2-3V
1 series · 1 of 1 positions shown · IV contrast (agent unspecified)
Comparison: MR lumbar spine 01/31/2019

CLINICAL DATA: L2 kyphoplasty.

EXAM:
DG C-ARM 1-60 MIN; LUMBAR SPINE - 2-3 VIEW
CONTRAST:  None.
FLUOROSCOPY TIME:  Fluoroscopy Time:  1 minutes 45 seconds
Radiation Exposure Index (if provided by the fluoroscopic device):
8.5 mGy
Number of Acquired Spot Images: 2

[Series 6001: m1 · 1 of 1 slices shown]
[im 1/1]
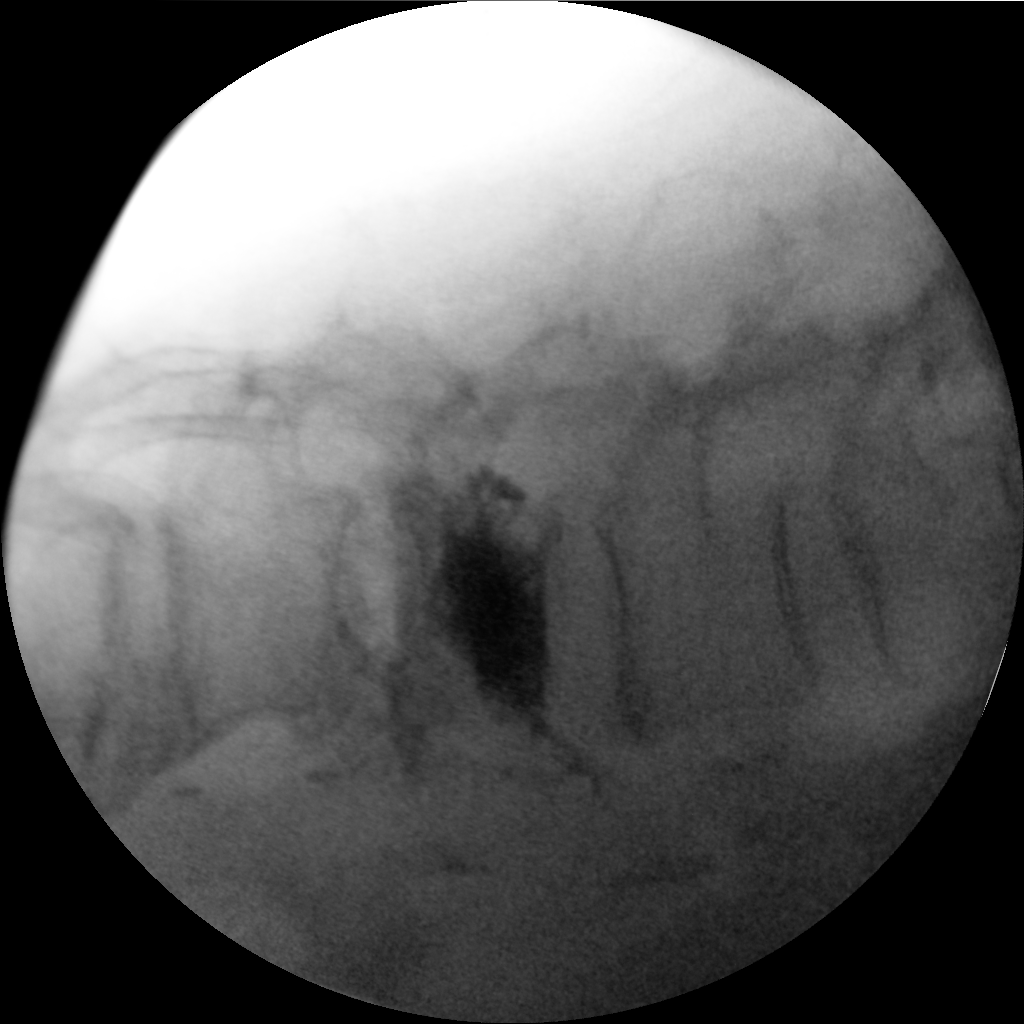

[1 of 1 positions shown; findings below may reference images not displayed]

FINDINGS: Examination demonstrates spondylosis of the lumbar spine. There is
evidence of patient's known L2 compression fracture post kyphoplasty
with radiopaque material within the confines of the L2 vertebral
body.
IMPRESSION: Findings compatible with recent L2 kyphoplasty.
